# Patient Record
Sex: Male | Born: 1955 | Race: Black or African American | Hispanic: No | Marital: Married | Smoking: Current every day smoker
Health system: Southern US, Community
[De-identification: ages and names within clinical notes are randomized; demographics above are authoritative.]

## PROBLEM LIST (undated history)

## (undated) DIAGNOSIS — G5602 Carpal tunnel syndrome, left upper limb: Secondary | ICD-10-CM

## (undated) DIAGNOSIS — T7840XA Allergy, unspecified, initial encounter: Secondary | ICD-10-CM

## (undated) DIAGNOSIS — S0990XA Unspecified injury of head, initial encounter: Secondary | ICD-10-CM

## (undated) DIAGNOSIS — K219 Gastro-esophageal reflux disease without esophagitis: Secondary | ICD-10-CM

## (undated) DIAGNOSIS — F32A Depression, unspecified: Secondary | ICD-10-CM

## (undated) DIAGNOSIS — R5383 Other fatigue: Secondary | ICD-10-CM

## (undated) DIAGNOSIS — M1990 Unspecified osteoarthritis, unspecified site: Secondary | ICD-10-CM

## (undated) DIAGNOSIS — F1910 Other psychoactive substance abuse, uncomplicated: Secondary | ICD-10-CM

## (undated) DIAGNOSIS — F329 Major depressive disorder, single episode, unspecified: Secondary | ICD-10-CM

## (undated) DIAGNOSIS — M25512 Pain in left shoulder: Secondary | ICD-10-CM

## (undated) DIAGNOSIS — I10 Essential (primary) hypertension: Secondary | ICD-10-CM

## (undated) DIAGNOSIS — S4992XA Unspecified injury of left shoulder and upper arm, initial encounter: Secondary | ICD-10-CM

## (undated) DIAGNOSIS — N189 Chronic kidney disease, unspecified: Secondary | ICD-10-CM

## (undated) HISTORY — DX: Other fatigue: R53.83

## (undated) HISTORY — DX: Gastro-esophageal reflux disease without esophagitis: K21.9

## (undated) HISTORY — DX: Carpal tunnel syndrome, left upper limb: G56.02

## (undated) HISTORY — DX: Chronic kidney disease, unspecified: N18.9

## (undated) HISTORY — DX: Unspecified injury of left shoulder and upper arm, initial encounter: S49.92XA

## (undated) HISTORY — DX: Depression, unspecified: F32.A

## (undated) HISTORY — DX: Unspecified injury of head, initial encounter: S09.90XA

## (undated) HISTORY — DX: Major depressive disorder, single episode, unspecified: F32.9

## (undated) HISTORY — DX: Allergy, unspecified, initial encounter: T78.40XA

## (undated) HISTORY — DX: Other psychoactive substance abuse, uncomplicated: F19.10

## (undated) HISTORY — DX: Pain in left shoulder: M25.512

## (undated) HISTORY — DX: Unspecified osteoarthritis, unspecified site: M19.90

## (undated) HISTORY — DX: Essential (primary) hypertension: I10

## (undated) HISTORY — PX: POLYPECTOMY: SHX149

---

## 2001-10-03 ENCOUNTER — Emergency Department (HOSPITAL_COMMUNITY): Admission: EM | Admit: 2001-10-03 | Discharge: 2001-10-03 | Payer: Self-pay | Admitting: Emergency Medicine

## 2002-11-09 ENCOUNTER — Emergency Department (HOSPITAL_COMMUNITY): Admission: EM | Admit: 2002-11-09 | Discharge: 2002-11-09 | Payer: Self-pay | Admitting: Emergency Medicine

## 2003-02-12 ENCOUNTER — Encounter: Payer: Self-pay | Admitting: Emergency Medicine

## 2003-02-12 ENCOUNTER — Emergency Department (HOSPITAL_COMMUNITY): Admission: EM | Admit: 2003-02-12 | Discharge: 2003-02-12 | Payer: Self-pay | Admitting: Emergency Medicine

## 2003-04-24 ENCOUNTER — Encounter: Admission: RE | Admit: 2003-04-24 | Discharge: 2003-04-24 | Payer: Self-pay | Admitting: Internal Medicine

## 2003-06-24 ENCOUNTER — Encounter: Admission: RE | Admit: 2003-06-24 | Discharge: 2003-06-24 | Payer: Self-pay | Admitting: Internal Medicine

## 2003-11-11 ENCOUNTER — Encounter: Admission: RE | Admit: 2003-11-11 | Discharge: 2003-11-11 | Payer: Self-pay | Admitting: Internal Medicine

## 2004-03-23 ENCOUNTER — Encounter: Admission: RE | Admit: 2004-03-23 | Discharge: 2004-03-23 | Payer: Self-pay | Admitting: Internal Medicine

## 2004-03-25 ENCOUNTER — Encounter: Admission: RE | Admit: 2004-03-25 | Discharge: 2004-03-25 | Payer: Self-pay | Admitting: Internal Medicine

## 2006-05-31 ENCOUNTER — Ambulatory Visit: Payer: Self-pay | Admitting: Hospitalist

## 2006-07-26 ENCOUNTER — Ambulatory Visit: Payer: Self-pay | Admitting: Hospitalist

## 2007-10-18 ENCOUNTER — Emergency Department (HOSPITAL_COMMUNITY): Admission: EM | Admit: 2007-10-18 | Discharge: 2007-10-18 | Payer: Self-pay | Admitting: Emergency Medicine

## 2007-10-23 ENCOUNTER — Ambulatory Visit: Payer: Self-pay | Admitting: Hospitalist

## 2007-10-23 DIAGNOSIS — K219 Gastro-esophageal reflux disease without esophagitis: Secondary | ICD-10-CM

## 2007-10-23 DIAGNOSIS — I10 Essential (primary) hypertension: Secondary | ICD-10-CM

## 2007-10-26 LAB — CONVERTED CEMR LAB
OCCULT 1: NEGATIVE
OCCULT 1: NEGATIVE

## 2007-10-27 LAB — CONVERTED CEMR LAB: OCCULT 3: NEGATIVE

## 2007-10-28 LAB — CONVERTED CEMR LAB
OCCULT 2: NEGATIVE
OCCULT 2: NEGATIVE

## 2007-10-29 ENCOUNTER — Ambulatory Visit: Payer: Self-pay | Admitting: Hospitalist

## 2008-01-17 ENCOUNTER — Ambulatory Visit: Payer: Self-pay | Admitting: Hospitalist

## 2008-01-17 DIAGNOSIS — F1910 Other psychoactive substance abuse, uncomplicated: Secondary | ICD-10-CM

## 2008-01-17 DIAGNOSIS — F329 Major depressive disorder, single episode, unspecified: Secondary | ICD-10-CM

## 2008-01-17 DIAGNOSIS — J309 Allergic rhinitis, unspecified: Secondary | ICD-10-CM | POA: Insufficient documentation

## 2008-02-25 ENCOUNTER — Telehealth (INDEPENDENT_AMBULATORY_CARE_PROVIDER_SITE_OTHER): Payer: Self-pay | Admitting: *Deleted

## 2008-02-26 ENCOUNTER — Encounter (INDEPENDENT_AMBULATORY_CARE_PROVIDER_SITE_OTHER): Payer: Self-pay | Admitting: Hospitalist

## 2008-08-25 ENCOUNTER — Encounter (INDEPENDENT_AMBULATORY_CARE_PROVIDER_SITE_OTHER): Payer: Self-pay | Admitting: *Deleted

## 2008-08-25 ENCOUNTER — Ambulatory Visit: Payer: Self-pay | Admitting: Internal Medicine

## 2008-08-26 LAB — CONVERTED CEMR LAB
ALT: 30 units/L (ref 0–53)
AST: 34 units/L (ref 0–37)
Chloride: 108 meq/L (ref 96–112)
Creatinine, Ser: 1.6 mg/dL — ABNORMAL HIGH (ref 0.40–1.50)
Total Bilirubin: 0.6 mg/dL (ref 0.3–1.2)
Total CHOL/HDL Ratio: 1.8
VLDL: 26 mg/dL (ref 0–40)

## 2008-09-21 ENCOUNTER — Ambulatory Visit: Payer: Self-pay | Admitting: Internal Medicine

## 2008-09-21 ENCOUNTER — Encounter: Payer: Self-pay | Admitting: Internal Medicine

## 2008-09-21 DIAGNOSIS — N182 Chronic kidney disease, stage 2 (mild): Secondary | ICD-10-CM | POA: Insufficient documentation

## 2008-09-24 LAB — CONVERTED CEMR LAB
Hemoglobin, Urine: NEGATIVE
Leukocytes, UA: NEGATIVE
Nitrite: NEGATIVE
PSA: 2.31 ng/mL (ref 0.10–4.00)
Potassium: 4.8 meq/L (ref 3.5–5.3)
Sodium: 141 meq/L (ref 135–145)
Specific Gravity, Urine: 1.023 (ref 1.005–1.03)
pH: 5.5 (ref 5.0–8.0)

## 2008-10-15 ENCOUNTER — Encounter: Payer: Self-pay | Admitting: Licensed Clinical Social Worker

## 2008-10-15 ENCOUNTER — Encounter (INDEPENDENT_AMBULATORY_CARE_PROVIDER_SITE_OTHER): Payer: Self-pay | Admitting: *Deleted

## 2008-10-15 ENCOUNTER — Ambulatory Visit: Payer: Self-pay | Admitting: *Deleted

## 2008-10-15 ENCOUNTER — Encounter (INDEPENDENT_AMBULATORY_CARE_PROVIDER_SITE_OTHER): Payer: Self-pay | Admitting: Internal Medicine

## 2008-10-15 DIAGNOSIS — M25519 Pain in unspecified shoulder: Secondary | ICD-10-CM

## 2008-10-15 LAB — CONVERTED CEMR LAB: Calcium, Total (PTH): 10 mg/dL (ref 8.4–10.5)

## 2008-10-19 LAB — CONVERTED CEMR LAB
Anti Nuclear Antibody(ANA): NEGATIVE
BUN: 16 mg/dL (ref 6–23)
Benzodiazepines.: NEGATIVE
CO2: 25 meq/L (ref 19–32)
Creatinine, Ser: 1.56 mg/dL — ABNORMAL HIGH (ref 0.40–1.50)
Creatinine,U: 319.3 mg/dL
HCT: 45.8 % (ref 39.0–52.0)
MCHC: 32.5 g/dL (ref 30.0–36.0)
MCV: 86.6 fL (ref 78.0–100.0)
Microalb, Ur: 1.14 mg/dL (ref 0.00–1.89)
Nitrite: NEGATIVE
Phencyclidine (PCP): NEGATIVE
Propoxyphene: NEGATIVE
RBC: 5.29 M/uL (ref 4.22–5.81)
Sed Rate: 9 mm/hr (ref 0–16)
Specific Gravity, Urine: 1.024 (ref 1.005–1.03)
Urobilinogen, UA: 0.2 (ref 0.0–1.0)
Vit D, 1,25-Dihydroxy: 23 — ABNORMAL LOW (ref 30–89)
pH: 5.5 (ref 5.0–8.0)

## 2009-01-05 ENCOUNTER — Ambulatory Visit: Payer: Self-pay | Admitting: Internal Medicine

## 2009-01-19 ENCOUNTER — Ambulatory Visit (HOSPITAL_COMMUNITY): Admission: RE | Admit: 2009-01-19 | Discharge: 2009-01-19 | Payer: Self-pay | Admitting: *Deleted

## 2009-01-22 ENCOUNTER — Ambulatory Visit: Payer: Self-pay | Admitting: Internal Medicine

## 2009-04-27 ENCOUNTER — Ambulatory Visit: Payer: Self-pay | Admitting: Infectious Disease

## 2009-04-27 DIAGNOSIS — G5600 Carpal tunnel syndrome, unspecified upper limb: Secondary | ICD-10-CM

## 2009-05-17 ENCOUNTER — Encounter (INDEPENDENT_AMBULATORY_CARE_PROVIDER_SITE_OTHER): Payer: Self-pay | Admitting: *Deleted

## 2009-05-24 ENCOUNTER — Encounter (INDEPENDENT_AMBULATORY_CARE_PROVIDER_SITE_OTHER): Payer: Self-pay | Admitting: *Deleted

## 2009-05-28 ENCOUNTER — Encounter (INDEPENDENT_AMBULATORY_CARE_PROVIDER_SITE_OTHER): Payer: Self-pay | Admitting: *Deleted

## 2009-05-28 ENCOUNTER — Ambulatory Visit: Payer: Self-pay | Admitting: Internal Medicine

## 2009-05-28 LAB — CONVERTED CEMR LAB
GFR calc Af Amer: >60 mL/min (ref >60)
GFR calc non Af Amer: 57 mL/min — ABNORMAL LOW (ref >60)
Potassium: 4.8 meq/L (ref 3.5–5.3)
Sodium: 139 meq/L (ref 135–145)

## 2009-06-07 ENCOUNTER — Ambulatory Visit: Payer: Self-pay | Admitting: Family Medicine

## 2009-06-08 ENCOUNTER — Encounter: Payer: Self-pay | Admitting: Sports Medicine

## 2009-06-28 ENCOUNTER — Telehealth: Payer: Self-pay | Admitting: Internal Medicine

## 2009-07-05 ENCOUNTER — Ambulatory Visit: Payer: Self-pay | Admitting: Family Medicine

## 2009-07-06 ENCOUNTER — Telehealth: Payer: Self-pay | Admitting: Family Medicine

## 2009-07-08 ENCOUNTER — Ambulatory Visit: Payer: Self-pay | Admitting: Internal Medicine

## 2009-07-08 ENCOUNTER — Encounter: Payer: Self-pay | Admitting: Internal Medicine

## 2009-07-08 LAB — CONVERTED CEMR LAB
Chloride: 105 meq/L (ref 96–112)
Creatinine, Ser: 1.29 mg/dL (ref 0.40–1.50)
HDL: 63 mg/dL (ref >39)
LDL Cholesterol: 96 mg/dL (ref 0–99)
Potassium: 5.1 meq/L (ref 3.5–5.3)
Sodium: 139 meq/L (ref 135–145)
Total CHOL/HDL Ratio: 2.8
Triglycerides: 98 mg/dL (ref <150)

## 2009-07-30 ENCOUNTER — Ambulatory Visit: Payer: Self-pay | Admitting: Gastroenterology

## 2009-07-30 ENCOUNTER — Encounter: Payer: Self-pay | Admitting: Internal Medicine

## 2009-08-02 ENCOUNTER — Ambulatory Visit: Payer: Self-pay | Admitting: Family Medicine

## 2009-08-04 ENCOUNTER — Telehealth: Payer: Self-pay | Admitting: *Deleted

## 2009-08-09 ENCOUNTER — Telehealth: Payer: Self-pay | Admitting: Gastroenterology

## 2009-08-11 ENCOUNTER — Ambulatory Visit: Payer: Self-pay | Admitting: Gastroenterology

## 2009-08-11 ENCOUNTER — Encounter: Payer: Self-pay | Admitting: Gastroenterology

## 2009-08-11 HISTORY — PX: COLONOSCOPY: SHX174

## 2009-08-11 LAB — HM COLONOSCOPY

## 2009-08-13 ENCOUNTER — Encounter: Payer: Self-pay | Admitting: Gastroenterology

## 2009-08-25 ENCOUNTER — Ambulatory Visit: Payer: Self-pay | Admitting: Internal Medicine

## 2009-08-25 DIAGNOSIS — M549 Dorsalgia, unspecified: Secondary | ICD-10-CM | POA: Insufficient documentation

## 2009-08-25 LAB — CONVERTED CEMR LAB
CO2: 23 meq/L (ref 19–32)
Calcium: 9.6 mg/dL (ref 8.4–10.5)
Creatinine, Ser: 1.19 mg/dL (ref 0.40–1.50)

## 2009-10-28 ENCOUNTER — Ambulatory Visit: Payer: Self-pay | Admitting: Internal Medicine

## 2010-01-18 ENCOUNTER — Ambulatory Visit: Payer: Self-pay | Admitting: Internal Medicine

## 2010-01-18 LAB — CONVERTED CEMR LAB
ALT: 10 units/L (ref 0–53)
AST: 16 units/L (ref 0–37)
Calcium: 9.8 mg/dL (ref 8.4–10.5)
Chloride: 105 meq/L (ref 96–112)
Creatinine, Ser: 1.24 mg/dL (ref 0.40–1.50)
Total Bilirubin: 0.5 mg/dL (ref 0.3–1.2)

## 2010-02-01 ENCOUNTER — Ambulatory Visit: Payer: Self-pay | Admitting: Internal Medicine

## 2010-02-01 DIAGNOSIS — K029 Dental caries, unspecified: Secondary | ICD-10-CM | POA: Insufficient documentation

## 2010-03-21 ENCOUNTER — Telehealth: Payer: Self-pay | Admitting: Internal Medicine

## 2010-03-29 ENCOUNTER — Telehealth: Payer: Self-pay | Admitting: Internal Medicine

## 2010-06-15 ENCOUNTER — Ambulatory Visit: Payer: Self-pay | Admitting: Internal Medicine

## 2010-08-01 IMAGING — US US RENAL
1 series · 14 of 25 positions shown · non-contrast
Comparison: 10/18/2007

CLINICAL DATA: Renal insufficiency

RENAL/URINARY TRACT ULTRASOUND
TECHNIQUE: Complete ultrasound examination of the urinary tract
was performed including evaluation of the kidneys, renal collecting
systems, and urinary bladder.

[Series 1: unknown · 0.32mm/px · 14 of 25 slices shown]
[im 1/25]
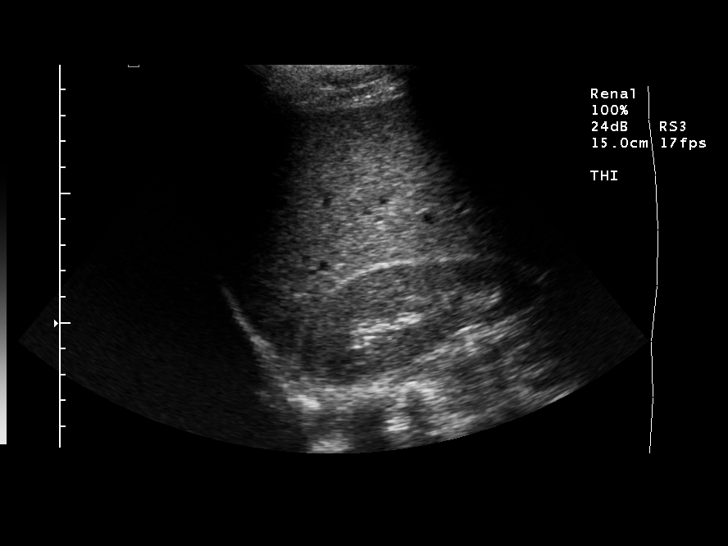
[im 3/25]
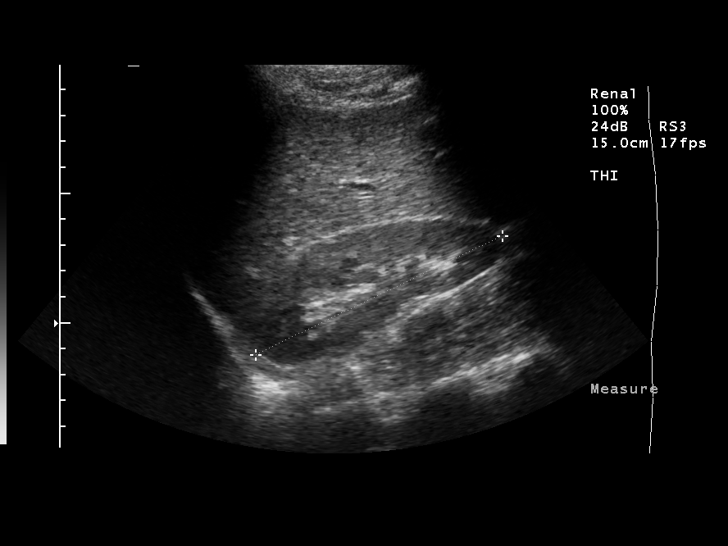
[im 5/25]
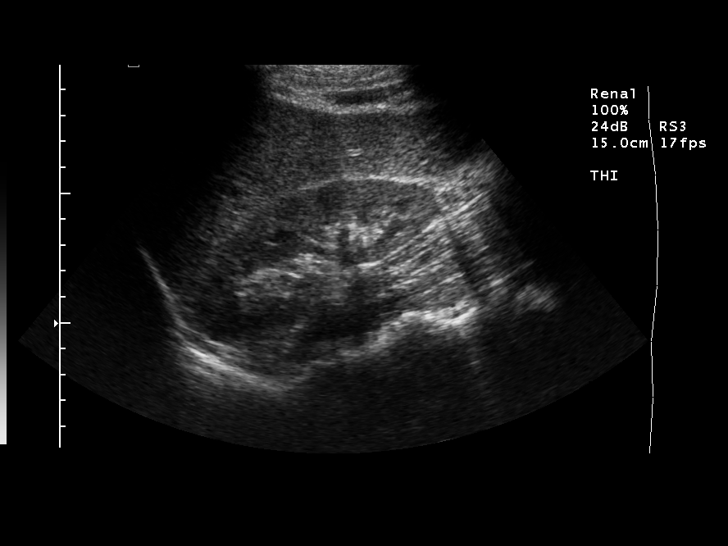
[im 7/25]
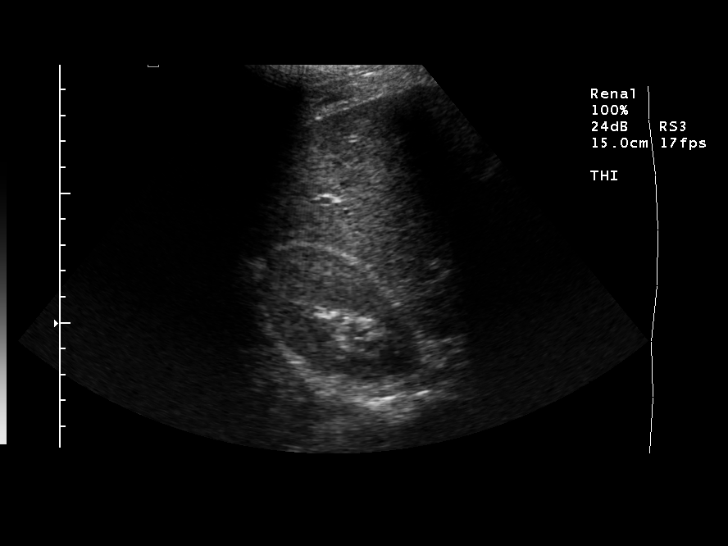
[im 9/25]
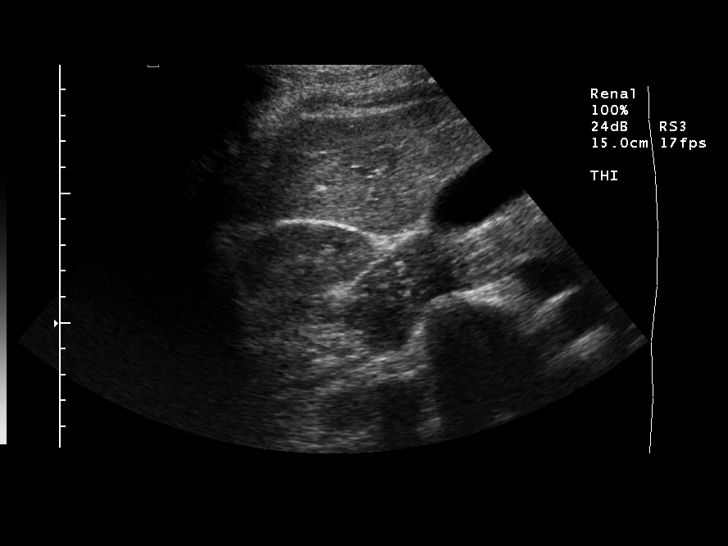
[im 10/25]
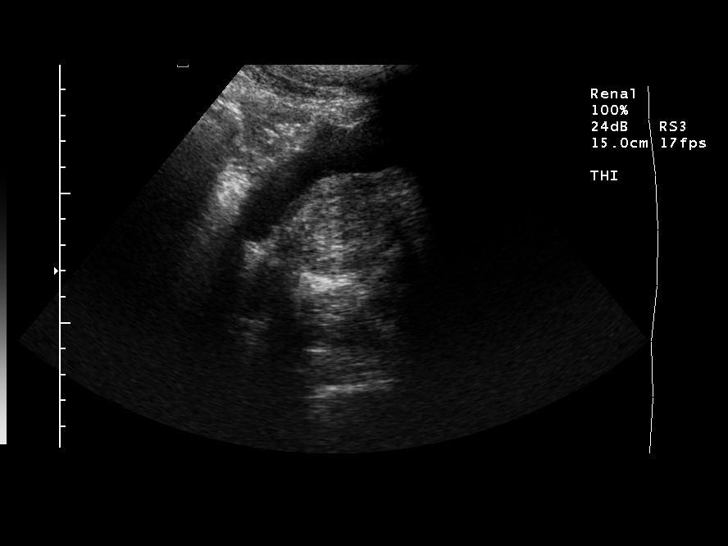
[im 12/25]
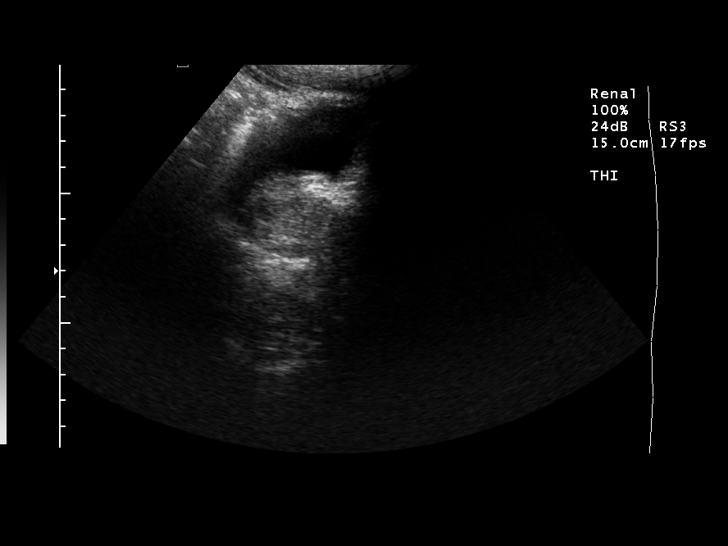
[im 14/25]
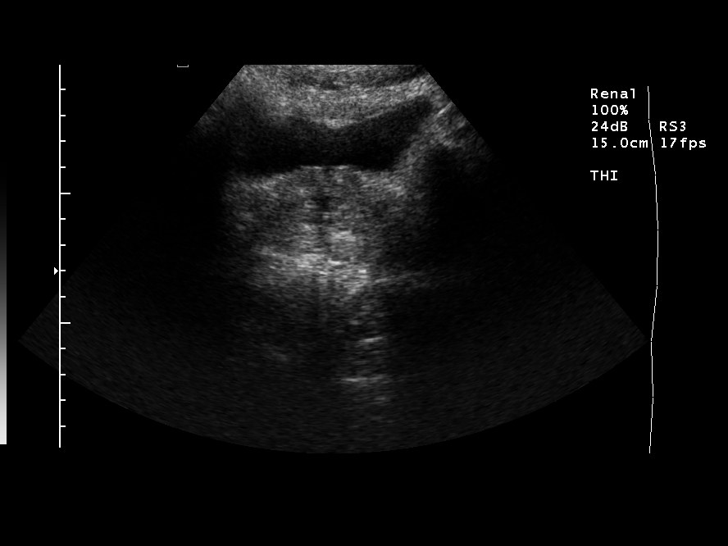
[im 16/25]
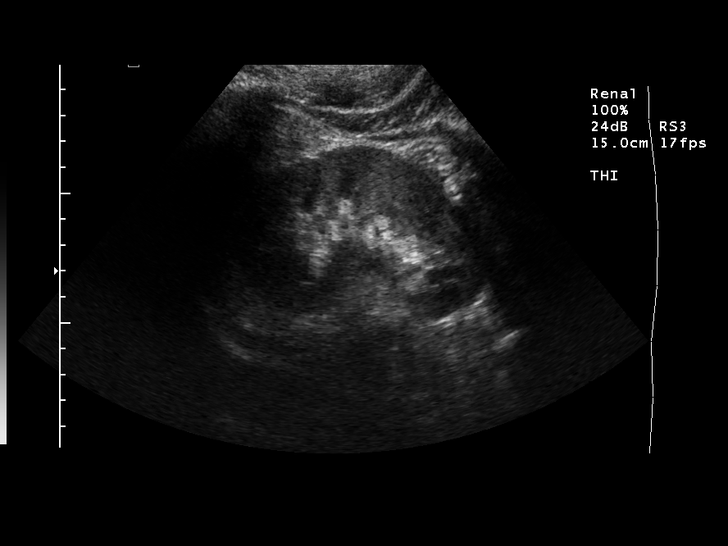
[im 17/25]
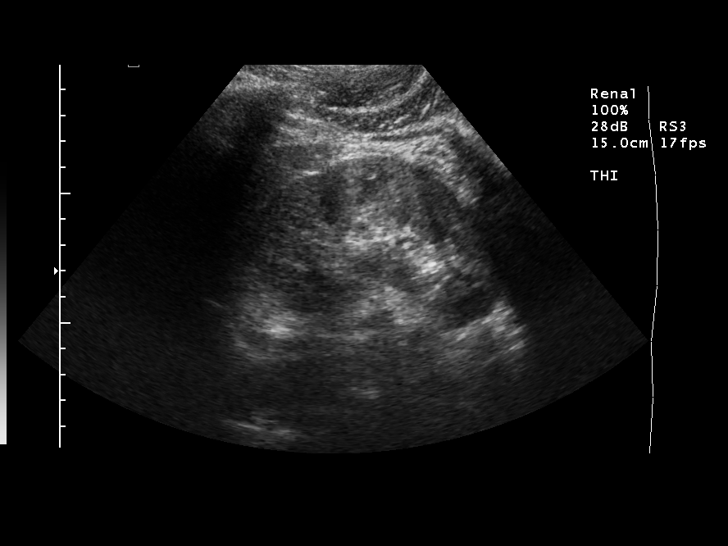
[im 19/25]
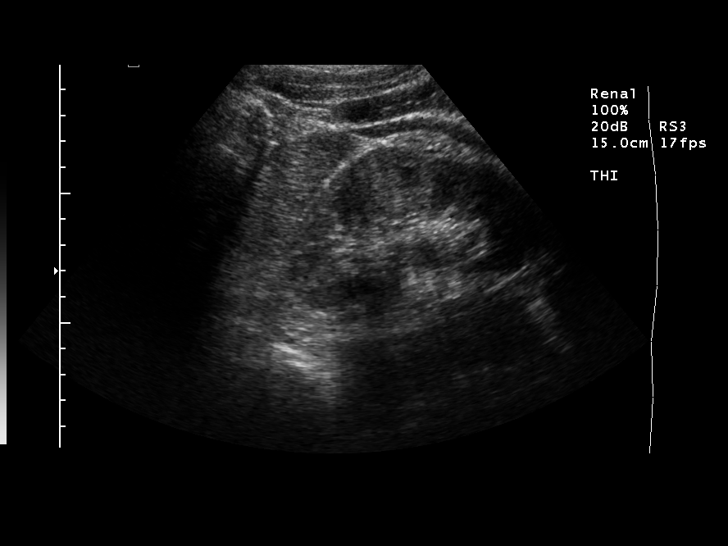
[im 21/25]
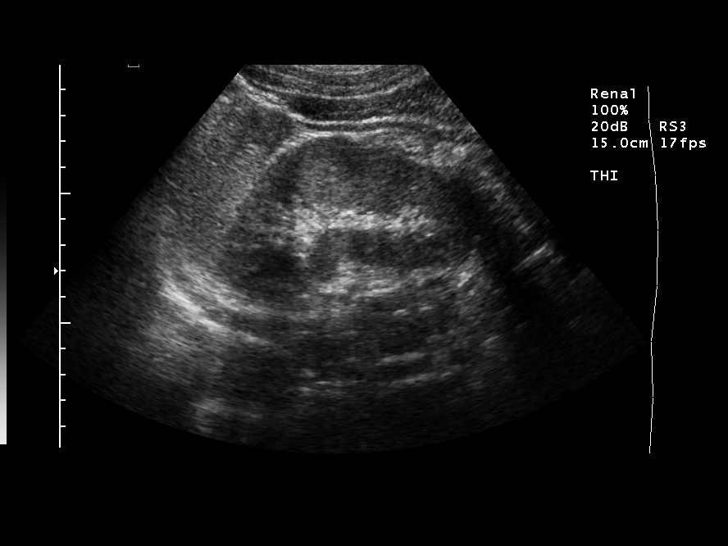
[im 23/25]
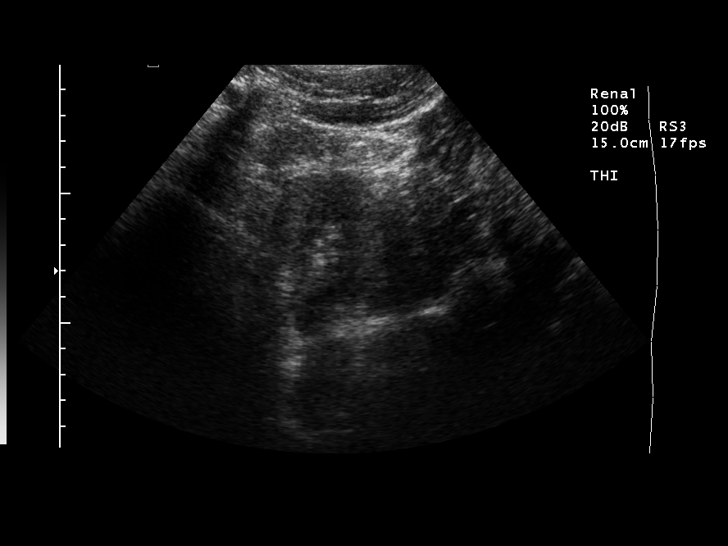
[im 25/25]
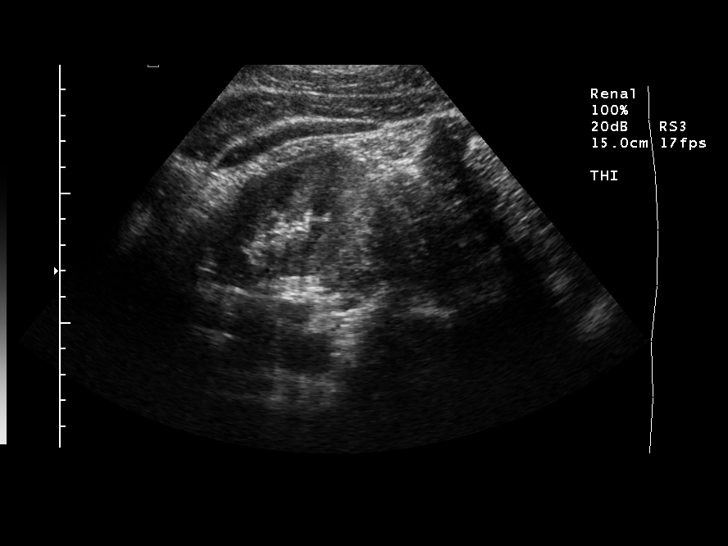

[14 of 25 positions shown; findings below may reference images not displayed]

FINDINGS: The right kidney measures 10.7 cm.  The cortex appears
normal There is no hydronephrosis.

The left kidney measures 10.6 cm.  The cortex appears normal.

 There is no hydronephrosis.

The urinary bladder is normal.

There is prostate gland enlargement.
IMPRESSION: 1.  No evidence of obstructive uropathy to explain the patient's
renal insufficiency

## 2010-09-12 ENCOUNTER — Telehealth: Payer: Self-pay | Admitting: Internal Medicine

## 2010-10-18 ENCOUNTER — Ambulatory Visit: Payer: Self-pay | Admitting: Internal Medicine
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
## 2011-01-10 NOTE — Progress Notes (Signed)
Summary: Refill/gh  Phone Note Refill Request Message from:  Fax from Pharmacy on March 29, 2010 2:16 PM  Refills Requested: Medication #1:  CITALOPRAM HYDROBROMIDE 20 MG TABS Take 1 tablet by mouth once a day   Dosage confirmed as above?Dosage Confirmed   Last Refilled: 02/26/2010  Method Requested: Fax to Local Pharmacy Initial call taken by: Angelina Ok RN,  March 29, 2010 2:16 PM    Prescriptions: CITALOPRAM HYDROBROMIDE 20 MG TABS (CITALOPRAM HYDROBROMIDE) Take 1 tablet by mouth once a day  #30 x 5   Entered and Authorized by:   [name]   Signed by:   [name] on 03/30/2010   Method used:   Faxed to ...       [facility] (retail)       [address] [place], [place]  [address]       Ph: [phone]       Fax: [phone]   RxID:   [id number]

## 2011-01-10 NOTE — Progress Notes (Signed)
Summary: Refill/gh  Phone Note Refill Request Message from:  Fax from Pharmacy on September 12, 2010 11:23 AM  Refills Requested: Medication #1:  GABAPENTIN 300 MG CAPS taper up to 2 in am   Dosage confirmed as above?Dosage Confirmed   Last Refilled: 03/27/2010  Method Requested: Electronic Initial call taken by: Angelina Ok RN,  September 12, 2010 11:23 AM  Follow-up for Phone Call        Rx completed in Dr. Tiajuana Amass Follow-up by: [name],  September 12, 2010 12:38 PM    Prescriptions: GABAPENTIN 300 MG CAPS (GABAPENTIN) taper up to 2 in am, 2 at lunch and 2 at night patient has written taper  #180 x 11   Entered and Authorized by:   [name]   Signed by:   [name] on 09/12/2010   Method used:   Faxed to ...       [facility] (retail)       [address] [place], [place]  [address]       Ph: [phone]       Fax: [phone]   RxID:   [id number]
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
## 2011-01-10 NOTE — Progress Notes (Signed)
Summary: refill/gg  Phone Note Refill Request  on March 21, 2010 1:50 PM  Refills Requested: Medication #1:  ACCUPRIL 10 MG TABS Take 1 tablet by mouth once a day   Dosage confirmed as above?Dosage Confirmed   Last Refilled: 12/14/2009  Method Requested: Fax to Local Pharmacy Initial call taken by: Merrie Roof RN,  March 21, 2010 1:50 PM    Prescriptions: ACCUPRIL 10 MG TABS (QUINAPRIL HCL) Take 1 tablet by mouth once a day  #30 x 11   Entered and Authorized by:   [name]   Signed by:   [name] on 03/21/2010   Method used:   Faxed to ...       [facility] (retail)       [address] [place], [place]  [address]       Ph: [phone]       Fax: [phone]   RxID:   [id number]

## 2011-02-14 ENCOUNTER — Encounter: Payer: Self-pay | Admitting: Internal Medicine

## 2011-02-14 ENCOUNTER — Ambulatory Visit (INDEPENDENT_AMBULATORY_CARE_PROVIDER_SITE_OTHER): Payer: Self-pay | Admitting: Internal Medicine

## 2011-02-14 DIAGNOSIS — I10 Essential (primary) hypertension: Secondary | ICD-10-CM

## 2011-02-14 DIAGNOSIS — H538 Other visual disturbances: Secondary | ICD-10-CM

## 2011-02-14 DIAGNOSIS — N259 Disorder resulting from impaired renal tubular function, unspecified: Secondary | ICD-10-CM

## 2011-02-14 DIAGNOSIS — G5600 Carpal tunnel syndrome, unspecified upper limb: Secondary | ICD-10-CM

## 2011-02-14 LAB — BASIC METABOLIC PANEL
Chloride: 105 mEq/L (ref 96–112)
Potassium: 4.2 mEq/L (ref 3.5–5.3)

## 2011-02-14 MED ORDER — TRAMADOL HCL 50 MG PO TABS: 50.0000 mg | ORAL_TABLET | Freq: Four times a day (QID) | ORAL | Status: DC | PRN

## 2011-02-14 MED ORDER — ACETAMINOPHEN ER 650 MG PO TBCR: 650.0000 mg | EXTENDED_RELEASE_TABLET | Freq: Three times a day (TID) | ORAL | Status: DC | PRN

## 2011-02-14 NOTE — Assessment & Plan Note (Addendum)
Likely due to unfit glasses and he has no other eye symptoms. Will have eye referral to help him to get the right glasses or find out other eye problems.

## 2011-02-14 NOTE — Patient Instructions (Signed)
Please continue to use wrist brace to help your left wrist and take pain medications.  Please stop smoking and it will help your cough and health.

## 2011-02-14 NOTE — Progress Notes (Signed)
  Subjective:    Patient ID: Derek Patterson, male    DOB: [birth year], 55 y.o.   MRN: [id number]  HPI Pt is a 55 y/o AAM with PMH of HTN, carpal tunnel syndrome and CKD who came here for regular follow-up and wants refill and eye referral. Recently he found his glasses not quite comfortable and seems blurry, no HA, wants to see eye doc for his glasses.  He still has left hand numbness and pain from carpal tunnel syndrome and has seen sports medicine and was given steroid shot once and advised him to [facility] for surgery and pt refused for surgery. The symptom is better when he wears wrist brace. He has not taken tramadol or tylenol for several months and needs refill. He also has dry cough for several months after the cold and smokes 1/2 PPD currently. No fever, hemoptysis.  cough and depression. No other c/o, such as melena or abdominal pain, SOB, CP.     Review of Systems  Constitutional: Negative for fever and activity change.  HENT: Negative for nosebleeds and neck pain.   Eyes: Negative for pain.  Respiratory: Positive for cough. Negative for apnea, chest tightness and wheezing.   Cardiovascular: Negative for chest pain, palpitations and leg swelling.  Gastrointestinal: Negative for abdominal distention.  Genitourinary: Negative for dysuria and hematuria.  Musculoskeletal: Positive for myalgias.  Neurological: Negative for dizziness, weakness, numbness and headaches.       Objective:   Physical Exam  Constitutional: He is oriented to person, place, and time. He appears well-developed and well-nourished.  HENT:  Head: Normocephalic and atraumatic.  Eyes: Conjunctivae and EOM are normal. Pupils are equal, round, and reactive to light.  Neck: Normal range of motion. Neck supple. No JVD present.  Cardiovascular: Normal rate, regular rhythm, normal heart sounds and intact distal pulses.   No murmur heard. Pulmonary/Chest: Effort normal and breath sounds normal. No respiratory distress. He has  no wheezes. He exhibits no tenderness.  Abdominal: Soft. Bowel sounds are normal. He exhibits no distension. There is no tenderness. There is no rebound and no guarding.  Musculoskeletal: Normal range of motion. He exhibits no edema and no tenderness.       Left hand radial side of his fingers has decreased sensation.   Lymphadenopathy:    He has no cervical adenopathy.  Neurological: He is alert and oriented to person, place, and time. He has normal reflexes.          Assessment & Plan:

## 2011-02-14 NOTE — Assessment & Plan Note (Signed)
BP well controlled with ACEIs, and will check BMET. Recheck BP at next visit.

## 2011-02-14 NOTE — Assessment & Plan Note (Addendum)
His Cr baseline 1.2-1.5 and on ACEI now, will recheck BMET for Cr since this has not been checked for one year.

## 2011-02-14 NOTE — Assessment & Plan Note (Addendum)
He has seen sports medicine and was given steroid shot once, but did not help much for his pain, so they advised him to [facility] for surgery, but pt refused for surgery. He has not taken tramadol or tylenol for several months and needs refill. better when he wears wrist brace. Neurontin also did not help too much. Will stop neurontin and refill tramadol and tylenol. Advised him to use wrist brace.

## 2011-02-20 ENCOUNTER — Encounter: Payer: Self-pay | Admitting: *Deleted

## 2011-03-24 ENCOUNTER — Other Ambulatory Visit: Payer: Self-pay | Admitting: *Deleted

## 2011-03-24 DIAGNOSIS — I10 Essential (primary) hypertension: Secondary | ICD-10-CM

## 2011-03-24 MED ORDER — QUINAPRIL HCL 10 MG PO TABS: 10.0000 mg | ORAL_TABLET | Freq: Every day | ORAL | Status: DC

## 2011-07-01 ENCOUNTER — Encounter: Payer: Self-pay | Admitting: Internal Medicine

## 2011-07-01 ENCOUNTER — Encounter: Payer: Self-pay | Admitting: *Deleted

## 2011-07-13 ENCOUNTER — Ambulatory Visit (INDEPENDENT_AMBULATORY_CARE_PROVIDER_SITE_OTHER): Payer: Self-pay | Admitting: Internal Medicine

## 2011-07-13 ENCOUNTER — Encounter: Payer: Self-pay | Admitting: Internal Medicine

## 2011-07-13 DIAGNOSIS — Z720 Tobacco use: Secondary | ICD-10-CM

## 2011-07-13 DIAGNOSIS — F17200 Nicotine dependence, unspecified, uncomplicated: Secondary | ICD-10-CM

## 2011-07-13 DIAGNOSIS — K219 Gastro-esophageal reflux disease without esophagitis: Secondary | ICD-10-CM

## 2011-07-13 DIAGNOSIS — I10 Essential (primary) hypertension: Secondary | ICD-10-CM

## 2011-07-13 MED ORDER — FAMOTIDINE 20 MG PO TABS: 20.0000 mg | ORAL_TABLET | Freq: Two times a day (BID) | ORAL | Status: DC

## 2011-07-13 MED ORDER — HYDROCHLOROTHIAZIDE 25 MG PO TABS: 25.0000 mg | ORAL_TABLET | Freq: Every day | ORAL | Status: DC

## 2011-07-13 NOTE — Patient Instructions (Signed)
You were seen today for a cough, and we discussed that it might be caused by your smoking or acid reflux. We will switch your blood pressure medicine to one that is more affordable. Stop taking accupril. Start taking HCTZ (hydrochlorothiazide) 25 mg daily and we will see you back in 2-3 weeks to check that there are no side effects and your blood pressure is still well controlled. Also we are prescribing Pepcid 20 mg to be take twice daily to help your acid reflux. We also discussed smoking and talked about quitting. We will have you talk to Gilford Silvius who counsels people about smoking cessation. As well, calling 1800QUITNOW can help you to get counseling and possibly free resources such as patches to help you quit.  Smoking Cessation This document explains the best ways for you to quit smoking and new treatments to help. It lists new medicines that can double or triple your chances of quitting and quitting for good. It also considers ways to avoid relapses and concerns you may have about quitting, including weight gain. NICOTINE: A POWERFUL ADDICTION If you have tried to quit smoking, you know how hard it can be. It is hard because nicotine is a very addictive drug. For some people, it can be as addictive as heroin or cocaine. Usually, people make 2 or 3 tries, or more, before finally being able to quit. Each time you try to quit, you can learn about what helps and what hurts. Quitting takes hard work and a lot of effort, but you can quit smoking. QUITTING SMOKING IS ONE OF THE MOST IMPORTANT THINGS YOU WILL EVER DO:  You will live longer, feel better, and live better.   The impact on your body of quitting smoking is felt almost immediately:   Within 20 minutes, blood pressure decreases. Pulse returns to its normal level.   After 8 hours, carbon monoxide levels in the blood return to normal. Oxygen level increases.   After 24 hours, chance of heart attack starts to decrease. Breath, hair, and  body stop smelling like smoke.   After 48 hours, damaged nerve endings begin to recover. Sense of taste and smell improve.   After 72 hours, the body is virtually free of nicotine. Bronchial tubes relax and breathing becomes easier.   After 2 to 12 weeks, lungs can hold more air. Exercise becomes easier and circulation improves.   Quitting will lower your chance of having a heart attack, stroke, cancer, or lung disease:   After 1 year, the risk of coronary heart disease is cut in half.   After 5 years, the risk of stroke falls to the same as a nonsmoker.   After 10 years, the risk of lung cancer is cut in half and the risk of other cancers decreases significantly.   After 15 years, the risk of coronary heart disease drops, usually to the level of a nonsmoker.   If you are pregnant, quitting smoking will improve your chances of having a healthy baby.   The people you live with, especially your children, will be healthier.   You will have extra money to spend on things other than cigarettes.  FIVE KEYS TO QUITTING Studies have shown that these 5 steps will help you quit smoking and quit for good. You have the best chances of quitting if you use them together: 1. Get ready.  2. Get support and encouragement.  3. Learn new skills and behaviors.  4. Get medicine to reduce your nicotine addiction  and use it correctly.  5. Be prepared for relapse or difficult situations. Be determined to continue trying to quit, even if you do not succeed at first.  1. GET READY  Set a quit date.   Change your environment.   Get rid of ALL cigarettes, ashtrays, matches, and lighters in your home, car, and place of work.   Do not let people smoke in your home.   Review your past attempts to quit. Think about what worked and what did not.   Once you quit, do not smoke. NOT EVEN A PUFF!  2. GET SUPPORT AND ENCOURAGEMENT Studies have shown that you have a better chance of being successful if you have  help. You can get support in many ways.  Tell your family, friends, and coworkers that you are going to quit and need their support. Ask them not to smoke around you.   Talk to your caregivers (doctor, dentist, nurse, pharmacist, psychologist, and/or smoking counselor).   Get individual, group, or telephone counseling and support. The more counseling you have, the better your chances are of quitting. Programs are available at [employer] and health centers. Call your local health department for information about programs in your area.   Spiritual beliefs and practices may help some smokers quit.   Quit meters are Photographer that keep track of quit statistics, such as amount of "quit-time," cigarettes not smoked, and money saved.   Many smokers find one or more of the many self-help books available useful in helping them quit and stay off tobacco.  3. LEARN NEW SKILLS AND BEHAVIORS  Try to distract yourself from urges to smoke. Talk to someone, go for a walk, or occupy your time with a task.   When you first try to quit, change your routine. Take a different route to work. Drink tea instead of coffee. Eat breakfast in a different place.   Do something to reduce your stress. Take a hot bath, exercise, or read a book.   Plan something enjoyable to do every day. Reward yourself for not smoking.   Explore interactive web-based programs that specialize in helping you quit.  4. GET MEDICINE AND USE IT CORRECTLY Medicines can help you stop smoking and decrease the urge to smoke. Combining medicine with the above behavioral methods and support can quadruple your chances of successfully quitting smoking. The [place] Food and Drug Administration (FDA) has approved 7 medicines to help you quit smoking. These medicines fall into 3 categories.  Nicotine replacement therapy (delivers nicotine to your body without the negative effects and risks of smoking):    Nicotine gum: Available over-the-counter.   Nicotine lozenges: Available over-the-counter.   Nicotine inhaler: Available by prescription.   Nicotine nasal spray: Available by prescription.   Nicotine skin patches (transdermal): Available by prescription and over-the-counter.   Antidepressant medicine (helps people abstain from smoking, but how this works is unknown):   Bupropion sustained-release (SR) tablets: Available by prescription.   Nicotinic receptor partial agonist (simulates the effect of nicotine in your brain):   Varenicline tartrate tablets: Available by prescription.   Ask your caregiver for advice about which medicines to use and how to use them. Carefully read the information on the package.   Everyone who is trying to quit may benefit from using a medicine. If you are pregnant or trying to become pregnant, nursing an infant, you are under age 56, or you smoke fewer than 10 cigarettes per day, talk to  your caregiver before taking any nicotine replacement medicines.   You should stop using a nicotine replacement product and call your caregiver if you experience nausea, dizziness, weakness, vomiting, fast or irregular heartbeat, mouth problems with the lozenge or gum, or redness or swelling of the skin around the patch that does not go away.   Do not use any other product containing nicotine while using a nicotine replacement product.   Talk to your caregiver before using these products if you have diabetes, heart disease, asthma, stomach ulcers, you had a recent heart attack, you have high blood pressure that is not controlled with medicine, a history of irregular heartbeat, or you have been prescribed medicine to help you quit smoking.  5. BE PREPARED FOR RELAPSE OR DIFFICULT SITUATIONS  Most relapses occur within the first 3 months after quitting. Do not be discouraged if you start smoking again. Remember, most people try several times before they finally quit.    You may have symptoms of withdrawal because your body is used to nicotine. You may crave cigarettes, be irritable, feel very hungry, cough often, get headaches, or have difficulty concentrating.   The withdrawal symptoms are only temporary. They are strongest when you first quit, but they will go away within 10 to 14 days.  Here are some difficult situations to watch for:  Alcohol. Avoid drinking alcohol. Drinking lowers your chances of successfully quitting.   Caffeine. Try to reduce the amount of caffeine you consume. It also lowers your chances of successfully quitting.   Other smokers. Being around smoking can make you want to smoke. Avoid smokers.   Weight gain. Many smokers will gain weight when they quit, usually less than 10 pounds. Eat a healthy diet and stay active. Do not let weight gain distract you from your main goal, quitting smoking. Some medicines that help you quit smoking may also help delay weight gain. You can always lose the weight gained after you quit.   Bad mood or depression. There are a lot of ways to improve your mood other than smoking.  If you are having problems with any of these situations, talk to your caregiver. SPECIAL SITUATIONS OR CONDITIONS Studies suggest that everyone can quit smoking. Your situation or condition can give you a special reason to quit.  Pregnant women/New mothers: By quitting, you protect your baby's health and your own.   Hospitalized patients: By quitting, you reduce health problems and help healing.   Heart attack patients: By quitting, you reduce your risk of a second heart attack.   Lung, head, and neck cancer patients: By quitting, you reduce your chance of a second cancer.   Parents of children and adolescents: By quitting, you protect your children from illnesses caused by secondhand smoke.  QUESTIONS TO THINK ABOUT Think about the following questions before you try to stop smoking. You may want to talk about your answers  with your caregiver.  Why do you want to quit?   If you tried to quit in the past, what helped and what did not?   What will be the most difficult situations for you after you quit? How will you plan to handle them?   Who can help you through the tough times? Your family? Friends? Caregiver?   What pleasures do you get from smoking? What ways can you still get pleasure if you quit?  Here are some questions to ask your caregiver:  How can you help me to be successful at quitting?  What medicine do you think would be best for me and how should I take it?   What should I do if I need more help?   What is smoking withdrawal like? How can I get information on withdrawal?  Quitting takes hard work and a lot of effort, but you can quit smoking. FOR MORE INFORMATION Smokefree.gov (http://www.davis-sullivan.com/) provides free, accurate, evidence-based information and professional assistance to help support the immediate and long-term needs of people trying to quit smoking. Document Released: 11/21/2001 Document Re-Released: 05/17/2010 [facility] Patient Information 2011 [place], [place].
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
## 2011-07-14 NOTE — Progress Notes (Signed)
Dr Dorise Hiss discussed Mr Enck with me and I agree with her note and plans for his treatment.

## 2011-07-17 ENCOUNTER — Telehealth: Payer: Self-pay | Admitting: Licensed Clinical Social Worker

## 2011-07-27 ENCOUNTER — Telehealth: Payer: Self-pay | Admitting: Licensed Clinical Social Worker

## 2011-07-28 ENCOUNTER — Encounter: Payer: Self-pay | Admitting: Licensed Clinical Social Worker

## 2011-07-28 NOTE — Telephone Encounter (Signed)
Mailed letter to patient home asking him to call SW.

## 2011-08-03 ENCOUNTER — Encounter: Payer: Self-pay | Admitting: Internal Medicine

## 2011-08-03 ENCOUNTER — Ambulatory Visit (INDEPENDENT_AMBULATORY_CARE_PROVIDER_SITE_OTHER): Payer: Self-pay | Admitting: Internal Medicine

## 2011-08-03 DIAGNOSIS — I10 Essential (primary) hypertension: Secondary | ICD-10-CM

## 2011-08-03 DIAGNOSIS — M25519 Pain in unspecified shoulder: Secondary | ICD-10-CM

## 2011-08-03 DIAGNOSIS — F17200 Nicotine dependence, unspecified, uncomplicated: Secondary | ICD-10-CM | POA: Insufficient documentation

## 2011-08-03 DIAGNOSIS — N259 Disorder resulting from impaired renal tubular function, unspecified: Secondary | ICD-10-CM

## 2011-08-03 DIAGNOSIS — Z720 Tobacco use: Secondary | ICD-10-CM

## 2011-08-03 DIAGNOSIS — G5600 Carpal tunnel syndrome, unspecified upper limb: Secondary | ICD-10-CM

## 2011-08-03 MED ORDER — GABAPENTIN 300 MG PO CAPS: 300.0000 mg | ORAL_CAPSULE | Freq: Two times a day (BID) | ORAL | Status: DC

## 2011-08-03 NOTE — Assessment & Plan Note (Signed)
Patient does take gabapentin for this numbness and tingling at night. Worse on the left than the right. I did prescribe gabapentin 60 tablets. He states he normally takes it only at bedtime but if he has a flare he will take it once in the morning as well. I did advise him just to take it at bedtime unless he has a flare. We'll check back at next visit.

## 2011-08-03 NOTE — Assessment & Plan Note (Signed)
Patient does have a cough that is worse with lying down and worse at night. I have instructed him to take Pepcid 20 mg twice daily, he was instructed to use at last visit and only took it once daily. He was taking in the morning. I did instruct him the most important have her do this is at night. We will followup at next visit in 3 months. If he does have worsening he can call our office.

## 2011-08-03 NOTE — Assessment & Plan Note (Signed)
Last creatinine approximately 1.5. He is now off ACE inhibitor. We will recheck creatinine at next visit in 3 months.

## 2011-08-03 NOTE — Patient Instructions (Signed)
You were seen today for a followup visit for your blood pressure and her cough. I would like you to start taking Pepcid twice a day for your cough. Be sure to take it at night before you go to bed. Stop smoking! This is one of the best things that you can do for your body. Smoking is one thing that can give you a chronic cough. Your blood pressure is good today. We are making no changes to your medicines today. We will see back in 3 months. If you have any questions, or feel you need to be seen sooner please feel free to call our office. Our number is [phone].   Smoking Cessation This document explains the best ways for you to quit smoking and new treatments to help. It lists new medicines that can double or triple your chances of quitting and quitting for good. It also considers ways to avoid relapses and concerns you may have about quitting, including weight gain. NICOTINE: A POWERFUL ADDICTION If you have tried to quit smoking, you know how hard it can be. It is hard because nicotine is a very addictive drug. For some people, it can be as addictive as heroin or cocaine. Usually, people make 2 or 3 tries, or more, before finally being able to quit. Each time you try to quit, you can learn about what helps and what hurts. Quitting takes hard work and a lot of effort, but you can quit smoking. QUITTING SMOKING IS ONE OF THE MOST IMPORTANT THINGS YOU WILL EVER DO:  You will live longer, feel better, and live better.   The impact on your body of quitting smoking is felt almost immediately:   Within 20 minutes, blood pressure decreases. Pulse returns to its normal level.   After 8 hours, carbon monoxide levels in the blood return to normal. Oxygen level increases.   After 24 hours, chance of heart attack starts to decrease. Breath, hair, and body stop smelling like smoke.   After 48 hours, damaged nerve endings begin to recover. Sense of taste and smell improve.   After 72 hours, the body is  virtually free of nicotine. Bronchial tubes relax and breathing becomes easier.   After 2 to 12 weeks, lungs can hold more air. Exercise becomes easier and circulation improves.   Quitting will lower your chance of having a heart attack, stroke, cancer, or lung disease:   After 1 year, the risk of coronary heart disease is cut in half.   After 5 years, the risk of stroke falls to the same as a nonsmoker.   After 10 years, the risk of lung cancer is cut in half and the risk of other cancers decreases significantly.   After 15 years, the risk of coronary heart disease drops, usually to the level of a nonsmoker.   If you are pregnant, quitting smoking will improve your chances of having a healthy baby.   The people you live with, especially your children, will be healthier.   You will have extra money to spend on things other than cigarettes.  FIVE KEYS TO QUITTING Studies have shown that these 5 steps will help you quit smoking and quit for good. You have the best chances of quitting if you use them together: 1. Get ready.  2. Get support and encouragement.  3. Learn new skills and behaviors.  4. Get medicine to reduce your nicotine addiction and use it correctly.  5. Be prepared for relapse or difficult situations. Be  determined to continue trying to quit, even if you do not succeed at first.  1. GET READY  Set a quit date.   Change your environment.   Get rid of ALL cigarettes, ashtrays, matches, and lighters in your home, car, and place of work.   Do not let people smoke in your home.   Review your past attempts to quit. Think about what worked and what did not.   Once you quit, do not smoke. NOT EVEN A PUFF!  2. GET SUPPORT AND ENCOURAGEMENT Studies have shown that you have a better chance of being successful if you have help. You can get support in many ways.  Tell your family, friends, and coworkers that you are going to quit and need their support. Ask them not to smoke  around you.   Talk to your caregivers (doctor, dentist, nurse, pharmacist, psychologist, and/or smoking counselor).   Get individual, group, or telephone counseling and support. The more counseling you have, the better your chances are of quitting. Programs are available at [employer] and health centers. Call your local health department for information about programs in your area.   Spiritual beliefs and practices may help some smokers quit.   Quit meters are Photographer that keep track of quit statistics, such as amount of "quit-time," cigarettes not smoked, and money saved.   Many smokers find one or more of the many self-help books available useful in helping them quit and stay off tobacco.  3. LEARN NEW SKILLS AND BEHAVIORS  Try to distract yourself from urges to smoke. Talk to someone, go for a walk, or occupy your time with a task.   When you first try to quit, change your routine. Take a different route to work. Drink tea instead of coffee. Eat breakfast in a different place.   Do something to reduce your stress. Take a hot bath, exercise, or read a book.   Plan something enjoyable to do every day. Reward yourself for not smoking.   Explore interactive web-based programs that specialize in helping you quit.  4. GET MEDICINE AND USE IT CORRECTLY Medicines can help you stop smoking and decrease the urge to smoke. Combining medicine with the above behavioral methods and support can quadruple your chances of successfully quitting smoking. The [place] Food and Drug Administration (FDA) has approved 7 medicines to help you quit smoking. These medicines fall into 3 categories.  Nicotine replacement therapy (delivers nicotine to your body without the negative effects and risks of smoking):   Nicotine gum: Available over-the-counter.   Nicotine lozenges: Available over-the-counter.   Nicotine inhaler: Available by prescription.   Nicotine nasal  spray: Available by prescription.   Nicotine skin patches (transdermal): Available by prescription and over-the-counter.   Antidepressant medicine (helps people abstain from smoking, but how this works is unknown):   Bupropion sustained-release (SR) tablets: Available by prescription.   Nicotinic receptor partial agonist (simulates the effect of nicotine in your brain):   Varenicline tartrate tablets: Available by prescription.   Ask your caregiver for advice about which medicines to use and how to use them. Carefully read the information on the package.   Everyone who is trying to quit may benefit from using a medicine. If you are pregnant or trying to become pregnant, nursing an infant, you are under age 60, or you smoke fewer than 10 cigarettes per day, talk to your caregiver before taking any nicotine replacement medicines.   You should stop using  a nicotine replacement product and call your caregiver if you experience nausea, dizziness, weakness, vomiting, fast or irregular heartbeat, mouth problems with the lozenge or gum, or redness or swelling of the skin around the patch that does not go away.   Do not use any other product containing nicotine while using a nicotine replacement product.   Talk to your caregiver before using these products if you have diabetes, heart disease, asthma, stomach ulcers, you had a recent heart attack, you have high blood pressure that is not controlled with medicine, a history of irregular heartbeat, or you have been prescribed medicine to help you quit smoking.  5. BE PREPARED FOR RELAPSE OR DIFFICULT SITUATIONS  Most relapses occur within the first 3 months after quitting. Do not be discouraged if you start smoking again. Remember, most people try several times before they finally quit.   You may have symptoms of withdrawal because your body is used to nicotine. You may crave cigarettes, be irritable, feel very hungry, cough often, get headaches, or  have difficulty concentrating.   The withdrawal symptoms are only temporary. They are strongest when you first quit, but they will go away within 10 to 14 days.  Here are some difficult situations to watch for:  Alcohol. Avoid drinking alcohol. Drinking lowers your chances of successfully quitting.   Caffeine. Try to reduce the amount of caffeine you consume. It also lowers your chances of successfully quitting.   Other smokers. Being around smoking can make you want to smoke. Avoid smokers.   Weight gain. Many smokers will gain weight when they quit, usually less than 10 pounds. Eat a healthy diet and stay active. Do not let weight gain distract you from your main goal, quitting smoking. Some medicines that help you quit smoking may also help delay weight gain. You can always lose the weight gained after you quit.   Bad mood or depression. There are a lot of ways to improve your mood other than smoking.  If you are having problems with any of these situations, talk to your caregiver. SPECIAL SITUATIONS OR CONDITIONS Studies suggest that everyone can quit smoking. Your situation or condition can give you a special reason to quit.  Pregnant women/New mothers: By quitting, you protect your baby's health and your own.   Hospitalized patients: By quitting, you reduce health problems and help healing.   Heart attack patients: By quitting, you reduce your risk of a second heart attack.   Lung, head, and neck cancer patients: By quitting, you reduce your chance of a second cancer.   Parents of children and adolescents: By quitting, you protect your children from illnesses caused by secondhand smoke.  QUESTIONS TO THINK ABOUT Think about the following questions before you try to stop smoking. You may want to talk about your answers with your caregiver.  Why do you want to quit?   If you tried to quit in the past, what helped and what did not?   What will be the most difficult situations for  you after you quit? How will you plan to handle them?   Who can help you through the tough times? Your family? Friends? Caregiver?   What pleasures do you get from smoking? What ways can you still get pleasure if you quit?  Here are some questions to ask your caregiver:  How can you help me to be successful at quitting?   What medicine do you think would be best for me and how should I  take it?   What should I do if I need more help?   What is smoking withdrawal like? How can I get information on withdrawal?  Quitting takes hard work and a lot of effort, but you can quit smoking. FOR MORE INFORMATION Smokefree.gov (http://www.davis-sullivan.com/) provides free, accurate, evidence-based information and professional assistance to help support the immediate and long-term needs of people trying to quit smoking. Document Released: 11/21/2001 Document Re-Released: 05/17/2010 [facility] Patient Information 2011 [place], [place].

## 2011-08-03 NOTE — Progress Notes (Signed)
Subjective:    Patient ID: Derek Patterson, male    DOB: 03/15/1956, 55 y.o.   MRN: [id number]  HPI: The patient is a 55 year old male who comes in today for a followup for blood pressure check and for checkup of his cough. We did switch his medication from Accupril to HCTZ 25 mg at last visit. Patient states he has had no side effects from this medication. I did instruct him to take Pepcid 20 mg twice daily since last visit to help with his cough. He states he has only been taking it once daily in the morning. I did instruct him that if he is only going to take that once daily he should take it at nighttime. His cough is worse at nighttime. His cough is also worse with lying down. He's had no fevers or chills. He is not taking aspirin daily. He has not taken tramadol and along time. He does take gabapentin once before bedtime. He states he got this from sports medicine in the past. He takes for his carpal tunnel syndrome. He has no other complaints at today's visit. He is still having the cough, he is still smoking. He is smoking about half pack a day. I did instruct him that he should quit.    Review of Systems  Constitutional: Negative.   HENT: Negative.   Eyes: Negative.   Respiratory: Positive for cough. Negative for choking, chest tightness, shortness of breath, wheezing and stridor.        Patient does have cough that is worse at night, or when he is lying down. He is a smoker.  Cardiovascular: Negative for chest pain, palpitations and leg swelling.  Gastrointestinal: Negative for nausea, vomiting, abdominal pain, diarrhea and constipation.  Musculoskeletal: Negative.        Patient does have some carpal tunnel syndrome in his left hand worse than his right hand. He has numbness and tingling at night.  Skin: Negative.   Neurological: Negative.   Hematological: Negative.   Psychiatric/Behavioral: Negative.     Vitals: BP: 137/88 Pulse: 84 Temperature: 96.13F Height: 5 feet 11  inches Weight: 172 pounds    Objective:   Physical Exam  Constitutional: He is oriented to person, place, and time. He appears well-developed and well-nourished.  HENT:  Head: Normocephalic and atraumatic.  Eyes: EOM are normal. Pupils are equal, round, and reactive to light.  Neck: Normal range of motion. Neck supple. No tracheal deviation present. No thyromegaly present.  Cardiovascular: Normal rate, regular rhythm and normal heart sounds.   Pulmonary/Chest: Effort normal and breath sounds normal. No respiratory distress. He has no wheezes. He has no rales.  Abdominal: Soft. Bowel sounds are normal. He exhibits no distension and no mass. There is no tenderness. There is no rebound and no guarding.  Musculoskeletal: Normal range of motion. He exhibits no edema and no tenderness.  Lymphadenopathy:    He has no cervical adenopathy.  Neurological: He is alert and oriented to person, place, and time. No cranial nerve deficit.  Skin: Skin is warm and dry. No rash noted. No erythema. No pallor.  Psychiatric: He has a normal mood and affect. His behavior is normal. Judgment and thought content normal.          Assessment & Plan:  1. Please see problem oriented charting  2. Disposition-patient will be seen back in 3 months for followup visit. I've instructed him if he needs any refills or wishes to be seen before then he should call  our office. If he has worsening of the cough he is free to call our office. I instructed him to take Pepcid twice daily, stop smoking, continue taking his blood pressure medicine. I did refill his gabapentin for 300 mg twice daily. However I have instructed him to take it once daily and less the pain is flaring up in his wrists then he can take it twice daily.

## 2011-08-03 NOTE — Assessment & Plan Note (Signed)
Patient is currently half pack per day smoker. I did discuss with him the need to quit. I did discuss this might be the etiology of his chronic cough. He will think about quitting. I did give him information regarding cessation.

## 2011-08-03 NOTE — Assessment & Plan Note (Signed)
Patient was switch from Accupril to HCTZ at last visit. Blood pressure is still well controlled. But pressure today 137/88. We'll continue with current medication. We'll see him back in 3 months. No side effects from medication.

## 2011-09-19 LAB — DIFFERENTIAL
Basophils Absolute: 0
Basophils Relative: 1
Monocytes Relative: 7
Neutro Abs: 3.5
Neutrophils Relative %: 57

## 2011-09-19 LAB — COMPREHENSIVE METABOLIC PANEL
ALT: 18
AST: 25
Albumin: 3.6
CO2: 28
Chloride: 103
GFR calc Af Amer: >60
GFR calc non Af Amer: 55 — ABNORMAL LOW
Sodium: 139
Total Bilirubin: 0.7

## 2011-09-19 LAB — URINALYSIS, ROUTINE W REFLEX MICROSCOPIC
Nitrite: NEGATIVE
Protein, ur: NEGATIVE
Urobilinogen, UA: 0.2

## 2011-09-19 LAB — CBC
Platelets: 356
RBC: 4.77
WBC: 6.1

## 2011-09-19 LAB — LIPASE, BLOOD: Lipase: 21

## 2011-10-04 ENCOUNTER — Telehealth: Payer: Self-pay | Admitting: *Deleted

## 2011-10-04 NOTE — Telephone Encounter (Signed)
CALLED AND SPOKE WITH MR Stiger, HE SAID HE NEVER CALLED [facility] TO MAKE HIS APPT FOR EYE EXAM.  EVERYTIME HE THINKS ABOUT IT HE FORGETS.  TOLD PATIENT TO PLEASE CALL AND MAKE THAT APPT. TO GET HIS EYES DONE.  INSTRUCTED PATIENT TO CALL [facility] WHEN HE GETS EXAM DONE AND HAVE THEM TO FAX REPORT TO [place].  Kilo Eshelman STURDIVANTN NTII  10-24-012  3:08PM

## 2011-10-04 NOTE — Progress Notes (Signed)
Addended by: Neomia Dear on: 10/04/2011 06:16 PM   Modules accepted: Orders

## 2011-12-01 ENCOUNTER — Ambulatory Visit (INDEPENDENT_AMBULATORY_CARE_PROVIDER_SITE_OTHER): Payer: Self-pay | Admitting: Internal Medicine

## 2011-12-01 ENCOUNTER — Encounter: Payer: Self-pay | Admitting: Internal Medicine

## 2011-12-01 VITALS — BP 110/70 | HR 65 | Temp 97.3°F | Ht 71.0 in | Wt 173.3 lb

## 2011-12-01 DIAGNOSIS — F17200 Nicotine dependence, unspecified, uncomplicated: Secondary | ICD-10-CM

## 2011-12-01 DIAGNOSIS — N259 Disorder resulting from impaired renal tubular function, unspecified: Secondary | ICD-10-CM

## 2011-12-01 DIAGNOSIS — Z720 Tobacco use: Secondary | ICD-10-CM

## 2011-12-01 DIAGNOSIS — I10 Essential (primary) hypertension: Secondary | ICD-10-CM

## 2011-12-01 MED ORDER — GABAPENTIN 300 MG PO CAPS: 300.0000 mg | ORAL_CAPSULE | Freq: Two times a day (BID) | ORAL | Status: DC

## 2011-12-01 MED ORDER — HYDROCHLOROTHIAZIDE 25 MG PO TABS: 25.0000 mg | ORAL_TABLET | Freq: Every day | ORAL | Status: DC

## 2011-12-01 NOTE — Assessment & Plan Note (Signed)
Patient is half pack per day smoker and does not wish to quit at this time. I did stress the importance of cessation of smoking.

## 2011-12-01 NOTE — Assessment & Plan Note (Signed)
Last creatinine was 1.5 we will recheck at today's visit. We have stopped ACE inhibitor.

## 2011-12-01 NOTE — Progress Notes (Signed)
Subjective:     Patient ID: Derek Patterson, male   DOB: 11-15-1956, 55 y.o.   MRN: [id number]  HPI The patient is a 55 year-old male who comes in today for a checkup of his blood pressure. He was last seen in August and his blood pressure is under good control at that time after switching from ACE inhibitor to hydrochlorothiazide. He was experiencing a cough at that time which was getting better and he has recently had a viral illness which has caused him to have cough. Occasional green sputum. However it is resolving with over-the-counter medications. He feels much improved today. He only has mild residual symptoms. A little bit of congestion and occasional cough. He states that he is not taking his aspirin, Pepcid, Tylenol. He rarely uses Ultram or Neurontin. He does take his blood pressure medication every day. No other problems or complaints at today's visit. No problems breathing, no chest pain, no fevers, no chills.  Review of Systems  Constitutional: Negative for fever, chills, diaphoresis, activity change, appetite change, fatigue and unexpected weight change.  HENT: Positive for congestion and sore throat.   Respiratory: Negative for cough, chest tightness, shortness of breath, wheezing and stridor.   Cardiovascular: Negative for chest pain, palpitations and leg swelling.  Gastrointestinal: Negative for nausea, vomiting, abdominal pain, diarrhea and constipation.  Musculoskeletal: Negative.   Skin: Negative.   Neurological: Negative for dizziness, tremors, seizures, syncope, weakness, light-headedness, numbness and headaches.  Hematological: Negative.    Vitals: Blood pressure: 110/70 Pulse: 65 Temperature: 97.4F Oxygen saturation room air 98% Height: 5 feet 11 inches Weight: 173 pounds     Objective:   Physical Exam  Constitutional: He is oriented to person, place, and time. He appears well-developed and well-nourished.  HENT:  Head: Normocephalic and atraumatic.  Eyes: EOM are  normal. Pupils are equal, round, and reactive to light.  Neck: Neck supple.  Cardiovascular: Normal rate and regular rhythm.   Pulmonary/Chest: Effort normal and breath sounds normal.  Abdominal: Soft. Bowel sounds are normal. He exhibits no distension. There is no tenderness. There is no rebound.  Musculoskeletal: Normal range of motion.  Neurological: He is alert and oriented to person, place, and time. No cranial nerve deficit.  Skin: Skin is warm and dry.       Assessment/Plan:     1. Please see problem-oriented charting.   2. Disposition- patient will be seen back in 3 months. We are checking basic metabolic panel today to evaluate his kidney status. No changes made to his medical regimens at today's visit. I did stress to him the importance of quitting cigarette smoking. As he is currently having a viral illness that's resolving we will not give flu shot today.

## 2011-12-01 NOTE — Patient Instructions (Signed)
You were seen today for a follow up of your blood pressure. You are doing well with that today! STOP SMOKING! Even cutting back on cigarettes can help your health. We will see you back in 3 months to check on your blood pressure. We are checking some labs today. If there are any problems I will call you. Our office number is [phone]. Please call if your cold is not better or you need refills or wish to be seen sooner.   Happy holidays!  Smoking Cessation, Tips for Success YOU CAN QUIT SMOKING If you are ready to quit smoking, congratulations! You have chosen to help yourself be healthier. Cigarettes bring nicotine, tar, carbon monoxide, and other irritants into your body. Your lungs, heart, and blood vessels will be able to work better without these poisons. There are many different ways to quit smoking. Nicotine gum, nicotine patches, a nicotine inhaler, or nicotine nasal spray can help with physical craving. Hypnosis, support groups, and medicines help break the habit of smoking. Here are some tips to help you quit for good.  Throw away all cigarettes.   Clean and remove all ashtrays from your home, work, and car.   On a card, write down your reasons for quitting. Carry the card with you and read it when you get the urge to smoke.   Cleanse your body of nicotine. Drink enough water and fluids to keep your urine clear or pale yellow. Do this after quitting to flush the nicotine from your body.   Learn to predict your moods. Do not let a bad situation be your excuse to have a cigarette. Some situations in your life might tempt you into wanting a cigarette.   Never have "just one" cigarette. It leads to wanting another and another. Remind yourself of your decision to quit.   Change habits associated with smoking. If you smoked while driving or when feeling stressed, try other activities to replace smoking. Stand up when drinking your coffee. Brush your teeth after eating. Sit in a different  chair when you read the paper. Avoid alcohol while trying to quit, and try to drink fewer caffeinated beverages. Alcohol and caffeine may urge you to smoke.   Avoid foods and drinks that can trigger a desire to smoke, such as sugary or spicy foods and alcohol.   Ask people who smoke not to smoke around you.   Have something planned to do right after eating or having a cup of coffee. Take a walk or exercise to perk you up. This will help to keep you from overeating.   Try a relaxation exercise to calm you down and decrease your stress. Remember, you may be tense and nervous for the first 2 weeks after you quit, but this will pass.   Find new activities to keep your hands busy. Play with a pen, coin, or rubber band. Doodle or draw things on paper.   Brush your teeth right after eating. This will help cut down on the craving for the taste of tobacco after meals. You can try mouthwash, too.   Use oral substitutes, such as lemon drops, carrots, a cinnamon stick, or chewing gum, in place of cigarettes. Keep them handy so they are available when you have the urge to smoke.   When you have the urge to smoke, try deep breathing.   Designate your home as a nonsmoking area.   If you are a heavy smoker, ask your caregiver about a prescription for nicotine chewing gum.  It can ease your withdrawal from nicotine.   Reward yourself. Set aside the cigarette money you save and buy yourself something nice.   Look for support from others. Join a support group or smoking cessation program. Ask someone at home or at work to help you with your plan to quit smoking.   Always ask yourself, "Do I need this cigarette or is this just a reflex?" Tell yourself, "Today, I choose not to smoke," or "I do not want to smoke." You are reminding yourself of your decision to quit, even if you do smoke a cigarette.  HOW WILL I FEEL WHEN I QUIT SMOKING?  The benefits of not smoking start within days of quitting.   You may  have symptoms of withdrawal because your body is used to nicotine (the addictive substance in cigarettes). You may crave cigarettes, be irritable, feel very hungry, cough often, get headaches, or have difficulty concentrating.   The withdrawal symptoms are only temporary. They are strongest when you first quit but will go away within 10 to 14 days.   When withdrawal symptoms occur, stay in control. Think about your reasons for quitting. Remind yourself that these are signs that your body is healing and getting used to being without cigarettes.   Remember that withdrawal symptoms are easier to treat than the major diseases that smoking can cause.   Even after the withdrawal is over, expect periodic urges to smoke. However, these cravings are generally short-lived and will go away whether you smoke or not. Do not smoke!   If you relapse and smoke again, do not lose hope. Most smokers quit 3 times before they are successful.   If you relapse, do not give up! Plan ahead and think about what you will do the next time you get the urge to smoke.  LIFE AS A NONSMOKER: MAKE IT FOR A MONTH, MAKE IT FOR LIFE Day 1: Hang this page where you will see it every day. Day 2: Get rid of all ashtrays, matches, and lighters. Day 3: Drink water. Breathe deeply between sips. Day 4: Avoid places with smoke-filled air, such as bars, clubs, or the smoking section of restaurants. Day 5: Keep track of how much money you save by not smoking. Day 6: Avoid boredom. Keep a good book with you or go to the movies. Day 7: Reward yourself! One week without smoking! Day 8: Make a dental appointment to get your teeth cleaned. Day 9: Decide how you will turn down a cigarette before it is offered to you. Day 10: Review your reasons for quitting. Day 11: Distract yourself. Stay active to keep your mind off smoking and to relieve tension. Take a walk, exercise, read a book, do a crossword puzzle, or try a new hobby. Day 12:  Exercise. Get off the bus before your stop or use stairs instead of escalators. Day 13: Call on friends for support and encouragement. Day 14: Reward yourself! Two weeks without smoking! Day 15: Practice deep breathing exercises. Day 16: Bet a friend that you can stay a nonsmoker. Day 17: Ask to sit in nonsmoking sections of restaurants. Day 18: Hang up "No Smoking" signs. Day 19: Think of yourself as a nonsmoker. Day 20: Each morning, tell yourself you will not smoke. Day 21: Reward yourself! Three weeks without smoking! Day 22: Think of smoking in negative ways. Remember how it stains your teeth, gives you bad breath, and leaves you short of breath. Day 23: Eat a nutritious breakfast.  Day 24:Do not relive your days as a smoker. Day 25: Hold a pencil in your hand when talking on the telephone. Day 26: Tell all your friends you do not smoke. Day 27: Think about how much better food tastes. Day 28: Remember, one cigarette is one too many. Day 29: Take up a hobby that will keep your hands busy. Day 30: Congratulations! One month without smoking! Give yourself a big reward. Your caregiver can direct you to community resources or hospitals for support, which may include:  Group support.   Education.   Hypnosis.   Subliminal therapy.  Document Released: 08/25/2004 Document Revised: 08/09/2011 Document Reviewed: 09/13/2009 [facility] Patient Information 2012 [place], [place].

## 2011-12-01 NOTE — Assessment & Plan Note (Signed)
Well-controlled blood pressure today 110/70. No change to his medical regimen at today's visit. We will check a basic metabolic panel today.

## 2011-12-02 LAB — BASIC METABOLIC PANEL WITH GFR
BUN: 11 mg/dL (ref 6–23)
CO2: 24 mEq/L (ref 19–32)
Calcium: 9.5 mg/dL (ref 8.4–10.5)
Chloride: 107 mEq/L (ref 96–112)
Creat: 1.28 mg/dL (ref 0.50–1.35)
GFR, Est African American: 73 mL/min
GFR, Est Non African American: 63 mL/min
Glucose, Bld: 90 mg/dL (ref 70–99)
Potassium: 4.2 mEq/L (ref 3.5–5.3)
Sodium: 141 mEq/L (ref 135–145)

## 2012-02-16 ENCOUNTER — Encounter: Payer: Self-pay | Admitting: Internal Medicine

## 2012-02-27 ENCOUNTER — Encounter (HOSPITAL_COMMUNITY): Payer: Self-pay | Admitting: Emergency Medicine

## 2012-02-27 ENCOUNTER — Emergency Department (INDEPENDENT_AMBULATORY_CARE_PROVIDER_SITE_OTHER)
Admission: EM | Admit: 2012-02-27 | Discharge: 2012-02-27 | Disposition: A | Discharge status: Home / Self Care | Payer: Self-pay | Source: Home / Self Care | Attending: Family Medicine | Admitting: Family Medicine

## 2012-02-27 DIAGNOSIS — S91009A Unspecified open wound, unspecified ankle, initial encounter: Secondary | ICD-10-CM

## 2012-02-27 DIAGNOSIS — S81011A Laceration without foreign body, right knee, initial encounter: Secondary | ICD-10-CM

## 2012-02-27 MED ORDER — TETANUS-DIPHTH-ACELL PERTUSSIS 5-2.5-18.5 LF-MCG/0.5 IM SUSP
INTRAMUSCULAR | Status: AC
  Filled 2012-02-27: qty 0.5

## 2012-02-27 MED ORDER — TETANUS-DIPHTH-ACELL PERTUSSIS 5-2.5-18.5 LF-MCG/0.5 IM SUSP
0.5000 mL | Freq: Once | INTRAMUSCULAR | Status: AC
  Administered 2012-02-27: 0.5 mL via INTRAMUSCULAR

## 2012-02-27 NOTE — Discharge Instructions (Signed)
Keep wound clean and dry. Can wash with soap and water but avoid getting wet for long period, let dry well before putting antibiotic ointment or covering.  Can leave exposed to the air when inside the house. Follow handout instructions for wound care. Return in 8-10 days for suture removal. Return earlier if redness swelling or drainage.

## 2012-02-27 NOTE — ED Notes (Signed)
Laceration above left knee, bleeding controlled.  Cut leg with chain saw.
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
## 2012-03-01 ENCOUNTER — Encounter: Payer: Self-pay | Admitting: Internal Medicine

## 2012-03-01 ENCOUNTER — Ambulatory Visit (INDEPENDENT_AMBULATORY_CARE_PROVIDER_SITE_OTHER): Payer: Self-pay | Admitting: Internal Medicine

## 2012-03-01 VITALS — BP 126/83 | HR 78 | Temp 97.3°F | Ht 71.0 in | Wt 172.0 lb

## 2012-03-01 DIAGNOSIS — IMO0002 Reserved for concepts with insufficient information to code with codable children: Secondary | ICD-10-CM

## 2012-03-01 DIAGNOSIS — X58XXXA Exposure to other specified factors, initial encounter: Secondary | ICD-10-CM

## 2012-03-01 DIAGNOSIS — S81809A Unspecified open wound, unspecified lower leg, initial encounter: Secondary | ICD-10-CM

## 2012-03-01 NOTE — Patient Instructions (Signed)
Schedule a followup appointment with Dr. Arvilla Market on Tues, Wed, Thurs, or Fri of next week to remove your stitches. Take your temperature if you develop any sweats or shaking chills. If your temperature is greater than 101, schedule an appointment to be seen sooner. Keep a dry bandage on your wound and try not to bend your leg too much to avoid tearing the stiches.

## 2012-03-01 NOTE — Progress Notes (Signed)
Subjective:     Patient ID: Derek Patterson, male   DOB: [birth year], 56 y.o.   MRN: [id number]  HPI  patient is a 56 year old male presenting today for ER followup following traumatic injury to his knee on 02/27/12.  He was cutting some tree limbs when he noticed the chainsaw was cutting into the jeans of his pants; he sustained a  6 cm oblique laceration lateral to left knee.  Per ER note, the would was limited to subcutaneous tissue and skin and spared muscle and tendon.  His wound was sutured in the ER.  He denies any increased pain at the laceration site and denies any increased erythema, abnormal drainage, fevers, or chills.  Review of Systems Review of Systems  Constitutional: Negative for fever, chills, diaphoresis, activity change, appetite change, fatigue and unexpected weight change.  HENT: Negative for hearing loss, congestion and neck stiffness.   Eyes: Negative for photophobia, pain and visual disturbance.  Respiratory: Negative for cough, chest tightness, shortness of breath and wheezing.   Cardiovascular: Negative for chest pain and palpitations.  Gastrointestinal: Negative for abdominal pain, blood in stool and anal bleeding.  Genitourinary: Negative for dysuria, hematuria and difficulty urinating.  Musculoskeletal: Negative for joint swelling.  Neurological: Negative for dizziness, syncope, speech difficulty, weakness, numbness and headaches.     Objective:   Physical Exam Skin: 6cm oblique laceration noted on lateral aspect of distal thigh just above left knee.  Wound appears well healed, well-approximated, clean, and intact. There is no surrounding erythema, induration, palpable fluctuance, or expressible drainage on exam.      Assessment/Plan:

## 2012-03-01 NOTE — Assessment & Plan Note (Signed)
Patient sustained a laceration to his distal thigh that was sutured on 02/27/12.  This wound appears very well healed and is without any sign of poor healing or infection.  I have asked him to return next week for removal of sutures.  He is advised to return to the clinic sooner if he develops fevers, shaking chills, increased redness, increased swelling, worsening pain, or abnormal drainage from his wound.

## 2012-03-07 ENCOUNTER — Encounter: Payer: Self-pay | Admitting: Internal Medicine

## 2012-03-07 ENCOUNTER — Ambulatory Visit (INDEPENDENT_AMBULATORY_CARE_PROVIDER_SITE_OTHER): Payer: Self-pay | Admitting: Internal Medicine

## 2012-03-07 VITALS — BP 132/80 | HR 76 | Temp 97.0°F | Resp 20 | Ht 69.0 in | Wt 173.1 lb

## 2012-03-07 DIAGNOSIS — T148XXA Other injury of unspecified body region, initial encounter: Secondary | ICD-10-CM

## 2012-03-07 DIAGNOSIS — IMO0002 Reserved for concepts with insufficient information to code with codable children: Secondary | ICD-10-CM

## 2012-03-07 DIAGNOSIS — X58XXXA Exposure to other specified factors, initial encounter: Secondary | ICD-10-CM

## 2012-03-07 NOTE — Patient Instructions (Signed)
Return to clinic if you develop any fever, chills, or develop any redness, drainage, or separation of skin at your wound site.

## 2012-03-07 NOTE — Progress Notes (Signed)
Patient ID: Derek Patterson, male   DOB: 1955-12-15, 56 y.o.   MRN: [id number]  Subjective:  HPI  patient is a 56 year old male presenting today for suture removal.   He denies any increased pain at the laceration site and denies any increased erythema, abnormal drainage, fevers, or chills.  Review of Systems  Constitutional: Negative for fever, chills, diaphoresis, activity change, appetite change, fatigue and unexpected weight change.  HENT: Negative for hearing loss, congestion and neck stiffness.   Eyes: Negative for photophobia, pain and visual disturbance.  Respiratory: Negative for cough, chest tightness, shortness of breath and wheezing.   Cardiovascular: Negative for chest pain and palpitations.  Gastrointestinal: Negative for abdominal pain, blood in stool and anal bleeding.  Genitourinary: Negative for dysuria, hematuria and difficulty urinating.  Musculoskeletal: Negative for joint swelling.  Neurological: Negative for dizziness, syncope, speech difficulty, weakness, numbness and headaches.     Objective:   Physical Exam Skin: 6cm oblique laceration noted on lateral aspect of distal thigh just above left knee.  Wound appears well healed, well-approximated, clean, and intact. There is no surrounding erythema, induration, palpable fluctuance, or expressible drainage on exam.      Assessment/Plan:

## 2012-03-07 NOTE — Assessment & Plan Note (Signed)
Sutures removed without difficulty. No evidence of wound dehiscence. Mild serosanguineous drainage from the single suture site noted; this was resolved by the time of sutures were removed. Patient advised to keep wound clean and to return to clinic should he notice any dehiscence, increased erythema, increased drainage, fever, or shaking chills.

## 2012-04-26 ENCOUNTER — Encounter: Payer: Self-pay | Admitting: Internal Medicine

## 2012-08-01 ENCOUNTER — Other Ambulatory Visit: Payer: Self-pay | Admitting: *Deleted

## 2012-08-01 DIAGNOSIS — I10 Essential (primary) hypertension: Secondary | ICD-10-CM

## 2012-08-01 MED ORDER — HYDROCHLOROTHIAZIDE 25 MG PO TABS: 25.0000 mg | ORAL_TABLET | Freq: Every day | ORAL | Status: DC

## 2012-08-01 NOTE — Telephone Encounter (Signed)
Rx called in to pharmacy. 

## 2012-08-03 ENCOUNTER — Encounter (HOSPITAL_COMMUNITY): Payer: Self-pay | Admitting: *Deleted

## 2012-08-03 ENCOUNTER — Emergency Department (HOSPITAL_COMMUNITY)
Admission: EM | Admit: 2012-08-03 | Discharge: 2012-08-03 | Disposition: A | Discharge status: Home / Self Care | Payer: Self-pay | Source: Home / Self Care | Attending: Emergency Medicine | Admitting: Emergency Medicine

## 2012-08-03 DIAGNOSIS — T148XXA Other injury of unspecified body region, initial encounter: Secondary | ICD-10-CM

## 2012-08-03 DIAGNOSIS — IMO0002 Reserved for concepts with insufficient information to code with codable children: Secondary | ICD-10-CM

## 2012-08-03 NOTE — ED Notes (Signed)
Left anterior thigh laceration onset of injury 1130 today - bleeding controlled - approx  4cm  tetnus 02/27/2012

## 2012-08-03 NOTE — ED Provider Notes (Signed)
Chief Complaint  Patient presents with  . Extremity Laceration    History of Present Illness:  Derek Patterson is a 56 year old male who lacerated his left thigh with a chain saw today at 11:30 AM. He has a superficial wound measuring 8 cm. Bleeding was readily controlled. He denies any numbness or tingling in the leg. He had the same thing about 2 months ago and had to be stitched up at that time as well. He got a tetanus vaccine then.  Review of Systems:  Other than noted above, the patient denies any of the following symptoms: Systemic:  No fever or chills. Musculoskeletal:  No joint pain or decreased range of motion. Neuro:  No numbness, tingling, or weakness.  PMFSH:  Past medical history, family history, social history, meds, and allergies were reviewed.  Physical Exam:   Vital signs:  BP 129/85  Pulse 66  Temp 98 F (36.7 C) (Oral)  Resp 16  SpO2 97% Ext:  There is a superficial, straight, 8 cm laceration on the left anterior thigh. There was no visible contamination with dirt, grease, or wood particles.  All joints had a full ROM without pain.  Pulses were full.  Good capillary refill in all digits.  No edema. Neurological:  Alert and oriented.  No muscle weakness.  Sensation was intact to light touch.     Procedure: Verbal informed consent was obtained.  The patient was informed of the risks and benefits of the procedure and understands and accepts.  Identity of the patient was verified verbally and by wristband.   The laceration area described above was prepped with Betadine and saline  and anesthetized with 10 mL of 2% Xylocaine with epinephrine. The wound edges were debrided of devitalized tissue. The wound was then closed as follows:  The wound edges were loosely approximated with 9 4-0 nylon sutures.  There were no immediate complications, and the patient tolerated the procedure well. The laceration was then cleansed, Bacitracin ointment was applied and a clean, dry pressure dressing  was put on.   Assessment:  The encounter diagnosis was Laceration.  Plan:   1.  The following meds were prescribed:   New Prescriptions   No medications on file   2.  The patient was instructed in wound care and pain control, and handouts were given. 3.  The patient was told to return in 10 days for suture removal or wound recheck or sooner if any sign of infection.     Reuben Likes, MD 08/03/12 [phone]

## 2012-08-15 ENCOUNTER — Encounter (HOSPITAL_COMMUNITY): Payer: Self-pay | Admitting: *Deleted

## 2012-08-15 ENCOUNTER — Emergency Department (INDEPENDENT_AMBULATORY_CARE_PROVIDER_SITE_OTHER)
Admission: EM | Admit: 2012-08-15 | Discharge: 2012-08-15 | Disposition: A | Discharge status: Home / Self Care | Payer: Self-pay | Source: Home / Self Care | Attending: Emergency Medicine | Admitting: Emergency Medicine

## 2012-08-15 DIAGNOSIS — IMO0002 Reserved for concepts with insufficient information to code with codable children: Secondary | ICD-10-CM

## 2012-08-15 DIAGNOSIS — Z4802 Encounter for removal of sutures: Secondary | ICD-10-CM

## 2012-08-15 MED ORDER — BACITRACIN 500 UNIT/GM EX OINT
1.0000 "application " | TOPICAL_OINTMENT | Freq: Once | CUTANEOUS | Status: AC
  Administered 2012-08-15: 1 via TOPICAL

## 2012-08-15 NOTE — ED Notes (Signed)
Suture removal to upper left thigh
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
## 2013-02-21 ENCOUNTER — Encounter: Payer: Self-pay | Admitting: Internal Medicine

## 2013-02-21 ENCOUNTER — Ambulatory Visit (INDEPENDENT_AMBULATORY_CARE_PROVIDER_SITE_OTHER): Payer: Self-pay | Admitting: Internal Medicine

## 2013-02-21 VITALS — BP 134/84 | HR 62 | Temp 97.8°F | Ht 71.0 in | Wt 176.3 lb

## 2013-02-21 DIAGNOSIS — N400 Enlarged prostate without lower urinary tract symptoms: Secondary | ICD-10-CM

## 2013-02-21 DIAGNOSIS — N289 Disorder of kidney and ureter, unspecified: Secondary | ICD-10-CM

## 2013-02-21 DIAGNOSIS — F17200 Nicotine dependence, unspecified, uncomplicated: Secondary | ICD-10-CM

## 2013-02-21 DIAGNOSIS — I10 Essential (primary) hypertension: Secondary | ICD-10-CM

## 2013-02-21 DIAGNOSIS — Z23 Encounter for immunization: Secondary | ICD-10-CM

## 2013-02-21 DIAGNOSIS — K219 Gastro-esophageal reflux disease without esophagitis: Secondary | ICD-10-CM

## 2013-02-21 LAB — COMPLETE METABOLIC PANEL WITH GFR
AST: 23 U/L (ref 0–37)
Albumin: 4.3 g/dL (ref 3.5–5.2)
Alkaline Phosphatase: 89 U/L (ref 39–117)
BUN: 18 mg/dL (ref 6–23)
Potassium: 4.5 mEq/L (ref 3.5–5.3)
Sodium: 137 mEq/L (ref 135–145)
Total Bilirubin: 0.5 mg/dL (ref 0.3–1.2)
Total Protein: 7 g/dL (ref 6.0–8.3)

## 2013-02-21 LAB — CBC
MCV: 79.9 fL (ref 78.0–100.0)
Platelets: 331 10*3/uL (ref 150–400)
RBC: 5.52 MIL/uL (ref 4.22–5.81)
WBC: 5.9 10*3/uL (ref 4.0–10.5)

## 2013-02-21 LAB — LIPID PANEL
Cholesterol: 163 mg/dL (ref 0–200)
HDL: 45 mg/dL (ref >39)
LDL Cholesterol: 104 mg/dL — ABNORMAL HIGH (ref 0–99)
Triglycerides: 72 mg/dL (ref <150)

## 2013-02-21 MED ORDER — TAMSULOSIN HCL 0.4 MG PO CAPS: 0.4000 mg | ORAL_CAPSULE | Freq: Every day | ORAL | Status: DC

## 2013-02-21 MED ORDER — HYDROCHLOROTHIAZIDE 25 MG PO TABS: 25.0000 mg | ORAL_TABLET | Freq: Every day | ORAL | Status: DC

## 2013-02-21 NOTE — Assessment & Plan Note (Signed)
Pneumococcus vaccination given today 

## 2013-02-21 NOTE — Assessment & Plan Note (Signed)
Blood pressure well controlled on current regimen with hydrochlorothiazide 25 mg daily. Prescription was sent to the pharmacy today. I will obtain basic metabolic panel .

## 2013-02-21 NOTE — Assessment & Plan Note (Signed)
Influenza vaccination given today. 

## 2013-02-21 NOTE — Assessment & Plan Note (Signed)
Patient denies any symptoms. Stopped using Pepcid

## 2013-02-21 NOTE — Assessment & Plan Note (Signed)
History of renal insufficiency. I will obtain basic metabolic panel along with microalbumin/ creatinine ratio
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
## 2013-02-21 NOTE — Assessment & Plan Note (Signed)
Urinary symptoms likely consistent with BPH. I will give a trial with Flomax.

## 2013-02-21 NOTE — Assessment & Plan Note (Signed)
Consult on smoking. Brochure given out.

## 2013-02-22 LAB — MICROALBUMIN / CREATININE URINE RATIO
Microalb Creat Ratio: 4.8 mg/g (ref 0.0–30.0)
Microalb, Ur: 0.5 mg/dL (ref 0.00–1.89)

## 2013-05-30 ENCOUNTER — Encounter: Payer: Self-pay | Admitting: Internal Medicine

## 2013-05-30 ENCOUNTER — Ambulatory Visit (INDEPENDENT_AMBULATORY_CARE_PROVIDER_SITE_OTHER): Payer: No Typology Code available for payment source | Admitting: Internal Medicine

## 2013-05-30 VITALS — BP 122/71 | HR 71 | Temp 97.9°F | Ht 71.0 in | Wt 175.3 lb

## 2013-05-30 DIAGNOSIS — Z720 Tobacco use: Secondary | ICD-10-CM

## 2013-05-30 DIAGNOSIS — N259 Disorder resulting from impaired renal tubular function, unspecified: Secondary | ICD-10-CM

## 2013-05-30 DIAGNOSIS — I10 Essential (primary) hypertension: Secondary | ICD-10-CM

## 2013-05-30 DIAGNOSIS — F17200 Nicotine dependence, unspecified, uncomplicated: Secondary | ICD-10-CM

## 2013-05-30 DIAGNOSIS — K219 Gastro-esophageal reflux disease without esophagitis: Secondary | ICD-10-CM

## 2013-05-30 MED ORDER — PANTOPRAZOLE SODIUM 40 MG PO TBEC: 40.0000 mg | DELAYED_RELEASE_TABLET | Freq: Every day | ORAL | Status: DC

## 2013-05-30 NOTE — Assessment & Plan Note (Signed)
Recent Cr stable and GFR stable. Will monitor every 6 months to 1 year. Microalbumin to creatinine ratio stable as well.

## 2013-05-30 NOTE — Assessment & Plan Note (Signed)
  Assessment: Progress toward smoking cessation:  smoking less Barriers to progress toward smoking cessation:  other tobacco users at work Comments:   Plan: Instruction/counseling given:  I counseled patient on the dangers of tobacco use, advised patient to stop smoking, and reviewed strategies to maximize success. Educational resources provided:  [employer]line[employer], [employer]) brochure Self management tools provided:  smoking cessation plan ([employer] Quit Plan) Medications to assist with smoking cessation:  None Patient agreed to the following self-care plans for smoking cessation: go to the [employer] ([email or web address])  Other plans:

## 2013-05-30 NOTE — Assessment & Plan Note (Signed)
BP Readings from Last 3 Encounters:  05/30/13 122/71  02/21/13 134/84  08/15/12 130/85    Lab Results  Component Value Date   NA 137 02/21/2013   K 4.5 02/21/2013   CREATININE 1.22 02/21/2013    Assessment: Blood pressure control: controlled Progress toward BP goal:  at goal Comments:   Plan: Medications:  continue current medications, HCTZ 25 mg daily Educational resources provided: video Self management tools provided: instructions for home blood pressure monitoring Other plans:

## 2013-05-30 NOTE — Progress Notes (Signed)
Case discussed with Dr. Kollar soon after the resident saw the patient.  We reviewed the resident's history and exam and pertinent patient test results.  I agree with the assessment, diagnosis, and plan of care documented in the resident's note. 

## 2013-05-30 NOTE — Assessment & Plan Note (Signed)
Patient took himself off medications some time ago and will trial back protonix 40 mg daily to see if this improves his cough.

## 2013-05-30 NOTE — Patient Instructions (Signed)
General Instructions:  We will add an acid reflux medicine for the cough. Take protonix 1 pill per day for 2-3 weeks. If the cough is getting worse or not getting better come back to the clinic. If it is improving, continue taking it until you come back to the clinic.  Come back to the clinic in 3-6 months.    Treatment Goals:  Goals (1 Years of Data) as of 05/30/13         As of Today 02/21/13 08/15/12 08/03/12 03/07/12     Blood Pressure    . Blood Pressure < 140/90  122/71 134/84 130/85 129/85 132/80     Lifestyle    . Quit smoking / using tobacco  No No         Progress Toward Treatment Goals:  Treatment Goal 05/30/2013  Blood pressure at goal  Stop smoking smoking less    Self Care Goals & Plans:  Self Care Goal 05/30/2013  Manage my medications take my medicines as prescribed  Monitor my health keep track of my weight  Eat healthy foods eat more vegetables  Be physically active find an activity I enjoy  Stop smoking go to the [employer] ([email or web address])  Meeting treatment goals maintain the current self-care plan       Care Management & Community Referrals:  Referral 05/30/2013  Referrals made for care management support none needed
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
## 2013-06-02 ENCOUNTER — Telehealth: Payer: Self-pay | Admitting: *Deleted

## 2013-06-02 NOTE — Telephone Encounter (Signed)
Wife called about Protonix Rx $65.00 - suggest to call the [facility] pharmacy. Stanton Kidney Hiran Leard RN 06/02/13 9:45AM

## 2013-06-03 ENCOUNTER — Telehealth: Payer: Self-pay | Admitting: *Deleted

## 2013-06-03 MED ORDER — OMEPRAZOLE 40 MG PO CPDR: 40.0000 mg | DELAYED_RELEASE_CAPSULE | Freq: Every day | ORAL | Status: DC

## 2013-06-03 NOTE — Telephone Encounter (Signed)
Wife called back to get a Rx for Prilosec at [facility]. Unable to afford Protonix at retail pharmacy and no longer carried at [facility]. Stanton Kidney Iwalani Templeton RN 06/03/13 11AM

## 2013-06-03 NOTE — Telephone Encounter (Signed)
Will order thanks! [facility]. Please call and let him know he can pick it up there.   Dr. Dorise Hiss

## 2013-06-05 NOTE — Telephone Encounter (Signed)
Rx for Prilosec called in to pharmacy. Stanton Kidney Romonda Parker RN  06/05/13 9AM

## 2013-07-08 ENCOUNTER — Other Ambulatory Visit: Payer: Self-pay | Admitting: Internal Medicine

## 2013-08-22 ENCOUNTER — Encounter: Payer: No Typology Code available for payment source | Admitting: Internal Medicine

## 2013-08-29 ENCOUNTER — Encounter: Payer: Self-pay | Admitting: Internal Medicine

## 2013-08-29 ENCOUNTER — Ambulatory Visit (INDEPENDENT_AMBULATORY_CARE_PROVIDER_SITE_OTHER): Payer: No Typology Code available for payment source | Admitting: Internal Medicine

## 2013-08-29 VITALS — BP 125/82 | HR 74 | Temp 97.2°F | Ht 71.0 in | Wt 171.1 lb

## 2013-08-29 DIAGNOSIS — Z23 Encounter for immunization: Secondary | ICD-10-CM

## 2013-08-29 DIAGNOSIS — M25519 Pain in unspecified shoulder: Secondary | ICD-10-CM

## 2013-08-29 DIAGNOSIS — Z720 Tobacco use: Secondary | ICD-10-CM

## 2013-08-29 DIAGNOSIS — K219 Gastro-esophageal reflux disease without esophagitis: Secondary | ICD-10-CM

## 2013-08-29 DIAGNOSIS — I10 Essential (primary) hypertension: Secondary | ICD-10-CM

## 2013-08-29 DIAGNOSIS — F17200 Nicotine dependence, unspecified, uncomplicated: Secondary | ICD-10-CM

## 2013-08-29 DIAGNOSIS — Z299 Encounter for prophylactic measures, unspecified: Secondary | ICD-10-CM

## 2013-08-29 DIAGNOSIS — M25512 Pain in left shoulder: Secondary | ICD-10-CM

## 2013-08-29 MED ORDER — OMEPRAZOLE 40 MG PO CPDR: 40.0000 mg | DELAYED_RELEASE_CAPSULE | Freq: Every day | ORAL | Status: DC

## 2013-08-29 MED ORDER — HYDROCHLOROTHIAZIDE 25 MG PO TABS: 25.0000 mg | ORAL_TABLET | Freq: Every day | ORAL | Status: DC

## 2013-08-29 MED ORDER — DICLOFENAC SODIUM 75 MG PO TBEC: 75.0000 mg | DELAYED_RELEASE_TABLET | Freq: Two times a day (BID) | ORAL | Status: DC

## 2013-08-29 NOTE — Patient Instructions (Addendum)
We will have you try a medicine for your shoulder pain that is a cousin of advil. It is called diclofenac. Take 1 pill twice a day for 2 weeks to see if this will calm down the muscles while you start stretching.   Come back in 6 months if everything is doing well. Call us with questions or problems at [phone].  Shoulder Exercises EXERCISES  RANGE OF MOTION (ROM) AND STRETCHING EXERCISES These exercises may help you when beginning to rehabilitate your injury. Your symptoms may resolve with or without further involvement from your physician, physical therapist or athletic trainer. While completing these exercises, remember:   Restoring tissue flexibility helps normal motion to return to the joints. This allows healthier, less painful movement and activity.  An effective stretch should be held for at least 30 seconds.  A stretch should never be painful. You should only feel a gentle lengthening or release in the stretched tissue. ROM - Pendulum  Bend at the waist so that your right / left arm falls away from your body. Support yourself with your opposite hand on a solid surface, such as a table or a countertop.  Your right / left arm should be perpendicular to the ground. If it is not perpendicular, you need to lean over farther. Relax the muscles in your right / left arm and shoulder as much as possible.  Gently sway your hips and trunk so they move your right / left arm without any use of your right / left shoulder muscles.  Progress your movements so that your right / left arm moves side to side, then forward and backward, and finally, both clockwise and counterclockwise.  Complete __________ repetitions in each direction. Many people use this exercise to relieve discomfort in their shoulder as well as to gain range of motion. Repeat __________ times. Complete this exercise __________ times per day. STRETCH  Flexion, Standing  Stand with good posture. With an underhand grip on your  right / left hand and an overhand grip on the opposite hand, grasp a broomstick or cane so that your hands are a little more than shoulder-width apart.  Keeping your right / left elbow straight and shoulder muscles relaxed, push the stick with your opposite hand to raise your right / left arm in front of your body and then overhead. Raise your arm until you feel a stretch in your right / left shoulder, but before you have increased shoulder pain.  Try to avoid shrugging your right / left shoulder as your arm rises by keeping your shoulder blade tucked down and toward your mid-back spine. Hold __________ seconds.  Slowly return to the starting position. Repeat __________ times. Complete this exercise __________ times per day. STRETCH - Internal Rotation  Place your right / left hand behind your back, palm-up.  Throw a towel or belt over your opposite shoulder. Grasp the towel/belt with your right / left hand.  While keeping an upright posture, gently pull up on the towel/belt until you feel a stretch in the front of your right / left shoulder.  Avoid shrugging your right / left shoulder as your arm rises by keeping your shoulder blade tucked down and toward your mid-back spine.  Hold __________. Release the stretch by lowering your opposite hand. Repeat __________ times. Complete this exercise __________ times per day. STRETCH - External Rotation and Abduction  Stagger your stance through a doorframe. It does not matter which foot is forward.  As instructed by your physician, physical  therapist or athletic trainer, place your hands:  And forearms above your head and on the door frame.  And forearms at head-height and on the door frame.  At elbow-height and on the door frame.  Keeping your head and chest upright and your stomach muscles tight to prevent over-extending your low-back, slowly shift your weight onto your front foot until you feel a stretch across your chest and/or in the  front of your shoulders.  Hold __________ seconds. Shift your weight to your back foot to release the stretch. Repeat __________ times. Complete this stretch __________ times per day.  STRENGTHENING EXERCISES  These exercises may help you when beginning to rehabilitate your injury. They may resolve your symptoms with or without further involvement from your physician, physical therapist or athletic trainer. While completing these exercises, remember:   Muscles can gain both the endurance and the strength needed for everyday activities through controlled exercises.  Complete these exercises as instructed by your physician, physical therapist or athletic trainer. Progress the resistance and repetitions only as guided.  You may experience muscle soreness or fatigue, but the pain or discomfort you are trying to eliminate should never worsen during these exercises. If this pain does worsen, stop and make certain you are following the directions exactly. If the pain is still present after adjustments, discontinue the exercise until you can discuss the trouble with your clinician.  If advised by your physician, during your recovery, avoid activity or exercises which involve actions that place your right / left hand or elbow above your head or behind your back or head. These positions stress the tissues which are trying to heal. STRENGTH - Scapular Depression and Adduction  With good posture, sit on a firm chair. Supported your arms in front of you with pillows, arm rests or a table top. Have your elbows in line with the sides of your body.  Gently draw your shoulder blades down and toward your mid-back spine. Gradually increase the tension without tensing the muscles along the top of your shoulders and the back of your neck.  Hold for __________ seconds. Slowly release the tension and relax your muscles completely before completing the next repetition.  After you have practiced this exercise, remove  the arm support and complete it in standing as well as sitting. Repeat __________ times. Complete this exercise __________ times per day.  STRENGTH - External Rotators  Secure a rubber exercise band/tubing to a fixed object so that it is at the same height as your right / left elbow when you are standing or sitting on a firm surface.  Stand or sit so that the secured exercise band/tubing is at your side that is not injured.  Bend your elbow 90 degrees. Place a folded towel or small pillow under your right / left arm so that your elbow is a few inches away from your side.  Keeping the tension on the exercise band/tubing, pull it away from your body, as if pivoting on your elbow. Be sure to keep your body steady so that the movement is only coming from your shoulder rotating.  Hold __________ seconds. Release the tension in a controlled manner as you return to the starting position. Repeat __________ times. Complete this exercise __________ times per day.  STRENGTH - Supraspinatus  Stand or sit with good posture. Grasp a __________ weight or an exercise band/tubing so that your hand is "thumbs-up," like when you shake hands.  Slowly lift your right / left hand from your  thigh into the air, traveling about 30 degrees from straight out at your side. Lift your hand to shoulder height or as far as you can without increasing any shoulder pain. Initially, many people do not lift their hands above shoulder height.  Avoid shrugging your right / left shoulder as your arm rises by keeping your shoulder blade tucked down and toward your mid-back spine.  Hold for __________ seconds. Control the descent of your hand as you slowly return to your starting position. Repeat __________ times. Complete this exercise __________ times per day.  STRENGTH - Shoulder Extensors  Secure a rubber exercise band/tubing so that it is at the height of your shoulders when you are either standing or sitting on a firm  arm-less chair.  With a thumbs-up grip, grasp an end of the band/tubing in each hand. Straighten your elbows and lift your hands straight in front of you at shoulder height. Step back away from the secured end of band/tubing until it becomes tense.  Squeezing your shoulder blades together, pull your hands down to the sides of your thighs. Do not allow your hands to go behind you.  Hold for __________ seconds. Slowly ease the tension on the band/tubing as you reverse the directions and return to the starting position. Repeat __________ times. Complete this exercise __________ times per day.  STRENGTH - Scapular Retractors  Secure a rubber exercise band/tubing so that it is at the height of your shoulders when you are either standing or sitting on a firm arm-less chair.  With a palm-down grip, grasp an end of the band/tubing in each hand. Straighten your elbows and lift your hands straight in front of you at shoulder height. Step back away from the secured end of band/tubing until it becomes tense.  Squeezing your shoulder blades together, draw your elbows back as you bend them. Keep your upper arm lifted away from your body throughout the exercise.  Hold __________ seconds. Slowly ease the tension on the band/tubing as you reverse the directions and return to the starting position. Repeat __________ times. Complete this exercise __________ times per day. STRENGTH  Scapular Depressors  Find a sturdy chair without wheels, such as a from a dining room table.  Keeping your feet on the floor, lift your bottom from the seat and lock your elbows.  Keeping your elbows straight, allow gravity to pull your body weight down. Your shoulders will rise toward your ears.  Raise your body against gravity by drawing your shoulder blades down your back, shortening the distance between your shoulders and ears. Although your feet should always maintain contact with the floor, your feet should progressively  support less body weight as you get stronger.  Hold __________ seconds. In a controlled and slow manner, lower your body weight to begin the next repetition. Repeat __________ times. Complete this exercise __________ times per day.  Document Released: 10/11/2005 Document Revised: 02/19/2012 Document Reviewed: 03/11/2009 [facility] Patient Information 2014 [place], [place].

## 2013-08-30 NOTE — Assessment & Plan Note (Signed)
Will trial diclofenac 75 mg bid for 2 weeks to see if he has benefit. Will also give information about stretching exercises so he can see if those help. Advised heat could help.
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
## 2013-08-30 NOTE — Assessment & Plan Note (Signed)
BP Readings from Last 3 Encounters:  08/29/13 125/82  05/30/13 122/71  02/21/13 134/84    Lab Results  Component Value Date   NA 137 02/21/2013   K 4.5 02/21/2013   CREATININE 1.22 02/21/2013    Assessment: Blood pressure control: controlled Progress toward BP goal:  at goal Comments:   Plan: Medications:  continue current medications, hctz 25 mg daily Educational resources provided: brochure;handout;video Self management tools provided:   Other plans:

## 2013-08-30 NOTE — Assessment & Plan Note (Signed)
  Assessment: Progress toward smoking cessation:   no change Barriers to progress toward smoking cessation:   lack of plan to quit Comments:   Plan: Instruction/counseling given:  I counseled patient on the dangers of tobacco use, advised patient to stop smoking, and reviewed strategies to maximize success. Educational resources provided:  [employer]) brochure Self management tools provided:    Medications to assist with smoking cessation:  None Patient agreed to the following self-care plans for smoking cessation:    Other plans:

## 2013-08-30 NOTE — Assessment & Plan Note (Signed)
He seemed to benefit from omeprazole 40 mg daily and will refill and advised him to continue as it benefits her cough.

## 2013-09-05 NOTE — Progress Notes (Signed)
Case discussed with Dr. Kollar soon after the resident saw the patient.  We reviewed the resident's history and exam and pertinent patient test results.  I agree with the assessment, diagnosis, and plan of care documented in the resident's note. 

## 2013-09-22 ENCOUNTER — Ambulatory Visit: Payer: Self-pay

## 2013-11-13 ENCOUNTER — Other Ambulatory Visit: Payer: Self-pay | Admitting: *Deleted

## 2013-11-14 MED ORDER — HYDROCHLOROTHIAZIDE 25 MG PO TABS: 25.0000 mg | ORAL_TABLET | Freq: Every day | ORAL | Status: DC

## 2013-11-14 NOTE — Telephone Encounter (Signed)
[facility] informed of HCTZ refill.

## 2014-03-05 ENCOUNTER — Encounter: Payer: Self-pay | Admitting: Internal Medicine

## 2014-03-05 ENCOUNTER — Ambulatory Visit (INDEPENDENT_AMBULATORY_CARE_PROVIDER_SITE_OTHER): Payer: No Typology Code available for payment source | Admitting: Internal Medicine

## 2014-03-05 VITALS — BP 135/85 | HR 83 | Temp 96.8°F | Ht 71.0 in | Wt 176.3 lb

## 2014-03-05 DIAGNOSIS — I10 Essential (primary) hypertension: Secondary | ICD-10-CM

## 2014-03-05 DIAGNOSIS — N259 Disorder resulting from impaired renal tubular function, unspecified: Secondary | ICD-10-CM

## 2014-03-05 DIAGNOSIS — N289 Disorder of kidney and ureter, unspecified: Secondary | ICD-10-CM

## 2014-03-05 DIAGNOSIS — F17200 Nicotine dependence, unspecified, uncomplicated: Secondary | ICD-10-CM

## 2014-03-05 DIAGNOSIS — K219 Gastro-esophageal reflux disease without esophagitis: Secondary | ICD-10-CM

## 2014-03-05 DIAGNOSIS — Z720 Tobacco use: Secondary | ICD-10-CM

## 2014-03-05 MED ORDER — HYDROCHLOROTHIAZIDE 25 MG PO TABS: 25.0000 mg | ORAL_TABLET | Freq: Every day | ORAL | Status: DC
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
## 2014-03-05 NOTE — Patient Instructions (Signed)
We will check on your kidneys today. You are doing great with your blood pressure.   Work on quitting cigarettes and think about getting your eyes checked.  Come back in 6-12 months to meet your new doctor.  If you are feeling sick or need refills before then call us at [phone]

## 2014-03-06 LAB — BASIC METABOLIC PANEL WITH GFR
BUN: 22 mg/dL (ref 6–23)
CO2: 27 mEq/L (ref 19–32)
Calcium: 10 mg/dL (ref 8.4–10.5)
Chloride: 100 mEq/L (ref 96–112)
Creat: 1.48 mg/dL — ABNORMAL HIGH (ref 0.50–1.35)
GFR, EST AFRICAN AMERICAN: 60 mL/min
GFR, Est Non African American: 52 mL/min — ABNORMAL LOW
Glucose, Bld: 88 mg/dL (ref 70–99)
Potassium: 4.2 mEq/L (ref 3.5–5.3)
SODIUM: 138 meq/L (ref 135–145)

## 2014-03-06 NOTE — Assessment & Plan Note (Signed)
He is still smoking about 1/2 PPD and is thinking about quitting as his boss recently quit but he is still pre-contemplative.

## 2014-03-06 NOTE — Assessment & Plan Note (Signed)
Recheck creatinine at visit today.

## 2014-03-06 NOTE — Assessment & Plan Note (Signed)
Prilosec only prn and not taking very often.

## 2014-03-06 NOTE — Assessment & Plan Note (Signed)
BP controlled at 135/85 today on only HCTZ 25 mg daily. Will continue and recheck in 6-12 months. BMP today.

## 2014-03-09 NOTE — Progress Notes (Signed)
Case discussed with Dr. Kollar soon after the resident saw the patient.  We reviewed the resident's history and exam and pertinent patient test results.  I agree with the assessment, diagnosis, and plan of care documented in the resident's note. 

## 2014-08-04 ENCOUNTER — Encounter: Payer: Self-pay | Admitting: Internal Medicine

## 2014-09-02 ENCOUNTER — Ambulatory Visit: Payer: Self-pay

## 2014-09-11 ENCOUNTER — Encounter: Payer: Self-pay | Admitting: Internal Medicine

## 2014-09-11 ENCOUNTER — Ambulatory Visit (INDEPENDENT_AMBULATORY_CARE_PROVIDER_SITE_OTHER): Payer: No Typology Code available for payment source | Admitting: Internal Medicine

## 2014-09-11 VITALS — BP 129/80 | HR 65 | Temp 97.8°F | Ht 71.0 in | Wt 169.9 lb

## 2014-09-11 DIAGNOSIS — J3089 Other allergic rhinitis: Secondary | ICD-10-CM

## 2014-09-11 MED ORDER — SALINE SPRAY 0.65 % NA SOLN: 2.0000 | Freq: Two times a day (BID) | NASAL | Status: DC

## 2014-09-11 MED ORDER — FLUTICASONE PROPIONATE 50 MCG/ACT NA SUSP: 1.0000 | Freq: Every day | NASAL | Status: DC

## 2014-09-11 MED ORDER — DESLORATADINE 5 MG PO TABS: 5.0000 mg | ORAL_TABLET | Freq: Every day | ORAL | Status: DC

## 2014-09-11 NOTE — Patient Instructions (Signed)
**Begin taking Clarinx 5mg  once a day.  **Begin using nasal saline spray, Ocean, 2 sprays in each nostril 2 times a day and then blow your nose really well. In the morning, after using the saline spray, spray 1-2 sprays of the Flonase in each nostril.    Allergic Rhinitis Allergic rhinitis is when the mucous membranes in the nose respond to allergens. Allergens are particles in the air that cause your body to have an allergic reaction. This causes you to release allergic antibodies. Through a chain of events, these eventually cause you to release histamine into the blood stream. Although meant to protect the body, it is this release of histamine that causes your discomfort, such as frequent sneezing, congestion, and an itchy, runny nose.  CAUSES  Seasonal allergic rhinitis (hay fever) is caused by pollen allergens that may come from grasses, trees, and weeds. Year-round allergic rhinitis (perennial allergic rhinitis) is caused by allergens such as house dust mites, pet dander, and mold spores.  SYMPTOMS   Nasal stuffiness (congestion).  Itchy, runny nose with sneezing and tearing of the eyes. DIAGNOSIS  Your health care provider can help you determine the allergen or allergens that trigger your symptoms. If you and your health care provider are unable to determine the allergen, skin or blood testing may be used. TREATMENT  Allergic rhinitis does not have a cure, but it can be controlled by:  Medicines and allergy shots (immunotherapy).  Avoiding the allergen. Hay fever may often be treated with antihistamines in pill or nasal spray forms. Antihistamines block the effects of histamine. There are over-the-counter medicines that may help with nasal congestion and swelling around the eyes. Check with your health care provider before taking or giving this medicine.  If avoiding the allergen or the medicine prescribed do not work, there are many new medicines your health care provider can prescribe.  Stronger medicine may be used if initial measures are ineffective. Desensitizing injections can be used if medicine and avoidance does not work. Desensitization is when a patient is given ongoing shots until the body becomes less sensitive to the allergen. Make sure you follow up with your health care provider if problems continue. HOME CARE INSTRUCTIONS It is not possible to completely avoid allergens, but you can reduce your symptoms by taking steps to limit your exposure to them. It helps to know exactly what you are allergic to so that you can avoid your specific triggers. SEEK MEDICAL CARE IF:   You have a fever.  You develop a cough that does not stop easily (persistent).  You have shortness of breath.  You start wheezing.  Symptoms interfere with normal daily activities. Document Released: 08/22/2001 Document Revised: 12/02/2013 Document Reviewed: 08/04/2013 [facility] Patient Information 2015 [place], [place]. This information is not intended to replace advice given to you by your health care provider. Make sure you discuss any questions you have with your health care provider.  General Instructions:   Please bring your medicines with you each time you come to clinic.  Medicines may include prescription medications, over-the-counter medications, herbal remedies, eye drops, vitamins, or other pills.   Progress Toward Treatment Goals:  Treatment Goal 08/29/2013  Blood pressure at goal  Stop smoking smoking the same amount    Self Care Goals & Plans:  Self Care Goal 09/11/2014  Manage my medications take my medicines as prescribed; bring my medications to every visit; refill my medications on time; follow the sick day instructions if I am sick  Monitor  my health keep track of my blood pressure; keep track of my weight  Eat healthy foods eat more vegetables; eat fruit for snacks and desserts; eat baked foods instead of fried foods; eat foods that are low in salt; eat smaller  portions  Be physically active find an activity I enjoy  Stop smoking -  Meeting treatment goals -    No flowsheet data found.   Care Management & Community Referrals:  Referral 08/29/2013  Referrals made for care management support none needed
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
## 2014-09-11 NOTE — Assessment & Plan Note (Signed)
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 He states that he began having increased nasal congestion and a dry cough. He has been having episodes of productive coughing with green mucus that resolve after taking Alka Seltzer Plus. He is not on any medications for allergic rhinitis, which seems to be the source of his symptoms. He is afebrile here and his lungs are clear, so pneumonia is less likely.  - Start Clarinx 5mg  daily - Start nasal saline spray, 2 sprays in each nostril BID - Start Flonase 1-2 spray in each nostril qam.

## 2014-09-14 NOTE — Progress Notes (Signed)
INTERNAL MEDICINE TEACHING ATTENDING ADDENDUM - Jacquelyn Antony, MD: I reviewed and discussed at the time of visit with the resident Dr. Glenn, the patient's medical history, physical examination, diagnosis and results of pertinent tests and treatment and I agree with the patient's care as documented.  

## 2014-11-12 ENCOUNTER — Encounter: Payer: No Typology Code available for payment source | Admitting: Internal Medicine

## 2014-12-22 ENCOUNTER — Ambulatory Visit (INDEPENDENT_AMBULATORY_CARE_PROVIDER_SITE_OTHER): Payer: No Typology Code available for payment source | Admitting: Internal Medicine

## 2014-12-22 ENCOUNTER — Ambulatory Visit (HOSPITAL_COMMUNITY)
Admission: RE | Admit: 2014-12-22 | Discharge: 2014-12-22 | Disposition: A | Discharge status: Home / Self Care | Payer: No Typology Code available for payment source | Source: Ambulatory Visit | Attending: Internal Medicine | Admitting: Internal Medicine

## 2014-12-22 ENCOUNTER — Encounter: Payer: Self-pay | Admitting: Internal Medicine

## 2014-12-22 VITALS — BP 128/73 | HR 82 | Temp 97.9°F | Ht 71.0 in | Wt 171.3 lb

## 2014-12-22 DIAGNOSIS — L989 Disorder of the skin and subcutaneous tissue, unspecified: Secondary | ICD-10-CM

## 2014-12-22 DIAGNOSIS — R053 Chronic cough: Secondary | ICD-10-CM

## 2014-12-22 DIAGNOSIS — R05 Cough: Secondary | ICD-10-CM

## 2014-12-22 DIAGNOSIS — F17210 Nicotine dependence, cigarettes, uncomplicated: Secondary | ICD-10-CM

## 2014-12-22 DIAGNOSIS — N289 Disorder of kidney and ureter, unspecified: Secondary | ICD-10-CM

## 2014-12-22 LAB — CBC
HEMATOCRIT: 43.6 % (ref 39.0–52.0)
HEMOGLOBIN: 15.3 g/dL (ref 13.0–17.0)
MCH: 27.9 pg (ref 26.0–34.0)
MCHC: 35.1 g/dL (ref 30.0–36.0)
MCV: 79.6 fL (ref 78.0–100.0)
MPV: 8 fL — ABNORMAL LOW (ref 8.6–12.4)
Platelets: 345 10*3/uL (ref 150–400)
RBC: 5.48 MIL/uL (ref 4.22–5.81)
RDW: 14.4 % (ref 11.5–15.5)
WBC: 8.1 10*3/uL (ref 4.0–10.5)

## 2014-12-22 LAB — BASIC METABOLIC PANEL WITH GFR
BUN: 15 mg/dL (ref 6–23)
CHLORIDE: 103 meq/L (ref 96–112)
CO2: 26 meq/L (ref 19–32)
Calcium: 9.6 mg/dL (ref 8.4–10.5)
Creat: 1.15 mg/dL (ref 0.50–1.35)
GFR, Est African American: 81 mL/min
GFR, Est Non African American: 70 mL/min
GLUCOSE: 93 mg/dL (ref 70–99)
Potassium: 4.4 mEq/L (ref 3.5–5.3)
Sodium: 138 mEq/L (ref 135–145)

## 2014-12-22 NOTE — Progress Notes (Signed)
   Subjective:    Patient ID: Derek Patterson, male    DOB: 13-Mar-1956, 59 y.o.   MRN: [id number]  HPI Mr. Derek Patterson is a 59 yo man with a history of HTN, CKD stage III and allergic rhinitis who presents to clinic for general check-up. He continues to have a cough productive of clear sputum. He has some shortness of breath on exertion. He denies fever, chills or chest pain but has had decreased energy over the past few months. He has had no weight loss.   He thinks the cough started when he got quite sick with what sounds like a URI over the summer. This occurred during a trip to [place], [place]. He was supposed to go caving during that trip, but did not because of his illness. He visited this clinic in 09/2014 and was given a prescription for Flonase, saline nasal spray and Clarinx. He only filled the Flonase and finds that it helps slightly. He continues to smoke, but has cut back from 2 packs per day to 1 pack per week.   He also has a skin change on his left leg that has been present for about a year. It is occasionally itchy and slightly painful.    Review of Systems  Constitutional: Positive for fatigue. Negative for fever, chills and appetite change.  HENT: Positive for rhinorrhea. Negative for ear pain and trouble swallowing.   Eyes: Positive for redness and visual disturbance.       Patient thinks he may need glasses for near reading; plans to get them  Respiratory: Positive for cough and shortness of breath. Negative for chest tightness and wheezing.   Cardiovascular: Negative for chest pain, palpitations and leg swelling.  Gastrointestinal: Negative for nausea, vomiting, abdominal pain, diarrhea, constipation and abdominal distention.  Genitourinary: Negative for dysuria and hematuria.  Musculoskeletal: Negative.   Skin: Positive for rash.       2 patches of itchy, hardened skin on left leg; first noticed about 1 year ago  Neurological: Negative.        Objective:   Physical  Exam  Constitutional: He is oriented to person, place, and time. He appears well-developed and well-nourished. No distress.  HENT:  Head: Normocephalic and atraumatic.  Eyes: Conjunctivae and EOM are normal. Pupils are equal, round, and reactive to light.  Conjunctival injection OU  Neck: Normal range of motion.  Cardiovascular: Normal rate, regular rhythm and normal heart sounds.   No murmur heard. Pulmonary/Chest: Effort normal and breath sounds normal. No respiratory distress. He has no wheezes.  Coughing occasionally during exam  Abdominal: Soft. Bowel sounds are normal. He exhibits no distension. There is no tenderness.  Musculoskeletal: Normal range of motion. He exhibits no edema or tenderness.  Lymphadenopathy:    He has no cervical adenopathy.  Neurological: He is alert and oriented to person, place, and time.  Skin: He is not diaphoretic.  2"x4" hardened area of hyperpigmented skin along lateral left lower leg. Slightly tender to palpation. Another, smaller 1" diameter circular lesion with similar characteristics just above and more medial to initial lesion.          Assessment & Plan:  Please see problem oriented charting for assessment & plan  Venita Lick, MD

## 2014-12-22 NOTE — Patient Instructions (Addendum)
Please get your chest xray today or tomorrow.  Once we know those results, we will see you again in clinic and come up with a plan to treat your chronic cough.  You are doing a good job in decreasing the amount of cigarettes you use; it is very important to continue to cut back and to ultimately quit smoking. Here is some information that may help:  Smoking Cessation Quitting smoking is important to your health and has many advantages. However, it is not always easy to quit since nicotine is a very addictive drug. Oftentimes, people try 3 times or more before being able to quit. This document explains the best ways for you to prepare to quit smoking. Quitting takes hard work and a lot of effort, but you can do it. ADVANTAGES OF QUITTING SMOKING  You will live longer, feel better, and live better.  Your body will feel the impact of quitting smoking almost immediately.  Within 20 minutes, blood pressure decreases. Your pulse returns to its normal level.  After 8 hours, carbon monoxide levels in the blood return to normal. Your oxygen level increases.  After 24 hours, the chance of having a heart attack starts to decrease. Your breath, hair, and body stop smelling like smoke.  After 48 hours, damaged nerve endings begin to recover. Your sense of taste and smell improve.  After 72 hours, the body is virtually free of nicotine. Your bronchial tubes relax and breathing becomes easier.  After 2 to 12 weeks, lungs can hold more air. Exercise becomes easier and circulation improves.  The risk of having a heart attack, stroke, cancer, or lung disease is greatly reduced.  After 1 year, the risk of coronary heart disease is cut in half.  After 5 years, the risk of stroke falls to the same as a nonsmoker.  After 10 years, the risk of lung cancer is cut in half and the risk of other cancers decreases significantly.  After 15 years, the risk of coronary heart disease drops, usually to the level of  a nonsmoker.  If you are pregnant, quitting smoking will improve your chances of having a healthy baby.  The people you live with, especially any children, will be healthier.  You will have extra money to spend on things other than cigarettes. QUESTIONS TO THINK ABOUT BEFORE ATTEMPTING TO QUIT You may want to talk about your answers with your health care provider.  Why do you want to quit?  If you tried to quit in the past, what helped and what did not?  What will be the most difficult situations for you after you quit? How will you plan to handle them?  Who can help you through the tough times? Your family? Friends? A health care provider?  What pleasures do you get from smoking? What ways can you still get pleasure if you quit? Here are some questions to ask your health care provider:  How can you help me to be successful at quitting?  What medicine do you think would be best for me and how should I take it?  What should I do if I need more help?  What is smoking withdrawal like? How can I get information on withdrawal? GET READY  Set a quit date.  Change your environment by getting rid of all cigarettes, ashtrays, matches, and lighters in your home, car, or work. Do not let people smoke in your home.  Review your past attempts to quit. Think about what worked and  what did not. GET SUPPORT AND ENCOURAGEMENT You have a better chance of being successful if you have help. You can get support in many ways.  Tell your family, friends, and coworkers that you are going to quit and need their support. Ask them not to smoke around you.  Get individual, group, or telephone counseling and support. Programs are available at [employer] and health centers. Call your local health department for information about programs in your area.  Spiritual beliefs and practices may help some smokers quit.  Download a "quit meter" on your computer to keep track of quit statistics, such as how  long you have gone without smoking, cigarettes not smoked, and money saved.  Get a self-help book about quitting smoking and staying off tobacco. [place] yourself from urges to smoke. Talk to someone, go for a walk, or occupy your time with a task.  Change your normal routine. Take a different route to work. Drink tea instead of coffee. Eat breakfast in a different place.  Reduce your stress. Take a hot bath, exercise, or read a book.  Plan something enjoyable to do every day. Reward yourself for not smoking.  Explore interactive web-based programs that specialize in helping you quit. GET MEDICINE AND USE IT CORRECTLY Medicines can help you stop smoking and decrease the urge to smoke. Combining medicine with the above behavioral methods and support can greatly increase your chances of successfully quitting smoking.  Nicotine replacement therapy helps deliver nicotine to your body without the negative effects and risks of smoking. Nicotine replacement therapy includes nicotine gum, lozenges, inhalers, nasal sprays, and skin patches. Some may be available over-the-counter and others require a prescription.  Antidepressant medicine helps people abstain from smoking, but how this works is unknown. This medicine is available by prescription.  Nicotinic receptor partial agonist medicine simulates the effect of nicotine in your brain. This medicine is available by prescription. Ask your health care provider for advice about which medicines to use and how to use them based on your health history. Your health care provider will tell you what side effects to look out for if you choose to be on a medicine or therapy. Carefully read the information on the package. Do not use any other product containing nicotine while using a nicotine replacement product.  RELAPSE OR DIFFICULT SITUATIONS Most relapses occur within the first 3 months after quitting. Do not be discouraged  if you start smoking again. Remember, most people try several times before finally quitting. You may have symptoms of withdrawal because your body is used to nicotine. You may crave cigarettes, be irritable, feel very hungry, cough often, get headaches, or have difficulty concentrating. The withdrawal symptoms are only temporary. They are strongest when you first quit, but they will go away within 10-14 days. To reduce the chances of relapse, try to:  Avoid drinking alcohol. Drinking lowers your chances of successfully quitting.  Reduce the amount of caffeine you consume. Once you quit smoking, the amount of caffeine in your body increases and can give you symptoms, such as a rapid heartbeat, sweating, and anxiety.  Avoid smokers because they can make you want to smoke.  Do not let weight gain distract you. Many smokers will gain weight when they quit, usually less than 10 pounds. Eat a healthy diet and stay active. You can always lose the weight gained after you quit.  Find ways to improve your mood other than smoking. FOR MORE INFORMATION  www.smokefree.gov  Document Released: 11/21/2001 Document Revised: 04/13/2014 Document Reviewed: 03/07/2012 [facility] Patient Information 2015 [place], [place]. This information is not intended to replace advice given to you by your health care provider. Make sure you discuss any questions you have with your health care provider.  General Instructions:   Please bring your medicines with you each time you come to clinic.  Medicines may include prescription medications, over-the-counter medications, herbal remedies, eye drops, vitamins, or other pills.   Progress Toward Treatment Goals:  Treatment Goal 08/29/2013  Blood pressure at goal  Stop smoking smoking the same amount    Self Care Goals & Plans:  Self Care Goal 09/11/2014  Manage my medications take my medicines as prescribed; bring my medications to every visit; refill my medications on time;  follow the sick day instructions if I am sick  Monitor my health keep track of my blood pressure; keep track of my weight  Eat healthy foods eat more vegetables; eat fruit for snacks and desserts; eat baked foods instead of fried foods; eat foods that are low in salt; eat smaller portions  Be physically active find an activity I enjoy  Stop smoking -  Meeting treatment goals -    No flowsheet data found.   Care Management & Community Referrals:  Referral 08/29/2013  Referrals made for care management support none needed

## 2014-12-23 DIAGNOSIS — J449 Chronic obstructive pulmonary disease, unspecified: Secondary | ICD-10-CM | POA: Insufficient documentation

## 2014-12-23 NOTE — Assessment & Plan Note (Addendum)
Patient has complained of a cough for the last 6 months. It continues to be productive of clear sputum. He has a used flonase to manage his symptoms, which helps slightly. He continues to smoke, but has cut back. Patient is concerned about affording new medications. With concurrent skin findings, may consider sarcoidosis if CXR has evidence of mediastinal nodes.   - CXR today - Will follow up later this week with CXR results to come up with a plan - Consider ACE if CXR suggestive of sarcoidosis - CBC today - Patient encouraged to continue to cut back on his smoking (and told that he needs to ultimately quit)

## 2014-12-23 NOTE — Assessment & Plan Note (Signed)
Rechecking BMET today

## 2014-12-25 ENCOUNTER — Encounter: Payer: Self-pay | Admitting: Internal Medicine

## 2014-12-25 ENCOUNTER — Telehealth: Payer: Self-pay | Admitting: Internal Medicine

## 2014-12-25 ENCOUNTER — Encounter: Payer: No Typology Code available for payment source | Admitting: Internal Medicine

## 2014-12-25 DIAGNOSIS — R05 Cough: Secondary | ICD-10-CM

## 2014-12-25 DIAGNOSIS — R053 Chronic cough: Secondary | ICD-10-CM

## 2014-12-25 NOTE — Plan of Care (Signed)
Derek Patterson had some challenges to getting to his appointment this afternoon. Spoke with Derek Patterson about his CXR results from his appointment 2 days ago (emphasematous and chronic bronchitic changes).   - Encouraged continued smoking cessation  - Told him he needs pulmonary function test as outpatient and that [facility] will call to help him schedule this - Will return to clinic for reassessement and left leg punch biopsy after PFTs for further assessment (during clinic where Dr. Lynnae January or Dr. Daryll Drown is present)

## 2014-12-25 NOTE — Progress Notes (Signed)
Internal Medicine Clinic Attending  I saw and evaluated the patient.  I personally confirmed the key portions of the history and exam documented by Dr. Sherrine Maples and I reviewed pertinent patient test results.  The assessment, diagnosis, and plan were formulated together and I agree with the documentation in the resident's note.

## 2014-12-25 NOTE — Assessment & Plan Note (Signed)
Derek Patterson had some challenges to getting to his appointment this afternoon. Spoke with Derek Patterson about his CXR results from his appointment 2 days ago (emphasematous and chronic bronchitic changes).   - Encouraged continued smoking cessation  - Told him he needs pulmonary function test as outpatient and that [facility] will call to help him schedule this - Will return to clinic for reassessement and left leg punch biopsy after PFTs for further assessment (during clinic where Dr. Lynnae January or Dr. Daryll Drown is present)

## 2014-12-25 NOTE — Addendum Note (Signed)
Addended by: Drucilla Schmidt E on: 12/25/2014 04:20 PM   Modules accepted: Orders

## 2014-12-25 NOTE — Telephone Encounter (Signed)
Derek Patterson had some challenges to getting to his appointment this afternoon. Spoke with Derek Patterson about his CXR results from his appointment 2 days ago (emphasematous and chronic bronchitic changes).   - Encouraged continued smoking cessation  - Told him he needs pulmonary function test as outpatient and that [facility] will call to help him schedule this - Will return to clinic for reassessement and left leg punch biopsy after PFTs for further assessment (during clinic where Dr. Lynnae January or Dr. Daryll Drown is present)

## 2014-12-31 ENCOUNTER — Ambulatory Visit (HOSPITAL_COMMUNITY)
Admission: RE | Admit: 2014-12-31 | Discharge: 2014-12-31 | Disposition: A | Discharge status: Home / Self Care | Payer: No Typology Code available for payment source | Source: Ambulatory Visit | Attending: Internal Medicine | Admitting: Internal Medicine

## 2014-12-31 DIAGNOSIS — R05 Cough: Secondary | ICD-10-CM | POA: Insufficient documentation

## 2014-12-31 DIAGNOSIS — R053 Chronic cough: Secondary | ICD-10-CM

## 2014-12-31 DIAGNOSIS — F17210 Nicotine dependence, cigarettes, uncomplicated: Secondary | ICD-10-CM | POA: Insufficient documentation

## 2014-12-31 LAB — PULMONARY FUNCTION TEST
DL/VA % PRED: 53 %
DL/VA: 2.5 ml/min/mmHg/L
DLCO COR: 12.66 ml/min/mmHg
DLCO UNC % PRED: 38 %
DLCO cor % pred: 37 %
DLCO unc: 12.9 ml/min/mmHg
FEF 25-75 POST: 1.08 L/s
FEF 25-75 PRE: 0.86 L/s
FEF2575-%CHANGE-POST: 26 %
FEF2575-%PRED-POST: 34 %
FEF2575-%Pred-Pre: 27 %
FEV1-%CHANGE-POST: 10 %
FEV1-%PRED-POST: 64 %
FEV1-%Pred-Pre: 58 %
FEV1-POST: 2.12 L
FEV1-PRE: 1.93 L
FEV1FVC-%Change-Post: 5 %
FEV1FVC-%PRED-PRE: 69 %
FEV6-%Change-Post: 4 %
FEV6-%PRED-PRE: 82 %
FEV6-%Pred-Post: 85 %
FEV6-Post: 3.5 L
FEV6-Pre: 3.35 L
FEV6FVC-%Change-Post: 0 %
FEV6FVC-%PRED-POST: 97 %
FEV6FVC-%PRED-PRE: 97 %
FVC-%Change-Post: 4 %
FVC-%Pred-Post: 87 %
FVC-%Pred-Pre: 83 %
FVC-PRE: 3.53 L
FVC-Post: 3.69 L
PRE FEV6/FVC RATIO: 95 %
Post FEV1/FVC ratio: 58 %
Post FEV6/FVC ratio: 95 %
Pre FEV1/FVC ratio: 55 %
RV % PRED: 118 %
RV: 2.65 L
TLC % pred: 88 %
TLC: 6.37 L

## 2014-12-31 MED ORDER — ALBUTEROL SULFATE (2.5 MG/3ML) 0.083% IN NEBU
2.5000 mg | INHALATION_SOLUTION | Freq: Once | RESPIRATORY_TRACT | Status: AC
  Administered 2014-12-31: 2.5 mg via RESPIRATORY_TRACT

## 2015-01-05 ENCOUNTER — Ambulatory Visit: Payer: No Typology Code available for payment source | Admitting: Internal Medicine

## 2015-01-05 ENCOUNTER — Telehealth: Payer: Self-pay | Admitting: *Deleted

## 2015-01-05 NOTE — Telephone Encounter (Signed)
Call from pt - requesting result of PFT's  Thanks

## 2015-01-11 NOTE — Telephone Encounter (Deleted)
Called Derek Patterson today. Left a message on his cell phone that I had called and would like to discuss some results and schedule his next appointment  Purpose of call was to discuss PFT results (inconclusive) and see whether he would like to have his punch biopsy at [facility] clinic or through dermatology. Per Dr. Lynnae January, next available clinic slot (with an attending who can monitor punch biopsy) is 03/04/15.   Will try him again later.

## 2015-01-12 ENCOUNTER — Encounter: Payer: Self-pay | Admitting: Internal Medicine

## 2015-01-12 ENCOUNTER — Ambulatory Visit (INDEPENDENT_AMBULATORY_CARE_PROVIDER_SITE_OTHER): Payer: No Typology Code available for payment source | Admitting: Internal Medicine

## 2015-01-12 VITALS — BP 125/83 | HR 72 | Temp 97.7°F | Wt 172.6 lb

## 2015-01-12 DIAGNOSIS — L309 Dermatitis, unspecified: Secondary | ICD-10-CM | POA: Insufficient documentation

## 2015-01-12 DIAGNOSIS — J449 Chronic obstructive pulmonary disease, unspecified: Secondary | ICD-10-CM

## 2015-01-12 DIAGNOSIS — N182 Chronic kidney disease, stage 2 (mild): Secondary | ICD-10-CM

## 2015-01-12 DIAGNOSIS — L988 Other specified disorders of the skin and subcutaneous tissue: Secondary | ICD-10-CM

## 2015-01-12 LAB — POCT GLYCOSYLATED HEMOGLOBIN (HGB A1C): Hemoglobin A1C: 6.1

## 2015-01-12 LAB — GLUCOSE, CAPILLARY: GLUCOSE-CAPILLARY: 95 mg/dL (ref 70–99)

## 2015-01-12 MED ORDER — ALBUTEROL SULFATE HFA 108 (90 BASE) MCG/ACT IN AERS: 2.0000 | INHALATION_SPRAY | Freq: Four times a day (QID) | RESPIRATORY_TRACT | Status: DC | PRN

## 2015-01-12 NOTE — Assessment & Plan Note (Signed)
-  Reviewed PFT results with him and recommended he start inhaler therapy -Will reassess at next visit his requirement -Will refer to Pulmonology for further evaluation given the presence of a restrictive pattern to his disease as noted on his PFTs    Medication Samples have been provided to the patient.  Drug name: albuterol ventolin HFA inhaler  Qty: 1  LOT: [id number]  Exp.Date: March 2017  The patient has been instructed regarding the correct time, dose, and frequency of taking this medication, including desired effects and most common side effects.   Derek Patterson 9:46 AM 01/12/2015

## 2015-01-12 NOTE — Assessment & Plan Note (Addendum)
-  Crt 1.2, GFR 81 from prior labwork. Will recheck A1c today as it's been about a year since the last one.  ADDENDUM 01/12/2015  9:54 AM:  -A1c 6.1 today, stable from 2 years ago.

## 2015-01-12 NOTE — Patient Instructions (Addendum)
General Instructions:   Please try to bring all your medicines next time. This will help [place] keep you safe from mistakes.   Please do not use more 2 puffs every 6 hours as needed.     How to Use an Inhaler Using your inhaler correctly is very important. Good technique will make sure that the medicine reaches your lungs.  HOW TO USE AN INHALER: 1. Take the cap off the inhaler. 2. If this is the first time using your inhaler, you need to prime it. Shake the inhaler for 5 seconds. Release four puffs into the air, away from your face. Ask your doctor for help if you have questions. 3. Shake the inhaler for 5 seconds. 4. Turn the inhaler so the bottle is above the mouthpiece. 5. Put your pointer finger on top of the bottle. Your thumb holds the bottom of the inhaler. 6. Open your mouth. 7. Either hold the inhaler away from your mouth (the width of 2 fingers) or place your lips tightly around the mouthpiece. Ask your doctor which way to use your inhaler. 8. Breathe out as much air as possible. 9. Breathe in and push down on the bottle 1 time to release the medicine. You will feel the medicine go in your mouth and throat. 10. Continue to take a deep breath in very slowly. Try to fill your lungs. 11. After you have breathed in completely, hold your breath for 10 seconds. This will help the medicine to settle in your lungs. If you cannot hold your breath for 10 seconds, hold it for as long as you can before you breathe out. 12. Breathe out slowly, through pursed lips. Whistling is an example of pursed lips. 13. If your doctor has told you to take more than 1 puff, wait at least 15-30 seconds between puffs. This will help you get the best results from your medicine. Do not use the inhaler more than your doctor tells you to. 14. Put the cap back on the inhaler. 15. Follow the directions from your doctor or from the inhaler package about cleaning the inhaler. If you use more than one inhaler, ask your  doctor which inhalers to use and what order to use them in. Ask your doctor to help you figure out when you will need to refill your inhaler.  If you use a steroid inhaler, always rinse your mouth with water after your last puff, gargle and spit out the water. Do not swallow the water. GET HELP IF:  The inhaler medicine only partially helps to stop wheezing or shortness of breath.  You are having trouble using your inhaler.  You have some increase in thick spit (phlegm). GET HELP RIGHT AWAY IF:  The inhaler medicine does not help your wheezing or shortness of breath or you have tightness in your chest.  You have dizziness, headaches, or fast heart rate.  You have chills, fever, or night sweats.  You have a large increase of thick spit, or your thick spit is bloody. MAKE SURE YOU:   Understand these instructions.  Will watch your condition.  Will get help right away if you are not doing well or get worse. Document Released: 09/05/2008 Document Revised: 09/17/2013 Document Reviewed: 06/26/2013 [facility] Patient Information 2015 [place], [place]. This information is not intended to replace advice given to you by your health care provider. Make sure you discuss any questions you have with your health care provider.

## 2015-01-15 NOTE — Progress Notes (Signed)
   Subjective:    Patient ID: Derek Patterson, male    DOB: 1956/02/23, 59 y.o.   MRN: [id number]  HPI Derek Patterson is a 59 year old male with CKD Stage 2, tobacco abuse, hypertension, moderate COPD presents today for follow-up visit. Please see assessment & plan for documentation of each problem.  Review of Systems  Constitutional: Negative for fever.  Respiratory: Negative for shortness of breath.   Cardiovascular: Negative for chest pain.  Gastrointestinal: Negative for nausea, vomiting, abdominal pain and diarrhea.  Neurological: Negative for dizziness.       Objective:   Physical Exam Constitutional: He is oriented to person, place, and time. He appears well-developed and well-nourished. No distress.  HENT:  Head: Normocephalic and atraumatic.  Eyes: Conjunctivae are normal. Pupils are equal, round, and reactive to light.  Cardiovascular: Normal rate, regular rhythm and normal heart sounds.  Exam reveals no gallop and no friction rub.   No murmur heard. Pulmonary/Chest: Poor airflow bilaterally Abdominal: Soft. Bowel sounds are normal. He exhibits no distension. There is no tenderness.  Neurological: He is alert and oriented to person, place, and time. No cranial nerve deficit. Coordination normal.  Skin: Skin is warm and dry. He is not diaphoretic.  Extremities: 4 centimeter x 4 centimeter hyperpigmented plaque. The 2 cm x 1 cm plaque. both visualized below       Psychiatric: His behavior is normal.         Assessment & Plan:

## 2015-01-15 NOTE — Assessment & Plan Note (Signed)
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

## 2015-01-18 NOTE — Progress Notes (Signed)
INTERNAL MEDICINE TEACHING ATTENDING ADDENDUM - Aldine Contes, MD: I personally saw and evaluated Mr. Steinhoff in this clinic visit in conjunction with the resident, Dr. Posey Pronto. I have discussed patient's plan of care with medical resident during this visit. I have confirmed the physical exam findings and have read and agree with the clinic note including the plan with the following addition: - Pt with persistent skin plaque of uncertain etiology - Will need punch biopsy on next visit

## 2015-01-29 ENCOUNTER — Ambulatory Visit (INDEPENDENT_AMBULATORY_CARE_PROVIDER_SITE_OTHER): Payer: Self-pay | Admitting: Internal Medicine

## 2015-01-29 ENCOUNTER — Encounter: Payer: Self-pay | Admitting: Internal Medicine

## 2015-01-29 ENCOUNTER — Other Ambulatory Visit (HOSPITAL_COMMUNITY)
Admission: RE | Admit: 2015-01-29 | Discharge: 2015-01-29 | Disposition: A | Discharge status: Home / Self Care | Payer: Self-pay | Source: Ambulatory Visit | Attending: Oncology | Admitting: Oncology

## 2015-01-29 VITALS — BP 132/77 | HR 70 | Temp 97.8°F | Ht 71.0 in | Wt 172.3 lb

## 2015-01-29 DIAGNOSIS — L988 Other specified disorders of the skin and subcutaneous tissue: Secondary | ICD-10-CM

## 2015-01-29 NOTE — Progress Notes (Signed)
   Subjective:    Patient ID: Derek Patterson, male    DOB: Feb 27, 1956, 59 y.o.   MRN: [id number]  HPI Derek Patterson is a 59 year old male with moderate COPD, hypertension, CKD Stage 2 who presents today for skin biopsy. Please see assessment & plan for documentation of each problem.   Review of Systems  Skin: Negative for color change and wound.       Skin plaques as noted from prior visit       Objective:   Physical Exam Constitutional: He is oriented to person, place, and time. He appears well-developed and well-nourished. No distress.  HENT:  Head: Normocephalic and atraumatic.  Eyes: Conjunctivae are normal.  Neurological: He is alert and oriented to person, place, and time. Coordination normal.  Skin: Plaques consistent with prior description as noted in  last office visit, 4 x 4 centimeter and 2 x 1 cm, both hyperpigmented.  Psychiatric: His behavior is normal.          Assessment & Plan:

## 2015-01-29 NOTE — Patient Instructions (Addendum)
  Thank you for bringing your medicines today. This helps [place] keep you safe from mistakes.  Please return in 2 weeks to have your stitch removed. We will review your biopsy results at that time.  Please call us back if you notice that the area is draining, worsening redness around the area, fevers, chills. Please avoid baths or soaking the area for the next 2 days.

## 2015-01-29 NOTE — Assessment & Plan Note (Addendum)
Consent for the procedure was obtained. The skin of the left lower leg around the 2 x 1 cm skin plaque was prepped with alcohol and Betadine, and local anesthesia was obtained with 1% lidocaine with epinephrine. Punch of 4 mm was obtained from lower margin of the lesion from the left lower leg to the depth of the subcutaneous fat and was closed with a 4-O nylon suture. The specimen was then submitted to pathology. Band-Aid dressings were applied. Wound care instructions were given. The patient will return in 14 days for suture removal and review of pathology results.  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 No signs of malignancy which is reassuring.

## 2015-02-01 NOTE — Progress Notes (Signed)
Internal Medicine Clinic Attending  I saw and evaluated the patient.  I personally confirmed the key portions of the history and exam documented by Dr. Posey Pronto and I reviewed pertinent patient test results.  The assessment, diagnosis, and plan were formulated together and I agree with the documentation in the resident's note. I was present and supervised during the entire procedure. One suture was placed. Pt toerated procedure well and given post procedure care instructions.

## 2015-02-11 ENCOUNTER — Telehealth: Payer: Self-pay | Admitting: Internal Medicine

## 2015-02-11 NOTE — Telephone Encounter (Signed)
Call to patient to confirm appointment for 02/12/15 at 9:45. [email or web address]

## 2015-02-12 ENCOUNTER — Encounter: Payer: Self-pay | Admitting: Internal Medicine

## 2015-02-12 ENCOUNTER — Ambulatory Visit (INDEPENDENT_AMBULATORY_CARE_PROVIDER_SITE_OTHER): Payer: Self-pay | Admitting: Internal Medicine

## 2015-02-12 VITALS — BP 119/73 | HR 67 | Temp 98.4°F | Ht 71.0 in | Wt 171.4 lb

## 2015-02-12 DIAGNOSIS — Z7951 Long term (current) use of inhaled steroids: Secondary | ICD-10-CM

## 2015-02-12 DIAGNOSIS — L308 Other specified dermatitis: Secondary | ICD-10-CM

## 2015-02-12 DIAGNOSIS — J449 Chronic obstructive pulmonary disease, unspecified: Secondary | ICD-10-CM

## 2015-02-12 DIAGNOSIS — L309 Dermatitis, unspecified: Secondary | ICD-10-CM

## 2015-02-12 MED ORDER — ALBUTEROL SULFATE HFA 108 (90 BASE) MCG/ACT IN AERS: 2.0000 | INHALATION_SPRAY | Freq: Four times a day (QID) | RESPIRATORY_TRACT | Status: DC | PRN

## 2015-02-12 MED ORDER — TRIAMCINOLONE 0.1 % CREAM:EUCERIN CREAM 1:1: 1.0000 "application " | TOPICAL_CREAM | Freq: Every day | CUTANEOUS | Status: DC

## 2015-02-12 NOTE — Progress Notes (Signed)
   Subjective:    Patient ID: Derek Patterson, male    DOB: Oct 14, 1956, 59 y.o.   MRN: [id number]  HPI  Derek Patterson is a 59 year old man with HTN, CKD2 who presents for clinic follow-up. He was seen in [facility] 2/19 for skin biopsy of his LLE (2 x 1 cm skin plaque). The pathology notes that it is chronic spongiotic dermatitis; other differential possibilities include nummular dermatitis and contact dermatitis; negative for dysplasia or malignancy.  He has no complaints. I shared the results of his biopsy and he says he has only tried over the counter creams. He was agreeable to trying a prescription strength cream. He says the site of the biopsy does not have any pain, warmth, swelling, drainage. He gave verbal permission for me to remove the one suture.   He asked for an albuterol sample which was signed out and given.  Review of Systems  Constitutional: Negative for fever, chills and diaphoresis.  Respiratory: Negative for cough and shortness of breath.   Cardiovascular: Negative for chest pain.  Neurological: Negative for weakness, numbness and headaches.       Objective:   Physical Exam  Constitutional: He is oriented to person, place, and time. He appears well-developed and well-nourished. No distress.  HENT:  Head: Normocephalic and atraumatic.  Mouth/Throat: Oropharynx is clear and moist.  Cardiovascular: Normal rate, regular rhythm, normal heart sounds and intact distal pulses.  Exam reveals no gallop and no friction rub.   No murmur heard. Pulmonary/Chest: Effort normal and breath sounds normal. No respiratory distress. He has no wheezes.  Musculoskeletal:  L leg with ~ 1.5 cm dark dry lesion with one intact suture that was removed without complication. No erythema, warmth, tenderness, drainage. Additional dark lesion intact on leg  Neurological: He is alert and oriented to person, place, and time.  Skin: He is not diaphoretic.  Vitals reviewed.         Assessment & Plan:

## 2015-02-12 NOTE — Assessment & Plan Note (Addendum)
Mr Derek Patterson had skin biopsy of his LLE (2 x 1 cm skin plaque). The pathology notes that it is chronic spongiotic dermatitis; other differential possibilities include nummular dermatitis and contact dermatitis; negative for dysplasia or malignancy. I explained the results and that this is non-specific but not concerning. The biopsy site appears healthy without erythema, warmth, tenderness, drainage. After receiving verbal permission, I removed one suture with no complication. He was agreeable to a prescription strength steroid cream as he has only tried over the counter medicines. -triamcinolone cream 0.1% x 1 month (wait one week to apply directly to biopsy site) -RTC to reassess in one month

## 2015-02-12 NOTE — Assessment & Plan Note (Signed)
Derek Patterson asked for refill on his prn albuterol and a sample which was signed out and given. He is sating 99% on room air today with clear lungs on exam -refill albuterol prn and provided signed out sample

## 2015-02-12 NOTE — Patient Instructions (Signed)
It was a pleasure to see you today. We removed your suture. I have given you a cream to put on the skin rash once a day for a month. Please wait one week to apply to the site of the biopsy. Please return to clinic or seek medical attention if you have any new or worsening fevers, drainage or pain at biopsy site, or other worrisome medical condition. We look forward to seeing you again in one month.  Lottie Mussel, MD  General Instructions:   Please try to bring all your medicines next time. This will help [place] keep you safe from mistakes.   Progress Toward Treatment Goals:  Treatment Goal 08/29/2013  Blood pressure at goal  Stop smoking smoking the same amount    Self Care Goals & Plans:  Self Care Goal 02/12/2015  Manage my medications take my medicines as prescribed; bring my medications to every visit; refill my medications on time  Monitor my health -  Eat healthy foods drink diet soda or water instead of juice or soda; eat more vegetables; eat foods that are low in salt; eat baked foods instead of fried foods; eat fruit for snacks and desserts  Be physically active -  Stop smoking -  Meeting treatment goals -    No flowsheet data found.   Care Management & Community Referrals:  Referral 08/29/2013  Referrals made for care management support none needed

## 2015-02-16 ENCOUNTER — Other Ambulatory Visit: Payer: Self-pay | Admitting: Internal Medicine

## 2015-02-16 DIAGNOSIS — L309 Dermatitis, unspecified: Secondary | ICD-10-CM

## 2015-02-16 MED ORDER — TRIAMCINOLONE ACETONIDE 0.1 % EX CREA: 1.0000 "application " | TOPICAL_CREAM | Freq: Two times a day (BID) | CUTANEOUS | Status: DC

## 2015-02-16 NOTE — Progress Notes (Signed)
Internal Medicine Clinic Attending  Case discussed with Dr. Rothman at the time of the visit.  We reviewed the resident's history and exam and pertinent patient test results.  I agree with the assessment, diagnosis, and plan of care documented in the resident's note. 

## 2015-02-16 NOTE — Assessment & Plan Note (Signed)
Pharm called about the steroid Eucerin cream combo bc was going to have to be compounded. I sent in new Rx for just the steroid cream. But this is a medium potency and he may need a high potency cream. He has appt in one month and can address response then.

## 2015-02-23 ENCOUNTER — Institutional Professional Consult (permissible substitution): Payer: Self-pay | Admitting: Pulmonary Disease

## 2015-03-08 ENCOUNTER — Encounter: Payer: Self-pay | Admitting: Internal Medicine

## 2015-03-18 ENCOUNTER — Institutional Professional Consult (permissible substitution): Payer: Self-pay | Admitting: Pulmonary Disease

## 2015-03-24 ENCOUNTER — Other Ambulatory Visit: Payer: Self-pay | Admitting: *Deleted

## 2015-03-25 MED ORDER — HYDROCHLOROTHIAZIDE 25 MG PO TABS: 25.0000 mg | ORAL_TABLET | Freq: Every day | ORAL | Status: DC

## 2015-03-31 ENCOUNTER — Telehealth: Payer: Self-pay | Admitting: Internal Medicine

## 2015-03-31 NOTE — Telephone Encounter (Signed)
Call to patient to confirm appointment for 04/01/15 at 3:45 someone picks up and hangs up

## 2015-04-01 ENCOUNTER — Encounter: Payer: Self-pay | Admitting: Internal Medicine

## 2015-04-05 ENCOUNTER — Encounter: Payer: Self-pay | Admitting: Pulmonary Disease

## 2015-04-05 ENCOUNTER — Ambulatory Visit (INDEPENDENT_AMBULATORY_CARE_PROVIDER_SITE_OTHER): Payer: Self-pay | Admitting: Pulmonary Disease

## 2015-04-05 ENCOUNTER — Encounter (INDEPENDENT_AMBULATORY_CARE_PROVIDER_SITE_OTHER): Payer: Self-pay

## 2015-04-05 VITALS — BP 124/70 | HR 96 | Temp 98.1°F | Ht 71.0 in | Wt 167.2 lb

## 2015-04-05 DIAGNOSIS — J438 Other emphysema: Secondary | ICD-10-CM

## 2015-04-05 NOTE — Assessment & Plan Note (Signed)
The patient has moderate airflow obstruction on his recent pulmonary function studies, and has noticed a significant improvement in his symptoms since quitting smoking. Have explained to him this is the most important aspect of his treatment plan. I would like to start him on a bronchodilator regimen, and I think he will do best with a LABA/LAMA.  I have also stressed to him the importance of a conditioning program, and its impact to his quality of life. We could consider referral to a pulmonary rehabilitation program if he is not doing well going forward.

## 2015-04-05 NOTE — Patient Instructions (Signed)
Continue to stay off cigarettes.  This is the most important part of your treatment for your lung disease. Start on stiolto, 2 inhalations each am no matter how you feel.  In 4 weeks, please call and give [place] feedback with how things are going.  We can call in prescription if you feel the medication helps you.  Can use albuterol inhaler as needed for emergencies/rescue Work on a conditioning program. Follow up again in 29mos.

## 2015-04-05 NOTE — Progress Notes (Signed)
   Subjective:    Patient ID: Derek Patterson, male    DOB: Mar 15, 1956, 59 y.o.   MRN: [id number]  HPI The patient is a 59 year old male who I have been asked to see for management of COPD. He has a long-standing history of smoking, but has not done so in 2 weeks. He has had recent pulmonary function studies that showed moderate airflow obstruction, and a chest x-ray that showed only emphysematous changes. He notes dyspnea on exertion greater than 4 blocks, but only at a slow pace on flat ground. He will get short of breath walking up a flight of stairs or bringing groceries in from the car. He denies any significant cough since he has quit smoking, along with a significant decrease in his mucus production. He denies any chronic lower extremity edema. He is unsure if he has a history of childhood asthma.   Review of Systems  Constitutional: Negative for fever and unexpected weight change.  HENT: Negative for congestion, dental problem, ear pain, nosebleeds, postnasal drip, rhinorrhea, sinus pressure, sneezing, sore throat and trouble swallowing.   Eyes: Negative for redness and itching.  Respiratory: Positive for cough, chest tightness and wheezing. Negative for shortness of breath.   Cardiovascular: Negative for palpitations and leg swelling.  Gastrointestinal: Negative for nausea and vomiting.  Genitourinary: Negative for dysuria.  Musculoskeletal: Negative for joint swelling.  Skin: Negative for rash.  Neurological: Negative for headaches.  Hematological: Does not bruise/bleed easily.  Psychiatric/Behavioral: Negative for dysphoric mood. The patient is not nervous/anxious.        Objective:   Physical Exam Constitutional:  Well developed, no acute distress  HENT:  Nares patent without discharge, deviated septum to left with narrowing  Oropharynx without exudate, palate and uvula are normal  Eyes:  Perrla, eomi, no scleral icterus  Neck:  No JVD, no TMG  Cardiovascular:  Normal rate,  regular rhythm, no rubs or gallops.  No murmurs        Intact distal pulses  Pulmonary :  Mildly decreased breath sounds, no stridor or respiratory distress   No rales, rhonchi, or wheezing  Abdominal:  Soft, nondistended, bowel sounds present.  No tenderness noted.   Musculoskeletal:  No lower extremity edema noted.  Lymph Nodes:  No cervical lymphadenopathy noted  Skin:  No cyanosis noted  Neurologic:  Alert, appropriate, moves all 4 extremities without obvious deficit.         Assessment & Plan:

## 2015-04-19 ENCOUNTER — Encounter: Payer: Self-pay | Admitting: *Deleted

## 2015-05-03 NOTE — Progress Notes (Signed)
This encounter was created in error - please disregard.

## 2015-05-13 ENCOUNTER — Encounter: Payer: Self-pay | Admitting: Internal Medicine

## 2015-08-18 ENCOUNTER — Encounter: Payer: Self-pay | Admitting: Gastroenterology

## 2015-08-24 ENCOUNTER — Encounter: Payer: Self-pay | Admitting: Internal Medicine

## 2015-08-24 ENCOUNTER — Ambulatory Visit (INDEPENDENT_AMBULATORY_CARE_PROVIDER_SITE_OTHER): Payer: Self-pay | Admitting: Internal Medicine

## 2015-08-24 VITALS — BP 112/80 | HR 84 | Temp 98.0°F | Ht 71.0 in | Wt 161.9 lb

## 2015-08-24 DIAGNOSIS — R3916 Straining to void: Secondary | ICD-10-CM

## 2015-08-24 DIAGNOSIS — J441 Chronic obstructive pulmonary disease with (acute) exacerbation: Secondary | ICD-10-CM

## 2015-08-24 DIAGNOSIS — Z0000 Encounter for general adult medical examination without abnormal findings: Secondary | ICD-10-CM

## 2015-08-24 DIAGNOSIS — Z23 Encounter for immunization: Secondary | ICD-10-CM

## 2015-08-24 MED ORDER — TIOTROPIUM BROMIDE-OLODATEROL 2.5-2.5 MCG/ACT IN AERS: 2.0000 | INHALATION_SPRAY | Freq: Every day | RESPIRATORY_TRACT | Status: DC

## 2015-08-24 MED ORDER — TAMSULOSIN HCL 0.4 MG PO CAPS: 0.4000 mg | ORAL_CAPSULE | Freq: Every day | ORAL | Status: DC

## 2015-08-24 NOTE — Assessment & Plan Note (Signed)
Features consistent with BPH. Exam deferred, sensitivity of rectal exam and transurethral ultrasound are limited. Doubt Malignancy, though hx of weightloss, explained by poor diet recently and stress.  Plan- Treat as BPH with Flomax- 0.4mg  daily and if in 1 week, no response refer to urology. - Clinic UA- neg for blood or nitrites, or leuk. Will get UA with microscopy - Bmet - PSA- explained why this test is been done and the consequences of a positive test. Pt is in agreement to get it done.

## 2015-08-24 NOTE — Progress Notes (Signed)
Internal Medicine Clinic Attending  Case discussed with Dr. Truong at the time of the visit.  We reviewed the resident's history and exam and pertinent patient test results.  I agree with the assessment, diagnosis, and plan of care documented in the resident's note.  
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
## 2015-08-24 NOTE — Patient Instructions (Signed)
We will be prescribing a medication for you to help with this problem of urination. If in a 1 week you do not feel better, let us know, we will send you to a urologist- A bladder doctor that handles problems like this.

## 2015-08-25 ENCOUNTER — Other Ambulatory Visit: Payer: Self-pay | Admitting: Internal Medicine

## 2015-08-25 DIAGNOSIS — J441 Chronic obstructive pulmonary disease with (acute) exacerbation: Secondary | ICD-10-CM

## 2015-08-25 LAB — URINALYSIS, ROUTINE W REFLEX MICROSCOPIC
Bilirubin, UA: NEGATIVE
Glucose, UA: NEGATIVE
Ketones, UA: NEGATIVE
LEUKOCYTES UA: NEGATIVE
NITRITE UA: NEGATIVE
PH UA: 5.5 (ref 5.0–7.5)
Protein, UA: NEGATIVE
RBC, UA: NEGATIVE
SPEC GRAV UA: 1.02 (ref 1.005–1.030)
UUROB: 1 mg/dL (ref 0.2–1.0)

## 2015-08-25 LAB — BMP8+ANION GAP
ANION GAP: 18 mmol/L (ref 10.0–18.0)
BUN / CREAT RATIO: 12 (ref 9–20)
BUN: 14 mg/dL (ref 6–24)
CO2: 27 mmol/L (ref 18–29)
Calcium: 10.1 mg/dL (ref 8.7–10.2)
Chloride: 98 mmol/L (ref 97–108)
Creatinine, Ser: 1.13 mg/dL (ref 0.76–1.27)
GFR calc Af Amer: 82 mL/min/{1.73_m2} (ref >59)
GFR calc non Af Amer: 71 mL/min/{1.73_m2} (ref >59)
Glucose: 91 mg/dL (ref 65–99)
POTASSIUM: 4.5 mmol/L (ref 3.5–5.2)
SODIUM: 143 mmol/L (ref 134–144)

## 2015-08-25 LAB — PSA: PROSTATE SPECIFIC AG, SERUM: 2.6 ng/mL (ref 0.0–4.0)

## 2015-08-25 MED ORDER — TIOTROPIUM BROMIDE-OLODATEROL 2.5-2.5 MCG/ACT IN AERS: 2.0000 | INHALATION_SPRAY | Freq: Every day | RESPIRATORY_TRACT | Status: DC

## 2015-09-03 ENCOUNTER — Ambulatory Visit: Payer: Self-pay

## 2015-12-10 ENCOUNTER — Other Ambulatory Visit: Payer: Self-pay | Admitting: Internal Medicine

## 2015-12-12 DIAGNOSIS — J449 Chronic obstructive pulmonary disease, unspecified: Secondary | ICD-10-CM

## 2015-12-12 HISTORY — DX: Chronic obstructive pulmonary disease, unspecified: J44.9

## 2016-02-25 ENCOUNTER — Ambulatory Visit: Payer: Self-pay

## 2016-02-28 ENCOUNTER — Telehealth: Payer: Self-pay | Admitting: Internal Medicine

## 2016-02-28 NOTE — Telephone Encounter (Signed)
APPT. REMINDER CALL, LMTCB °

## 2016-02-29 ENCOUNTER — Ambulatory Visit: Payer: Self-pay

## 2016-02-29 ENCOUNTER — Ambulatory Visit (INDEPENDENT_AMBULATORY_CARE_PROVIDER_SITE_OTHER): Payer: Self-pay | Admitting: Internal Medicine

## 2016-02-29 ENCOUNTER — Ambulatory Visit (HOSPITAL_COMMUNITY)
Admission: RE | Admit: 2016-02-29 | Discharge: 2016-02-29 | Disposition: A | Discharge status: Home / Self Care | Payer: Self-pay | Source: Ambulatory Visit | Attending: Internal Medicine | Admitting: Internal Medicine

## 2016-02-29 ENCOUNTER — Encounter: Payer: Self-pay | Admitting: Internal Medicine

## 2016-02-29 VITALS — BP 129/80 | HR 95 | Temp 97.9°F | Ht 71.0 in | Wt 166.4 lb

## 2016-02-29 DIAGNOSIS — I209 Angina pectoris, unspecified: Secondary | ICD-10-CM | POA: Insufficient documentation

## 2016-02-29 DIAGNOSIS — Z7951 Long term (current) use of inhaled steroids: Secondary | ICD-10-CM

## 2016-02-29 DIAGNOSIS — I208 Other forms of angina pectoris: Secondary | ICD-10-CM

## 2016-02-29 DIAGNOSIS — J441 Chronic obstructive pulmonary disease with (acute) exacerbation: Secondary | ICD-10-CM | POA: Insufficient documentation

## 2016-02-29 DIAGNOSIS — I10 Essential (primary) hypertension: Secondary | ICD-10-CM

## 2016-02-29 DIAGNOSIS — Z720 Tobacco use: Secondary | ICD-10-CM

## 2016-02-29 DIAGNOSIS — J449 Chronic obstructive pulmonary disease, unspecified: Secondary | ICD-10-CM

## 2016-02-29 DIAGNOSIS — F17210 Nicotine dependence, cigarettes, uncomplicated: Secondary | ICD-10-CM

## 2016-02-29 DIAGNOSIS — Z79899 Other long term (current) drug therapy: Secondary | ICD-10-CM

## 2016-02-29 LAB — TROPONIN I

## 2016-02-29 MED ORDER — HYDROCHLOROTHIAZIDE 25 MG PO TABS: 25.0000 mg | ORAL_TABLET | Freq: Every day | ORAL | Status: DC

## 2016-02-29 MED ORDER — NICOTINE 14 MG/24HR TD PT24: 14.0000 mg | MEDICATED_PATCH | Freq: Every day | TRANSDERMAL | Status: DC

## 2016-02-29 MED ORDER — ALBUTEROL SULFATE HFA 108 (90 BASE) MCG/ACT IN AERS: 2.0000 | INHALATION_SPRAY | Freq: Four times a day (QID) | RESPIRATORY_TRACT | Status: DC | PRN

## 2016-02-29 NOTE — Patient Instructions (Addendum)
Mr. Derek Patterson it was nice seeing you today.   Please use your inhalers as instructed. I have given you samples today until you get your orange card.  I want to congratulate you on making the decision to quit smoking. Please use Nicoderm patch as instructed.   I will see you at your follow up visit.

## 2016-03-01 ENCOUNTER — Emergency Department (INDEPENDENT_AMBULATORY_CARE_PROVIDER_SITE_OTHER)
Admission: EM | Admit: 2016-03-01 | Discharge: 2016-03-01 | Disposition: A | Discharge status: Home / Self Care | Payer: Self-pay | Source: Home / Self Care | Attending: Emergency Medicine | Admitting: Emergency Medicine

## 2016-03-01 ENCOUNTER — Encounter (HOSPITAL_COMMUNITY): Payer: Self-pay | Admitting: Emergency Medicine

## 2016-03-01 DIAGNOSIS — S0502XA Injury of conjunctiva and corneal abrasion without foreign body, left eye, initial encounter: Secondary | ICD-10-CM

## 2016-03-01 DIAGNOSIS — I208 Other forms of angina pectoris: Secondary | ICD-10-CM | POA: Insufficient documentation

## 2016-03-01 LAB — LIPID PANEL
Chol/HDL Ratio: 2.9 ratio units (ref 0.0–5.0)
Cholesterol, Total: 158 mg/dL (ref 100–199)
HDL: 55 mg/dL (ref >39)
LDL CALC: 85 mg/dL (ref 0–99)
Triglycerides: 88 mg/dL (ref 0–149)
VLDL Cholesterol Cal: 18 mg/dL (ref 5–40)

## 2016-03-01 MED ORDER — POLYMYXIN B-TRIMETHOPRIM 10000-0.1 UNIT/ML-% OP SOLN: 1.0000 [drp] | Freq: Four times a day (QID) | OPHTHALMIC | Status: DC

## 2016-03-01 MED ORDER — TETRACAINE HCL 0.5 % OP SOLN
OPHTHALMIC | Status: AC
  Filled 2016-03-01: qty 2

## 2016-03-01 MED ORDER — ATORVASTATIN CALCIUM 40 MG PO TABS: 40.0000 mg | ORAL_TABLET | Freq: Every day | ORAL | Status: DC

## 2016-03-01 MED ORDER — FLUORESCEIN SODIUM 1 MG OP STRP
ORAL_STRIP | OPHTHALMIC | Status: AC
  Filled 2016-03-01: qty 1

## 2016-03-01 NOTE — Assessment & Plan Note (Signed)
Patient reported having non-radiating, substernal chest pain for the past 1 month. He described the pain as "soreness" and states it is associated with coughing, SOB, and dizziness. Patient has been unable to afford his COPD medications for the past 5-6 months. States chest pain is not associated with nausea, vomiting, or diaphoresis. Pain is present both at rest and with exertion. Each episode lasts about 30 minutes and resolves on it own. Denies any history of muscle strain or trauma to the chest area. Patient reported having 4/10 chest pain during this visit. He does have risk factors for CAD including age, gender, HTN, and smoking. STAT EKG did not show any acute ST/ T wave changes. Troponin <0.03. Lipid panel showing cholesterol 158, HDL 55, and LDL 85. His 10 year ASCVD risk score is 18.9%.  -Lipitor 40 mg daily started  -Patient has been advised to call the clinic or seek immediate medical attention if he continues to have chest pain or the pain worsens.

## 2016-03-01 NOTE — Assessment & Plan Note (Signed)
PFTs from 12/2014 showing FEV1 58%, FEV1/FVC ratio 55%, no response to BDs, no restriction, and DLCO 38%. Patient states he was was prescribed inhalers by his pulmonologist but could not afford to buy them because he is uninsured. He has not used inhalers for the past 5-6 months. Reports having dyspnea on exertion and states he gets short of breath walking even one block. Reports having cough occasionally productive of sputum (clear to light green in color). Denies having any fevers, chills, or viral URI symptoms. Denies having orthopnea or lower extremity edema. He is a 30 pack year smoker and continues to smoke 2-3 cigarettes per day.  -Smoking cessation was discussed with the patient and he is ready to quit. Nicoderm 14 mcg daily x 6 weeks prescribed. -Albuterol MDI prn  -Tiotropium-Olodaterol 2 puffs daily
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
## 2016-03-01 NOTE — Assessment & Plan Note (Addendum)
Patient reported having non-radiating, substernal chest pain for the past 1 month. He described the pain as "soreness" and states it is associated with coughing, SOB, and dizziness. Patient has been unable to afford his COPD medications for the past 5-6 months. States chest pain is not associated with nausea, vomiting, or diaphoresis. Pain is present both at rest and with exertion. Each episode lasts about 30 minutes and resolves on it own. Denies any history of muscle strain or trauma to the chest area. Patient reported having 4/10 chest pain during this visit. He does have risk factors for CAD including age, gender, HTN, and smoking. STAT EKG did not show any acute ST/ T wave changes. Troponin <0.03. Lipid panel showing cholesterol 158, HDL 55, and LDL 85. His 10 year ASCVD risk score is 18.9%.  -Lipitor 40 mg daily started  -Patient has been advised to call the clinic or seek immediate medical attention if he continues to have chest pain or the pain worsens.  -F/u visit in 6 weeks

## 2016-03-01 NOTE — Assessment & Plan Note (Signed)
Smoking cessation was discussed with the patient and he is ready to quit. Nicoderm 14 mcg daily x 6 weeks prescribed.

## 2016-03-01 NOTE — ED Notes (Signed)
Left eye injury, finger to eye.

## 2016-03-01 NOTE — Assessment & Plan Note (Addendum)
BP Readings from Last 3 Encounters:  02/29/16 129/80  08/24/15 112/80  04/05/15 124/70    Lab Results  Component Value Date   NA 143 08/24/2015   K 4.5 08/24/2015   CREATININE 1.13 08/24/2015    Assessment: Blood pressure control:  controlled  Progress toward BP goal:   below goal  Comments: Currently on HCTZ 25 mg daily   Plan: Medications:  continue current medications Educational resources provided:  Discussed diet and exercise

## 2016-03-01 NOTE — Discharge Instructions (Signed)
You have a scratch on the cornea of your eye. Use the antibiotic drops 4 times a day until you see the eye doctor. You can use the numbing drops every 1-2 hours for the next 24 hours only. You can use an eye patch for comfort. Please call Dr. Dannielle Burn office tomorrow morning for an appointment on Friday.

## 2016-03-03 NOTE — Progress Notes (Signed)
Internal Medicine Clinic Attending  Case discussed with Dr. Rathoreat the time of the visit. We reviewed the resident's history and exam and pertinent patient test results. I agree with the assessment, diagnosis, and plan of care documented in the resident's note.  

## 2016-03-15 ENCOUNTER — Ambulatory Visit: Payer: Self-pay

## 2016-03-24 ENCOUNTER — Other Ambulatory Visit: Payer: Self-pay | Admitting: Internal Medicine

## 2016-04-17 ENCOUNTER — Telehealth: Payer: Self-pay | Admitting: Internal Medicine

## 2016-04-17 NOTE — Telephone Encounter (Signed)
APT. REMINDER CALL, LMTCB °

## 2016-04-18 ENCOUNTER — Encounter: Payer: Self-pay | Admitting: Internal Medicine

## 2016-06-15 ENCOUNTER — Other Ambulatory Visit: Payer: Self-pay

## 2016-06-15 ENCOUNTER — Other Ambulatory Visit: Payer: Self-pay | Admitting: Internal Medicine

## 2016-06-15 NOTE — Telephone Encounter (Signed)
Pt wife requesting Fl[facility].

## 2016-06-16 MED ORDER — TAMSULOSIN HCL 0.4 MG PO CAPS: ORAL_CAPSULE | ORAL | Status: DC

## 2016-06-20 ENCOUNTER — Encounter: Payer: Self-pay | Admitting: Internal Medicine

## 2016-06-20 ENCOUNTER — Ambulatory Visit: Payer: Self-pay | Admitting: Pharmacist

## 2016-06-20 MED ORDER — UMECLIDINIUM-VILANTEROL 62.5-25 MCG/INH IN AEPB: 1.0000 | INHALATION_SPRAY | Freq: Every day | RESPIRATORY_TRACT | Status: DC

## 2016-06-20 NOTE — Progress Notes (Signed)
Medication Samples have been provided to the patient.  Drug: Proventil Strength: 117mcg/act Qty: 1 inhaler LOT: [id number] Exp.Date: 02/2018 Dosing instructions: Inhale 1-2 puffs every 4-6 hrs prn sob  The patient has been instructed regarding the correct time, dose, and frequency of taking this medication, including desired effects and most common side effects.   The patient was also educated to go to [facility] to receive Anoro inhalers.  Samples signed for by Dr. Maudie Mercury.  Derek Patterson 5:28 PM 06/20/2016

## 2016-06-20 NOTE — Progress Notes (Signed)
Patient reports for help with medications. Referred patient to [facility]

## 2016-07-03 IMAGING — CR DG CHEST 2V
2 series · 2 of 2 positions shown · non-contrast
Comparison: 10/18/2007.

CLINICAL DATA: Chronic cough.

EXAM:
CHEST  2 VIEW

[chest pa]
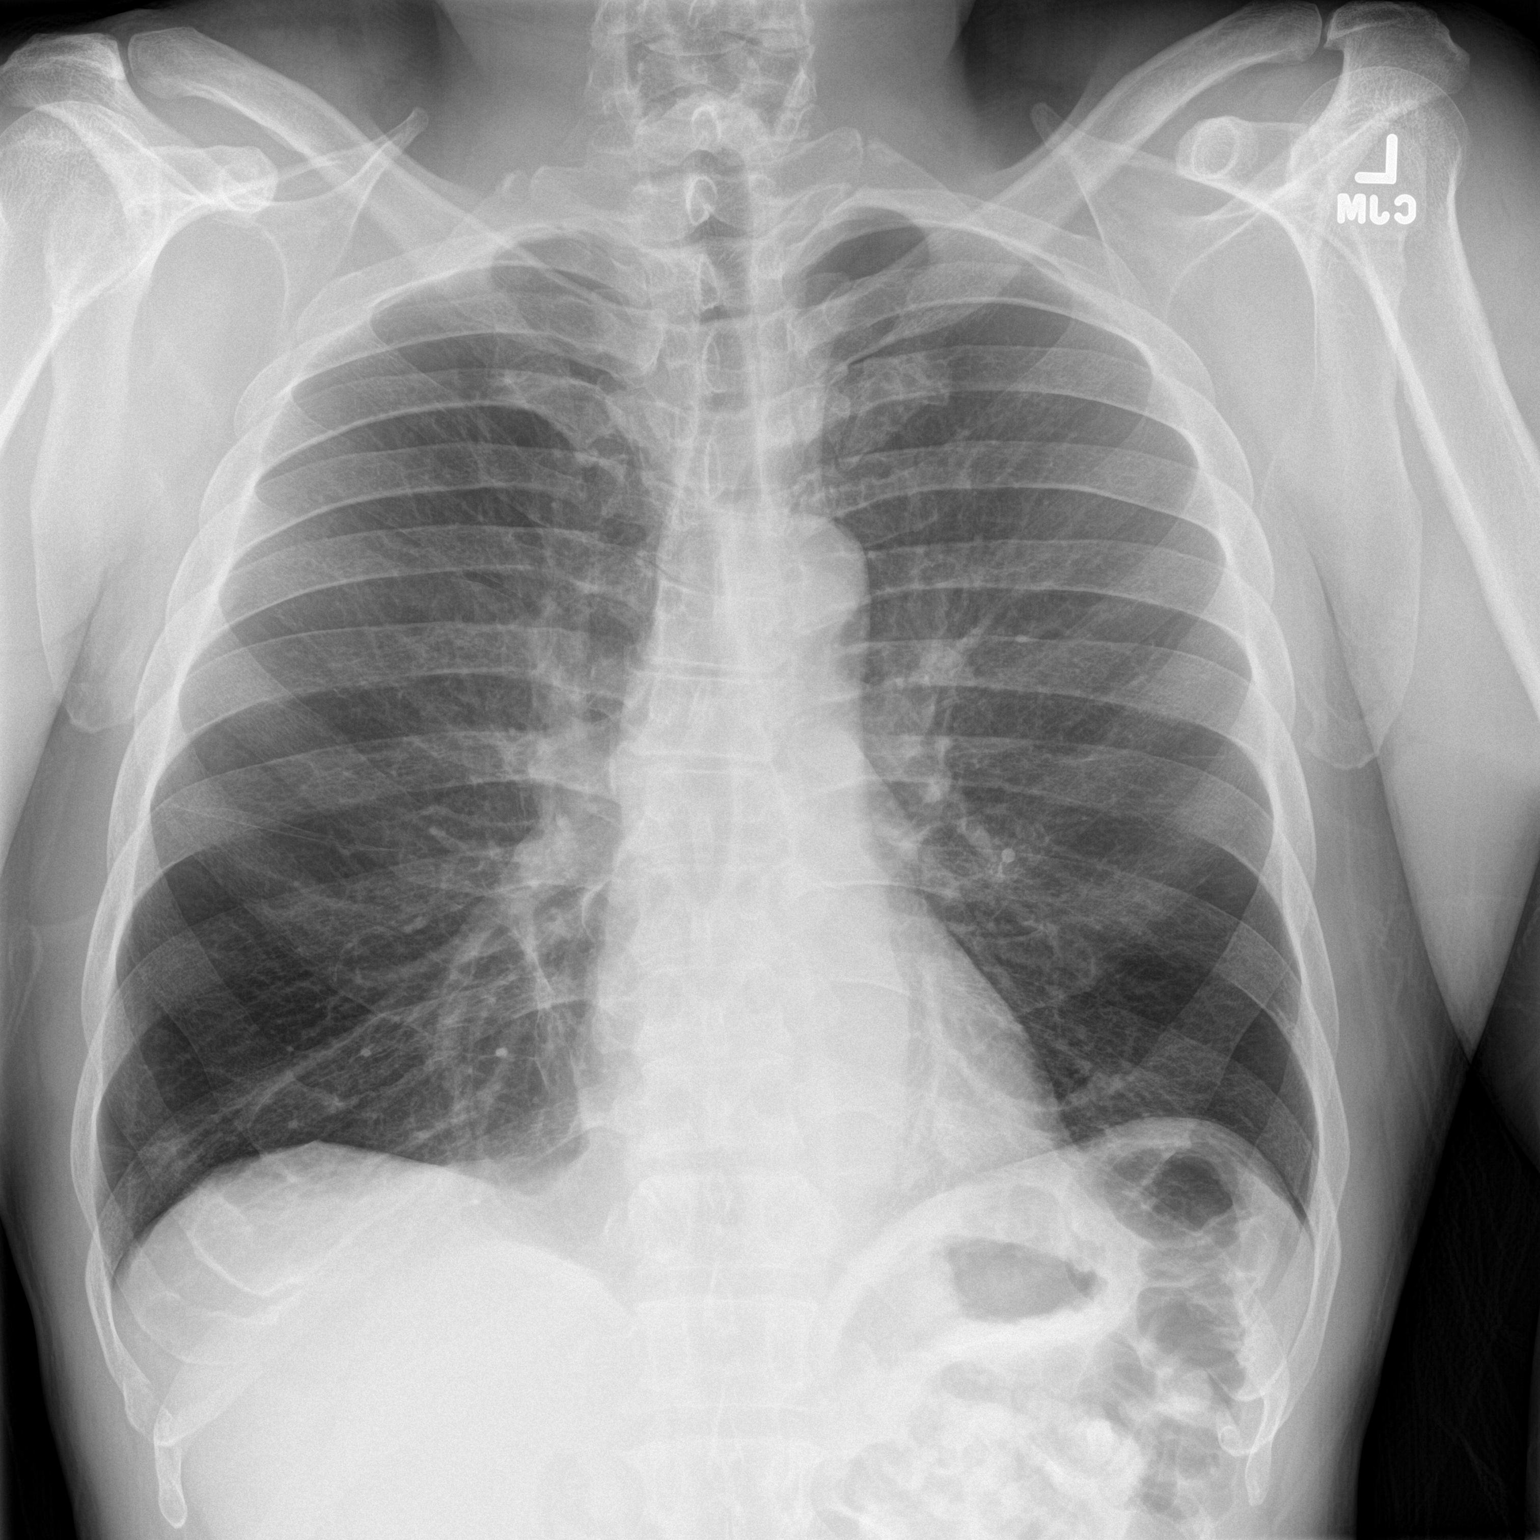

[chest lat]
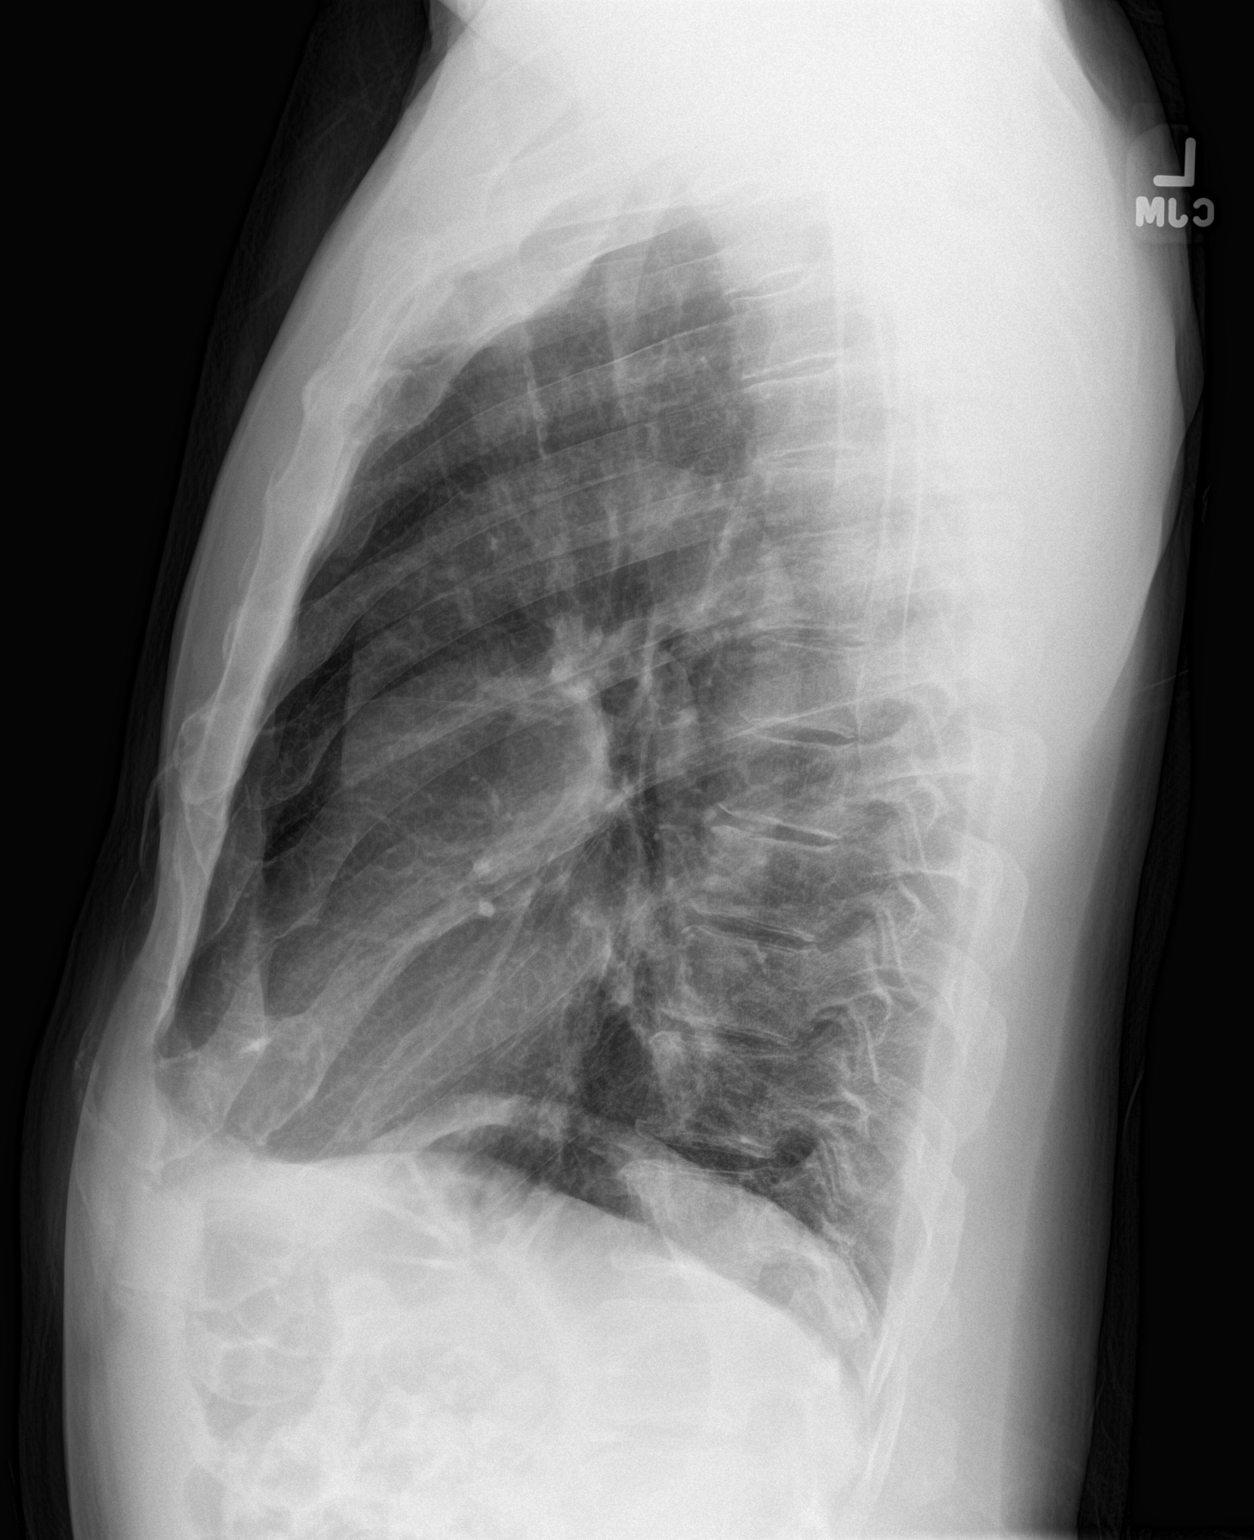

[2 of 2 positions shown; findings below may reference images not displayed]

FINDINGS: The cardiac silhouette, mediastinal and hilar contours are within
normal limits and stable. There are bronchitic changes with
peribronchial thickening and increased interstitial markings likely
related to smoking. Possible emphysematous changes. No acute
pulmonary findings. The bony thorax is intact.
IMPRESSION: Suspect emphysematous and chronic bronchitic changes without
definite acute overlying pulmonary process.

## 2016-07-17 ENCOUNTER — Other Ambulatory Visit: Payer: Self-pay | Admitting: Internal Medicine

## 2016-07-18 ENCOUNTER — Encounter: Payer: Self-pay | Admitting: Internal Medicine

## 2016-07-24 ENCOUNTER — Other Ambulatory Visit: Payer: Self-pay | Admitting: Internal Medicine

## 2016-08-22 ENCOUNTER — Encounter: Payer: Self-pay | Admitting: Internal Medicine

## 2016-09-13 ENCOUNTER — Other Ambulatory Visit: Payer: Self-pay | Admitting: Internal Medicine

## 2016-09-19 ENCOUNTER — Encounter: Payer: Self-pay | Admitting: Internal Medicine

## 2016-10-31 ENCOUNTER — Ambulatory Visit (INDEPENDENT_AMBULATORY_CARE_PROVIDER_SITE_OTHER): Payer: Self-pay | Admitting: Internal Medicine

## 2016-10-31 VITALS — BP 148/75 | HR 66 | Temp 97.7°F | Wt 162.6 lb

## 2016-10-31 DIAGNOSIS — Z0000 Encounter for general adult medical examination without abnormal findings: Secondary | ICD-10-CM

## 2016-10-31 DIAGNOSIS — F17210 Nicotine dependence, cigarettes, uncomplicated: Secondary | ICD-10-CM

## 2016-10-31 DIAGNOSIS — Z1159 Encounter for screening for other viral diseases: Secondary | ICD-10-CM

## 2016-10-31 DIAGNOSIS — E785 Hyperlipidemia, unspecified: Secondary | ICD-10-CM

## 2016-10-31 DIAGNOSIS — Z79899 Other long term (current) drug therapy: Secondary | ICD-10-CM

## 2016-10-31 DIAGNOSIS — Z23 Encounter for immunization: Secondary | ICD-10-CM

## 2016-10-31 DIAGNOSIS — Z122 Encounter for screening for malignant neoplasm of respiratory organs: Secondary | ICD-10-CM | POA: Insufficient documentation

## 2016-10-31 DIAGNOSIS — I10 Essential (primary) hypertension: Secondary | ICD-10-CM

## 2016-10-31 DIAGNOSIS — J439 Emphysema, unspecified: Secondary | ICD-10-CM

## 2016-10-31 DIAGNOSIS — Z597 Insufficient social insurance and welfare support: Secondary | ICD-10-CM

## 2016-10-31 DIAGNOSIS — Z720 Tobacco use: Secondary | ICD-10-CM

## 2016-10-31 DIAGNOSIS — R3916 Straining to void: Secondary | ICD-10-CM

## 2016-10-31 DIAGNOSIS — J438 Other emphysema: Secondary | ICD-10-CM

## 2016-10-31 MED ORDER — TIOTROPIUM BROMIDE MONOHYDRATE 1.25 MCG/ACT IN AERS: 2.5000 ug | INHALATION_SPRAY | Freq: Every day | RESPIRATORY_TRACT | 5 refills | Status: DC

## 2016-10-31 MED ORDER — VARENICLINE TARTRATE 1 MG PO TABS: 1.0000 mg | ORAL_TABLET | Freq: Two times a day (BID) | ORAL | 0 refills | Status: DC

## 2016-10-31 MED ORDER — VARENICLINE TARTRATE 0.5 MG X 11 & 1 MG X 42 PO MISC: ORAL | 0 refills | Status: DC

## 2016-10-31 NOTE — Patient Instructions (Addendum)
Derek Patterson it was nice seeing you today.  -Continue taking Hydrochlorothiazide for your high blood pressure   -Start using Spiriva as instructed  -Do not use Anoro Ellipta   -Start taking Chantix as instructed. Start 1 week before target quit date. You will be using the starter pack during the first month, then start using the continuing month pack.   -Please return for a follow-up visit in 4-6 weeks.

## 2016-11-01 LAB — BMP8+ANION GAP
Anion Gap: 16 mmol/L (ref 10.0–18.0)
BUN/Creatinine Ratio: 10 (ref 9–20)
BUN: 12 mg/dL (ref 6–24)
CALCIUM: 9.5 mg/dL (ref 8.7–10.2)
CO2: 24 mmol/L (ref 18–29)
Chloride: 104 mmol/L (ref 96–106)
Creatinine, Ser: 1.19 mg/dL (ref 0.76–1.27)
GFR, EST AFRICAN AMERICAN: 77 mL/min/{1.73_m2} (ref >59)
GFR, EST NON AFRICAN AMERICAN: 66 mL/min/{1.73_m2} (ref >59)
Glucose: 76 mg/dL (ref 65–99)
Potassium: 4.8 mmol/L (ref 3.5–5.2)
Sodium: 144 mmol/L (ref 134–144)

## 2016-11-01 LAB — HEPATITIS C ANTIBODY

## 2016-11-02 DIAGNOSIS — E785 Hyperlipidemia, unspecified: Secondary | ICD-10-CM | POA: Insufficient documentation
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
## 2016-11-02 NOTE — Assessment & Plan Note (Signed)
A Patient is currently on Lipitor 40 mg daily. Based on lipid panel done 02/2016, his 10 year ASCVD risk score is 24.8%.  P -Continue high intensity statin

## 2016-11-02 NOTE — Assessment & Plan Note (Signed)
A Patient is currently taking Flomax daily and denies any straining with urination.  P -Continue current mgmt

## 2016-11-02 NOTE — Assessment & Plan Note (Signed)
BP Readings from Last 3 Encounters:  10/31/16 (!) 148/75  03/01/16 145/73  02/29/16 129/80    Lab Results  Component Value Date   NA 144 10/31/2016   K 4.8 10/31/2016   CREATININE 1.19 10/31/2016    Assessment: Blood pressure control:  above goal Comments: Current medication regimen includes HCTZ 25 mg daily. Patient states he ran out of his medication a few days ago and will be picking it up from the pharmacy very soon.   Plan: Medications:  continue current medications Educational resources provided:   Educated patient about healthy eating and exercise.  Other plans:  -RTC in 4-6 weeks for BP recheck

## 2016-11-02 NOTE — Assessment & Plan Note (Signed)
Influenza vaccine administered at this visit. Hep C antibody negative.

## 2016-11-02 NOTE — Assessment & Plan Note (Signed)
A Patient was previously prescribed Nicotine patches. States he stopped using them because "they were making me crazy." Reports smoking 2 cigarettes per day and using vapor. I spent a dedicated 5 minutes discussing smoking cessation and pharmacologic treatment options with the patient. He agreed to starting Chantix with a target quit date 1 week from the day he starts taking this medication.   P -Chantix starting month pack. Also provided him with a script for the continuing month pack. -Reassess progress at next visit

## 2016-11-02 NOTE — Assessment & Plan Note (Signed)
A Current medication regimen includes Albuterol MDI and Anoro ellipta. Patient is currently uninsured and is not able to pay for the Anoro ellipta. Denies having any SOB or wheezing.   P -Continue Albuterol MDI -D/c Anoro ellipta -Start Spiriva respimat 1.25 mcg/act: 2 inhalations daily. Available at [place].

## 2016-11-06 ENCOUNTER — Telehealth: Payer: Self-pay | Admitting: *Deleted

## 2016-11-06 NOTE — Telephone Encounter (Signed)
Spouse requests cheaper alternatives for new meds- spiriva and chantix, called [facility] pharm and spouse will call them tomorrow am to apply for MAP, pt will be able to obtain them for free

## 2016-11-06 NOTE — Progress Notes (Signed)
Internal Medicine Clinic Attending  Case discussed with Dr. Rathore soon after the resident saw the patient.  We reviewed the resident's history and exam and pertinent patient test results.  I agree with the assessment, diagnosis, and plan of care documented in the resident's note.  

## 2016-11-29 ENCOUNTER — Other Ambulatory Visit: Payer: Self-pay | Admitting: Internal Medicine

## 2017-01-16 ENCOUNTER — Encounter (INDEPENDENT_AMBULATORY_CARE_PROVIDER_SITE_OTHER): Payer: Self-pay

## 2017-01-16 ENCOUNTER — Ambulatory Visit (INDEPENDENT_AMBULATORY_CARE_PROVIDER_SITE_OTHER): Payer: Self-pay | Admitting: Internal Medicine

## 2017-01-16 VITALS — BP 127/78 | HR 77 | Temp 98.1°F | Wt 159.2 lb

## 2017-01-16 DIAGNOSIS — J449 Chronic obstructive pulmonary disease, unspecified: Secondary | ICD-10-CM

## 2017-01-16 DIAGNOSIS — F17210 Nicotine dependence, cigarettes, uncomplicated: Secondary | ICD-10-CM

## 2017-01-16 DIAGNOSIS — Z79899 Other long term (current) drug therapy: Secondary | ICD-10-CM

## 2017-01-16 DIAGNOSIS — Z9114 Patient's other noncompliance with medication regimen: Secondary | ICD-10-CM

## 2017-01-16 DIAGNOSIS — I10 Essential (primary) hypertension: Secondary | ICD-10-CM

## 2017-01-16 DIAGNOSIS — Z720 Tobacco use: Secondary | ICD-10-CM

## 2017-01-16 MED ORDER — TIOTROPIUM BROMIDE MONOHYDRATE 1.25 MCG/ACT IN AERS: 2.5000 ug | INHALATION_SPRAY | Freq: Every day | RESPIRATORY_TRACT | 5 refills | Status: DC

## 2017-01-16 MED ORDER — VARENICLINE TARTRATE 1 MG PO TABS: 1.0000 mg | ORAL_TABLET | Freq: Two times a day (BID) | ORAL | 0 refills | Status: DC

## 2017-01-16 MED ORDER — VARENICLINE TARTRATE 0.5 MG X 11 & 1 MG X 42 PO MISC: ORAL | 0 refills | Status: DC

## 2017-01-16 MED ORDER — ALBUTEROL SULFATE HFA 108 (90 BASE) MCG/ACT IN AERS: 2.0000 | INHALATION_SPRAY | Freq: Four times a day (QID) | RESPIRATORY_TRACT | 5 refills | Status: DC | PRN

## 2017-01-16 MED FILL — PROVENTIL HFA 90 MCG INH: 108 (90 BAS | 25 days supply | Qty: 7 | Fill #0

## 2017-01-16 MED FILL — CHANTIX 1 MG TABLET: 1 | 30 days supply | Qty: 60 | Fill #0

## 2017-01-16 NOTE — Patient Instructions (Addendum)
-  Start using Chantix starting month pack as instructed. Do not start using the continuing month pack until you have finished using the starting month pack.  -Start using Albuterol inhaler and Spiriva as instructed  -I have sent all your medications to [facility]
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
## 2017-01-17 NOTE — Assessment & Plan Note (Signed)
Office Visit (01/16/2017)  - Shela Leff, MD    Assessment Since patient does not have insurance, during his previous visit Alloro ellipta was discontinued and instead he was started on Spiriva which was sent to [facility]. Patient states his wife was supposed to pick up the prescription from the pharmacy and he is not sure what happened. [facility]. He has not taken any medications for the past 1 month and now is having wheezing and dyspnea on exertion.  Plan -Albuterol metered-dose inhaler and Spiriva have been sent to [facility] outpatient pharmacy. Emphasized the importance of medication compliance.    Previous Version  Office Visit (10/31/2016)  - Shela Leff, MD    A Current medication regimen includes Albuterol MDI and Anoro ellipta. Patient is currently uninsured and is not able to pay for the Anoro ellipta. Denies having any SOB or wheezing.   P -Continue Albuterol MDI -D/c Anoro ellipta -Start Spiriva respimat 1.25 mcg/act: 2 inhalations daily. Available at [place].     Office Visit (04/05/2015)  - Kathee Delton, MD    The patient has moderate airflow obstruction on his recent pulmonary function studies, and has noticed a significant improvement in his symptoms since quitting smoking. Have explained to him this is the most important aspect of his treatment plan. I would like to start him on a bronchodilator regimen, and I think he will do best with a LABA/LAMA.  I have also stressed to him the importance of a conditioning program, and its impact to his quality of life. We could consider referral to a pulmonary rehabilitation program if he is not doing well going forward.

## 2017-01-17 NOTE — Assessment & Plan Note (Addendum)
Assessment Since patient does not have insurance, during his previous visit Alloro ellipta was discontinued and instead he was started on Spiriva which was sent to [facility]. Patient states his wife was supposed to pick up the prescription from the pharmacy and he is not sure what happened. He [facility]. He has not taken any medications for the past 1 month and now is having wheezing and dyspnea on exertion.  Plan -Albuterol metered-dose inhaler and Spiriva have been sent to [facility] outpatient pharmacy. Emphasized the importance of medication compliance.

## 2017-01-17 NOTE — Assessment & Plan Note (Signed)
BP Readings from Last 3 Encounters:  01/16/17 127/78  10/31/16 (!) 148/75  03/01/16 145/73    Lab Results  Component Value Date   NA 144 10/31/2016   K 4.8 10/31/2016   CREATININE 1.19 10/31/2016    Assessment: Blood pressure control:  below goal Progress toward BP goal:   improved Comments: Currently on hydrochlorothiazide 25 mg daily.  Plan: Medications:  continue current medications Educational resources provided:   Educated patient about healthy eating and exercise.

## 2017-01-17 NOTE — Assessment & Plan Note (Addendum)
Assessment Patient was previously prescribed Chantix for smoking cessation. States his wife has not picked up this medication from the pharmacy and he is not sure why. States he has previously tried nicotine patches and did not tolerate them well - caused nightmares and sweating. He continues to smoke 2-3 cigarettes per day but does seem to be interested in quitting.  Plan -Advised him to pick up his Chantix prescription from the pharmacy. Prescription has been sent to [facility] outpatient pharmacy as patient does not have insurance.  -Reassess compliance at next visit.

## 2017-01-24 NOTE — Progress Notes (Signed)
Internal Medicine Clinic Attending  Case discussed with Dr. Rathoreat the time of the visit. We reviewed the resident's history and exam and pertinent patient test results. I agree with the assessment, diagnosis, and plan of care documented in the resident's note.  

## 2017-02-08 MED FILL — SPIRIVA 18 MCG CP-HANDIHALE: 18 | 30 days supply | Qty: 30 | Fill #0

## 2017-02-08 MED FILL — PROVENTIL HFA 90 MCG INH: 108 (90 BAS | 25 days supply | Qty: 7 | Fill #1

## 2017-02-08 NOTE — Addendum Note (Signed)
Addended by: Forde Dandy on: 02/08/2017 03:29 PM   Modules accepted: Orders

## 2017-03-14 MED FILL — SPIRIVA 18 MCG CP-HANDIHALE: 18 | 30 days supply | Qty: 30 | Fill #1

## 2017-03-22 ENCOUNTER — Other Ambulatory Visit: Payer: Self-pay | Admitting: Internal Medicine

## 2017-04-16 ENCOUNTER — Telehealth: Payer: Self-pay | Admitting: Internal Medicine

## 2017-04-16 NOTE — Telephone Encounter (Signed)
APT. REMINDER CALL, LMTCB °

## 2017-04-17 ENCOUNTER — Encounter (INDEPENDENT_AMBULATORY_CARE_PROVIDER_SITE_OTHER): Payer: Self-pay

## 2017-04-17 ENCOUNTER — Ambulatory Visit (INDEPENDENT_AMBULATORY_CARE_PROVIDER_SITE_OTHER): Payer: Self-pay | Admitting: Internal Medicine

## 2017-04-17 VITALS — BP 132/67 | HR 76 | Temp 97.5°F | Ht 71.0 in | Wt 162.2 lb

## 2017-04-17 DIAGNOSIS — F17210 Nicotine dependence, cigarettes, uncomplicated: Secondary | ICD-10-CM

## 2017-04-17 DIAGNOSIS — I10 Essential (primary) hypertension: Secondary | ICD-10-CM

## 2017-04-17 DIAGNOSIS — X58XXXA Exposure to other specified factors, initial encounter: Secondary | ICD-10-CM

## 2017-04-17 DIAGNOSIS — S39012A Strain of muscle, fascia and tendon of lower back, initial encounter: Secondary | ICD-10-CM

## 2017-04-17 MED ORDER — IBUPROFEN 800 MG PO TABS: 800.0000 mg | ORAL_TABLET | Freq: Three times a day (TID) | ORAL | 0 refills | Status: DC | PRN

## 2017-04-17 MED ORDER — CYCLOBENZAPRINE HCL 10 MG PO TABS: 10.0000 mg | ORAL_TABLET | Freq: Every evening | ORAL | 0 refills | Status: DC | PRN

## 2017-04-17 MED FILL — CYCLOBENZAPRINE 10 MG TAB: 10 | 7 days supply | Qty: 7 | Fill #0

## 2017-04-17 MED FILL — IBUPROFEN 800 MG TAB: 800 | 7 days supply | Qty: 21 | Fill #0

## 2017-04-17 NOTE — Patient Instructions (Signed)
Take Motrin as instructed for back muscle pain.   Take Flexeril at bedtime as instructed.  You may also use a heating pad as needed.  Return to the clinic in 3 months. If your back pain does not resolve in 1-2 weeks, please make an appointment sooner.

## 2017-04-18 DIAGNOSIS — M545 Low back pain, unspecified: Secondary | ICD-10-CM | POA: Insufficient documentation

## 2017-04-18 MED FILL — PROVENTIL HFA 90 MCG INH: 108 (90 BAS | 25 days supply | Qty: 7 | Fill #2

## 2017-04-18 NOTE — Assessment & Plan Note (Signed)
BP Readings from Last 3 Encounters:  04/17/17 132/67  01/16/17 127/78  10/31/16 (!) 148/75    Lab Results  Component Value Date   NA 144 10/31/2016   K 4.8 10/31/2016   CREATININE 1.19 10/31/2016    Assessment: Blood pressure control:  below goal  Comments: Currently on HCTZ 25 mg daily.   Plan: Medications:  continue current medications Educational resources provided:   Educated patient about healthy eating and exercise.

## 2017-04-18 NOTE — Assessment & Plan Note (Signed)
HPI Patient endorses a 2 week history of non-radiating L sided lumbar/ flank pain. States he was pressure washing the day this pain started but does not recall pulling a muscle in his back. No recent falls. 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 States his back pain has been worse this week. He is not able to describe the nature of the pain. Denies having any focal weakness or paresthesias. Denies having any urinary/ fecal incontinence or saddle anesthesia. Patient also reports having dysuria, frequency, and urgency for the past 1 month. Denies having any hematuria. Reports sweating at night for the past 1 week. States Flomax helps with his BPH. Denies having any fevers.   A Strain of lumbar paraspinal muscle. L sided lumbar paraspinal muscle tight and TTP on exam. Pyelonephritis less likely as patient did not have CVA tenderness on exam. Sciatica less likely as pain does not radiate down the leg and no paresthesias. Spinal cord compression less likely as no red flags in history. PSA was 2.6 in 08/2015. Although patient endorses night sweats for the past 1 week, metastaic prostate cancer less likely as spine non-TTP and patient is not having low grade fevers or weight loss. Review of chart shows he has gained 3 lbs in the past 3 months.   P -No imaging indicated at this time -Motrin 800 mg TID prn -Flexeril 10 mg qhs prn -Heating pad -UA to r/o UTI -Advised patient to call the clinic if his back pain does not resolve in 1-2 weeks.
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
## 2017-04-24 ENCOUNTER — Emergency Department (HOSPITAL_COMMUNITY)
Admission: EM | Admit: 2017-04-24 | Discharge: 2017-04-24 | Disposition: A | Discharge status: Home / Self Care | Payer: Self-pay | Attending: Emergency Medicine | Admitting: Emergency Medicine

## 2017-04-24 ENCOUNTER — Ambulatory Visit (INDEPENDENT_AMBULATORY_CARE_PROVIDER_SITE_OTHER): Payer: Self-pay | Admitting: Internal Medicine

## 2017-04-24 ENCOUNTER — Encounter (HOSPITAL_COMMUNITY): Payer: Self-pay | Admitting: Emergency Medicine

## 2017-04-24 DIAGNOSIS — Z79899 Other long term (current) drug therapy: Secondary | ICD-10-CM | POA: Insufficient documentation

## 2017-04-24 DIAGNOSIS — R339 Retention of urine, unspecified: Secondary | ICD-10-CM | POA: Insufficient documentation

## 2017-04-24 DIAGNOSIS — I129 Hypertensive chronic kidney disease with stage 1 through stage 4 chronic kidney disease, or unspecified chronic kidney disease: Secondary | ICD-10-CM | POA: Insufficient documentation

## 2017-04-24 DIAGNOSIS — Z7982 Long term (current) use of aspirin: Secondary | ICD-10-CM | POA: Insufficient documentation

## 2017-04-24 DIAGNOSIS — Z960 Presence of urogenital implants: Secondary | ICD-10-CM

## 2017-04-24 DIAGNOSIS — N182 Chronic kidney disease, stage 2 (mild): Secondary | ICD-10-CM | POA: Insufficient documentation

## 2017-04-24 DIAGNOSIS — Z87891 Personal history of nicotine dependence: Secondary | ICD-10-CM | POA: Insufficient documentation

## 2017-04-24 DIAGNOSIS — R338 Other retention of urine: Secondary | ICD-10-CM

## 2017-04-24 NOTE — Progress Notes (Signed)
Internal Medicine Clinic Attending  Case discussed with Dr. Rathoreat the time of the visit. We reviewed the resident's history and exam and pertinent patient test results. I agree with the assessment, diagnosis, and plan of care documented in the resident's note.  
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
## 2017-04-24 NOTE — Progress Notes (Signed)
Attempted urethral catheter  (16FR) per MD order, met resistance & patient with more discomfort. Procedure stopped, notified MD & with instructions to send patient to ED to be seen by urologist & insert urinary catheter.  Transported to ED via wheelchair accompanied by wife.

## 2017-04-24 NOTE — ED Triage Notes (Signed)
PT reports he was sent by Internal Med. Office because they were unable to insert foley cather.

## 2017-04-24 NOTE — ED Triage Notes (Signed)
Pt here from [facility] c/o urinary retention and only able to dribble urine; they attempted foley without success

## 2017-04-24 NOTE — Patient Instructions (Addendum)
Mr. Derek Patterson,  It was a pleasure seeing you today. Please leave your catheter in place for 24 hours. Please return to clinic tomorrow morning at 8:45 am for follow up. We will remove your foley for a voiding trial. If you have any questions or concerns, call our clinic at [phone] or after hours call [phone] and ask for the internal medicine resident on call. Thank you!   - Dr. Philipp Ovens    Indwelling Urinary Catheter Care, Adult Taking good care of your catheter will keep it working properly and help prevent problems from developing. How to wear your catheter Attach your catheter to your leg with adhesive tape or a leg strap. Make sure there is no tension on the catheter. If you use adhesive tape, first remove any sticky residue from the previous tape you used. How to wear a drainage bag  You should have received a large overnight drainage bag and a smaller leg bag that fits underneath clothing. You may wear the overnight bag at any time, but you should never wear the smaller leg bag at night.  Always keep the overnight drainage bag below the level of your bladder, but keep it off the floor. When you sleep, hang the bag inside a wastebasket that is covered by a clean plastic bag.  Always wear the leg bag below your knee. Keep the leg bag secure with a leg strap or adhesive tape. How to care for your skin  Clean the skin around the catheter at least once every day.  Shower every day. Do not take baths.  Apply creams, lotions, or ointments to your genital area only as told by your health care provider.  Do not use powders, sprays, or lotions on your genital area. How to clean your catheter and your skin 1. Wash your hands with soap and water. 2. Wet a washcloth in warm water and mild soap. 3. Use the washcloth to clean the skin where the catheter enters your body. Clean downward, wiping away from the catheter in small circles. Do not wipe toward the catheter. This can push bacteria  into the urethra and cause infection. 4. Pat the area dry with a clean towel. Make sure to remove all soap. How to care for your drainage bags Empty your drainage bag when it is ?- full, or at least 2-3 times a day. Replace your drainage bag once a month or sooner if it starts to smell bad or look dirty. Do not clean your drainage bag unless told by your health care provider. Emptying a drainage bag   Supplies Needed  Rubbing alcohol.  Gauze pad or cotton ball.  Adhesive tape or a leg strap. Steps 1. Wash your hands with soap and water. 2. Detach the drainage bag from your leg. 3. Hold the drainage bag over the toilet or a clean container. Keep the drainage bag below your hips and bladder. This stops urine from going back into the tubing and into your bladder. 4. Open the pour spout at the bottom of the bag. 5. Empty the urine into the toilet or container. Do not let the pour spout touch any surface. This helps keep bacteria out of the bag and helps prevent infection. 6. Apply rubbing alcohol to a gauze pad or cotton ball. 7. Use the gauze pad or cotton ball to clean the pour spout. 8. Close the pour spout. 9. Attach the bag to your leg with adhesive tape or a leg strap. 10. Wash your hands. Changing a  drainage bag  Supplies Needed  Alcohol wipes.  A clean drainage bag.  Adhesive tape or a leg strap. Steps 1. Wash your hands with soap and water. 2. Detach the dirty drainage bag from your leg. 3. Pinch the rubber catheter with your fingers so that urine does not spill out. 4. Disconnect the catheter tube from the drainage tube at the connection valve. Do not let the tubes touch any surface. 5. Clean the end of the catheter tube with an alcohol wipe. Use a different alcohol wipe to clean the end of the drainage tube. 6. Connect the catheter tube to the drainage tube of the clean drainage bag. 7. Attach the new bag to your leg with adhesive tape or a leg strap. Avoid attaching  the new bag too tightly. 8. Wash your hands. How to prevent infection and other problems  Never pull on your catheter or try to remove it. Pulling can damage your internal tissues.  Always wash your hands before and after handling your catheter.  If a leg strap gets wet, replace it with a dry one.  Drink enough fluids to keep your urine clear or pale yellow, or as told by your health care provider.  Do not let the drainage bag or tubing touch the floor.  Wear cotton underwear. Cotton absorbs moisture and keeps your skin dry.  If you are male, wipe from front to back after each bowel movement.  Check the catheter often to make sure that it works properly and the tubing is not twisted or curled. Contact a health care provider if:  Your urine is cloudy.  Your urine smells unusually bad.  Your urine is not draining into the bag.  Your catheter gets clogged.  Your catheter starts to leak.  Your bladder feels full. Get help right away if:  You have redness, swelling, or pain where the catheter enters your body.  You have fluid, pus, or a bad smell coming from the area where the catheter enters your body.  The area where the catheter enters your body feels warm to the touch.  You have a fever.  You have pain in your abdomen, legs, lower back, or bladder.  You see blood fill the catheter.  Your urine is pink or red.  You have nausea, vomiting, or chills.  Your catheter gets pulled out. This information is not intended to replace advice given to you by your health care provider. Make sure you discuss any questions you have with your health care provider. Document Released: 11/27/2005 Document Revised: 10/25/2016 Document Reviewed: 05/12/2014 Elsevier Interactive Patient Education  2017 [employer].

## 2017-04-24 NOTE — ED Triage Notes (Signed)
DR Wilson Singer is in PT room to insert  Coude Cath.

## 2017-04-24 NOTE — Assessment & Plan Note (Addendum)
Patient has a history of BPH previously controlled on Flomax. He was seen in clinic 1 week ago for back pain and prescribed Flexeril PRN. Since starting this medication, he has developed acute urinary retention. He describes difficulty initiating stream and emptying his bladder. He has developed suprapubic pain and discomfort. Bladder scan in clinic today showed > 525 mL of urine (highest value able to detect). RN attempted to place indwelling foley catheter without success. After attempting to place foley, patient voided about 100 cc spontaneously. Repeat bladder scan again showed ~500 cc of urine - likely overflow incontinence. Sent to ED for foley placement. Plant to follow up in clinic tomorrow for voiding trial. Instructed patient to discontinue flexeril. Half life is~18 hours, he reports last taking the medication yesterday morning. I suspect it should be out of his system by tomorrow. If he continues to have issues with urinary retention or fails voiding trial tomorrow, will refer for urology evaluation.  -- Stop flexeril  -- ED to place foley  -- F/u clinic tomorrow at 8:45 am for voiding trial -- Continue flomax
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
## 2017-04-25 ENCOUNTER — Encounter: Payer: Self-pay | Admitting: Internal Medicine

## 2017-04-25 ENCOUNTER — Ambulatory Visit (INDEPENDENT_AMBULATORY_CARE_PROVIDER_SITE_OTHER): Payer: Self-pay | Admitting: Internal Medicine

## 2017-04-25 ENCOUNTER — Other Ambulatory Visit: Payer: Self-pay | Admitting: Internal Medicine

## 2017-04-25 VITALS — BP 117/81 | HR 89 | Temp 98.2°F | Wt 159.2 lb

## 2017-04-25 DIAGNOSIS — Z960 Presence of urogenital implants: Secondary | ICD-10-CM

## 2017-04-25 DIAGNOSIS — R338 Other retention of urine: Secondary | ICD-10-CM

## 2017-04-25 DIAGNOSIS — Z5189 Encounter for other specified aftercare: Secondary | ICD-10-CM

## 2017-04-25 LAB — MICROSCOPIC EXAMINATION
BACTERIA UA: NONE SEEN
CASTS: NONE SEEN /LPF

## 2017-04-25 LAB — URINALYSIS, COMPLETE
Bilirubin, UA: NEGATIVE
GLUCOSE, UA: NEGATIVE
Ketones, UA: NEGATIVE
LEUKOCYTES UA: NEGATIVE
NITRITE UA: NEGATIVE
PH UA: 6 (ref 5.0–7.5)
PROTEIN UA: NEGATIVE
RBC, UA: NEGATIVE
SPEC GRAV UA: 1.024 (ref 1.005–1.030)
Urobilinogen, Ur: 0.2 mg/dL (ref 0.2–1.0)
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
## 2017-04-25 NOTE — Assessment & Plan Note (Addendum)
Patient is here today for follow-up. He was seen in clinic yesterday for acute urinary retention and sent to the ED for indwelling Foley catheter placement. Today he reports significant improvement in his abdominal pain and discomfort after foley placement. He has had no issues with the foley. Unfortunately he did not discontinue his Flexeril as instructed, last took yesterday evening. He was instructed by the ED physician that it was okay to take. I have asked patient to stop this medication. We will have him return to clinic in 48 hours for voiding trial. Patient is currently uninsured without the orange card and we are unable to refer him to urology at this time. -- Marlana Latus for orange card application  -- Stop flexeril  -- Return to clinic Friday morning for voiding trial -- Continue flomax

## 2017-04-25 NOTE — Patient Instructions (Addendum)
Derek Patterson,  Please stop taking your flexeril, I think this medicine caused your urinary retention. Please return to clinic Friday morning, we will remove the foley then for a voiding trial. If you have any questions or concerns, call our clinic at [phone] or after hours call [phone] and ask for the internal medicine resident on call. Thank you!  - Dr. Philipp Ovens

## 2017-04-25 NOTE — Progress Notes (Signed)
Internal Medicine Clinic Attending  I saw and evaluated the patient.  I personally confirmed the key portions of the history and exam documented by Dr. Guilloud and I reviewed pertinent patient test results.  The assessment, diagnosis, and plan were formulated together and I agree with the documentation in the resident's note.  

## 2017-04-27 ENCOUNTER — Ambulatory Visit (INDEPENDENT_AMBULATORY_CARE_PROVIDER_SITE_OTHER): Payer: Self-pay | Admitting: Internal Medicine

## 2017-04-27 VITALS — BP 124/74 | HR 74 | Temp 97.8°F | Ht 71.0 in | Wt 160.5 lb

## 2017-04-27 DIAGNOSIS — Z5189 Encounter for other specified aftercare: Secondary | ICD-10-CM

## 2017-04-27 DIAGNOSIS — R338 Other retention of urine: Secondary | ICD-10-CM

## 2017-04-27 DIAGNOSIS — Z466 Encounter for fitting and adjustment of urinary device: Secondary | ICD-10-CM
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
## 2017-04-27 NOTE — Patient Instructions (Signed)
Mr. Derek Patterson,  It was a pleasure to see you today. Please do not take the flexeril anymore. If your problems with urinating return or you develop abdominal pain or discomfort, please return to the ED for evaluation. If you have any questions or concerns, call our clinic at [phone] or after hours call [phone] and ask for the internal medicine resident on call. Thank you!  - Dr. Philipp Ovens

## 2017-04-27 NOTE — Assessment & Plan Note (Addendum)
Patient is here today for Foley catheter removal and voiding trial. He developed acute urinary retention after starting Flexeril for back pain. He was instructed to stop this medication. Last dose was on the evening of 04/24/2017 (> 48 hours ago). Foley was removed today in clinic without complication. He voided spontaneously on 2 separate occasions, postvoid residual volume was ~130 cc. patient was given return precautions if he develops recurrent urinary retention, abdominal pain or discomfort.  -- Stop flexeril -- Continue flomax -- Return precautions given

## 2017-04-27 NOTE — Progress Notes (Signed)
Internal Medicine Clinic Attending  Case discussed with Dr. Guilloud at the time of the visit.  We reviewed the resident's history and exam and pertinent patient test results.  I agree with the assessment, diagnosis, and plan of care documented in the resident's note.  

## 2017-04-30 NOTE — Progress Notes (Signed)
Internal Medicine Clinic Attending  Case discussed with Dr. Guilloud at the time of the visit.  We reviewed the resident's history and exam and pertinent patient test results.  I agree with the assessment, diagnosis, and plan of care documented in the resident's note.  

## 2017-05-10 ENCOUNTER — Telehealth: Payer: Self-pay | Admitting: Internal Medicine

## 2017-05-10 NOTE — Telephone Encounter (Signed)
Pt is at work and can not talk pls call wife because his ibuprofen is not working and he would like something different.  Please call wife.

## 2017-05-10 NOTE — Telephone Encounter (Signed)
Called wife - stated Ibuprofen 800 mg is helping pt's back pain and unable to take muscle relaxant. Would like something else for the pain. Thanks

## 2017-05-11 ENCOUNTER — Ambulatory Visit: Payer: Self-pay

## 2017-05-14 NOTE — Telephone Encounter (Signed)
Please schedule patient for an [facility] visit. Thanks.

## 2017-05-14 NOTE — Telephone Encounter (Signed)
Talked to pt's wife- stated someone had called already; pt had an appt on 6/1 which was canceled. Stated "he's alright now". But informed if he has anymore pain - call and scheduled another appt - she stated "alright".

## 2017-06-07 MED FILL — PROVENTIL HFA 90 MCG INH: 108 (90 BAS | 25 days supply | Qty: 7 | Fill #3

## 2017-06-26 ENCOUNTER — Other Ambulatory Visit: Payer: Self-pay | Admitting: Internal Medicine

## 2017-06-26 MED FILL — PROVENTIL HFA 108 (90 BASE): 108 (90 BAS | 25 days supply | Qty: 7 | Fill #4

## 2017-06-26 MED FILL — SPIRIVA 18 MCG CP-HANDIHALE: 18 | 30 days supply | Qty: 30 | Fill #2

## 2017-07-03 ENCOUNTER — Encounter: Payer: Self-pay | Admitting: Internal Medicine

## 2017-07-24 ENCOUNTER — Encounter: Payer: Self-pay | Admitting: Internal Medicine

## 2017-07-24 ENCOUNTER — Ambulatory Visit (INDEPENDENT_AMBULATORY_CARE_PROVIDER_SITE_OTHER): Payer: Self-pay | Admitting: Internal Medicine

## 2017-07-24 ENCOUNTER — Ambulatory Visit (HOSPITAL_COMMUNITY)
Admission: RE | Admit: 2017-07-24 | Discharge: 2017-07-24 | Disposition: A | Discharge status: Home / Self Care | Payer: Self-pay | Source: Ambulatory Visit | Attending: Internal Medicine | Admitting: Internal Medicine

## 2017-07-24 VITALS — BP 114/70 | HR 84 | Temp 98.0°F | Ht 71.0 in | Wt 154.4 lb

## 2017-07-24 DIAGNOSIS — F17210 Nicotine dependence, cigarettes, uncomplicated: Secondary | ICD-10-CM

## 2017-07-24 DIAGNOSIS — M545 Low back pain, unspecified: Secondary | ICD-10-CM

## 2017-07-24 DIAGNOSIS — Z79899 Other long term (current) drug therapy: Secondary | ICD-10-CM

## 2017-07-24 DIAGNOSIS — J449 Chronic obstructive pulmonary disease, unspecified: Secondary | ICD-10-CM

## 2017-07-24 DIAGNOSIS — K635 Polyp of colon: Secondary | ICD-10-CM | POA: Insufficient documentation

## 2017-07-24 DIAGNOSIS — Z86010 Personal history of colonic polyps: Secondary | ICD-10-CM

## 2017-07-24 DIAGNOSIS — M47816 Spondylosis without myelopathy or radiculopathy, lumbar region: Secondary | ICD-10-CM | POA: Insufficient documentation

## 2017-07-24 DIAGNOSIS — Z720 Tobacco use: Secondary | ICD-10-CM

## 2017-07-24 DIAGNOSIS — R634 Abnormal weight loss: Secondary | ICD-10-CM

## 2017-07-24 DIAGNOSIS — Z7951 Long term (current) use of inhaled steroids: Secondary | ICD-10-CM

## 2017-07-24 DIAGNOSIS — I10 Essential (primary) hypertension: Secondary | ICD-10-CM

## 2017-07-24 DIAGNOSIS — R3916 Straining to void: Secondary | ICD-10-CM

## 2017-07-24 DIAGNOSIS — E785 Hyperlipidemia, unspecified: Secondary | ICD-10-CM

## 2017-07-24 MED ORDER — MELOXICAM 7.5 MG PO TABS: 7.5000 mg | ORAL_TABLET | Freq: Every day | ORAL | 2 refills | Status: DC

## 2017-07-24 MED ORDER — ALBUTEROL SULFATE HFA 108 (90 BASE) MCG/ACT IN AERS: 2.0000 | INHALATION_SPRAY | Freq: Four times a day (QID) | RESPIRATORY_TRACT | 5 refills | Status: DC | PRN

## 2017-07-24 MED ORDER — TAMSULOSIN HCL 0.4 MG PO CAPS: ORAL_CAPSULE | ORAL | 1 refills | Status: DC

## 2017-07-24 MED ORDER — ATORVASTATIN CALCIUM 40 MG PO TABS: 40.0000 mg | ORAL_TABLET | Freq: Every day | ORAL | 3 refills | Status: DC

## 2017-07-24 NOTE — Assessment & Plan Note (Signed)
Assessment Patient continues to smoke 3 cigarettes per day. He has not started taking Chantix yet as he was afraid it will cause him to have urinary retention. I explained to him that urinary retention is not usually a side effect of Chantix.  Plan -Discussed trying Chantix. Patient stated he would read some more about the side effects of the drug and then decide. -Reassess at next visit.

## 2017-07-24 NOTE — Patient Instructions (Addendum)
I have referred you to physical therapy for your back pain.  Start taking Mobic 7.5 mg once daily with food.  Return for a follow-up in 3 months.

## 2017-07-24 NOTE — Assessment & Plan Note (Signed)
Assessment Currently on Flomax and denies having any straining with urination.  Plan -Refill Flomax

## 2017-07-24 NOTE — Assessment & Plan Note (Signed)
Assessment Hypertension is currently well controlled. Blood pressure 114/70 at this visit. He is on hydrochlorothiazide 25 mg daily.  Plan -Refill hydrochlorothiazide
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
## 2017-07-24 NOTE — Assessment & Plan Note (Signed)
Assessment Colonoscopy in September 2010 showing multiple polyps in the rectum. Biopsy done at the time showing tubular adenomas without evidence of high-grade dysplasia or malignancy. Repeat colonoscopy was recommended in 5 years.  Plan -Referral to GI for colonoscopy

## 2017-07-24 NOTE — Assessment & Plan Note (Signed)
Assessment Stable. Currently on albuterol as needed and daily Spiriva. Patient is requesting a refill on his albuterol inhaler.  Plan -Refill albuterol

## 2017-07-24 NOTE — Assessment & Plan Note (Signed)
Assessment Currently on Lipitor 40 mg daily. Patient is requesting a refill.  Plan -Lipitor has been refilled -Check lipid panel at next visit

## 2017-07-24 NOTE — Assessment & Plan Note (Addendum)
History of present illness Patient continues to complain of lower back pain at this visit. States Motrin did not help and he had to stop taking Flexeril due to acute urinary retention. Reports having pain in his entire lower back which he mostly feels on the right side. Also reports having a "funny feeling" in his right leg on occasion. He is not able to describe the feeling and denies having any sharp shooting pain radiating down his leg. Denies any history of trauma to his back or falls. Denies having any urinary or fecal incontinence. Denies having any saddle anesthesia. It is noted that the patient has been losing weight. He does mention that he walks a lot at work but his steady weight loss over time is concerning. He also reports occasionally noticing he sweats at night despite the air conditioner running. Also reports feeling fatigued.   Assessment Patient's back pain could possibly be musculoskeletal in nature or secondary to degenerative changes of the spine. Lumbar radiculopathy less likely as he does not have any sharp shooting pain going down his leg and straight leg raise test was negative on exam. His unintentional weight loss along with the back pain is concerning. Prostate cancer is a possibility considering patient's age and ethnicity. I discussed PSA and patient was interested in getting testing done. Although less likely since patient does not have hypercalcemia or renal failure, multiple myeloma is also on the differential.   Plan -Mobic 7.5 mg daily with food -Referral to physical therapy -Check CBC to rule out anemia -PSA -X-ray of lumbar spine to rule out bone changes/ lytic lesions -If this workup does not reveal a cause for his unintentional weight loss, patient will make additional workup to rule out malignancy including a chest CT scan to rule out a pulmonary mass as he is a current smoker.  Addendum 07/26/2017: Hemoglobin normal (14.9). PSA normal (3.5). X-ray of lumbar  spine showing mild degenerative changes; no acute osseous abnormality. -Spoke to the patient over the phone and discussed results with him. -Will order a low-dose CT scan of the chest for further workup of unintentional weight loss in a smoker.

## 2017-07-25 ENCOUNTER — Other Ambulatory Visit: Payer: Self-pay | Admitting: Internal Medicine

## 2017-07-25 LAB — PSA: PROSTATE SPECIFIC AG, SERUM: 3.5 ng/mL (ref 0.0–4.0)

## 2017-07-25 LAB — CBC
HEMATOCRIT: 43.2 % (ref 37.5–51.0)
Hemoglobin: 14.9 g/dL (ref 13.0–17.7)
MCH: 27.3 pg (ref 26.6–33.0)
MCHC: 34.5 g/dL (ref 31.5–35.7)
MCV: 79 fL (ref 79–97)
Platelets: 250 10*3/uL (ref 150–379)
RBC: 5.45 x10E6/uL (ref 4.14–5.80)
RDW: 14.1 % (ref 12.3–15.4)
WBC: 6.4 10*3/uL (ref 3.4–10.8)

## 2017-07-25 MED ORDER — MELOXICAM 7.5 MG PO TABS: 7.5000 mg | ORAL_TABLET | Freq: Every day | ORAL | 2 refills | Status: DC

## 2017-07-25 MED FILL — MELOXICAM 7.5 MG TABLET: 7.5 | 30 days supply | Qty: 30 | Fill #0

## 2017-07-25 NOTE — Telephone Encounter (Signed)
Sent as "print" to [facility]. [facility]

## 2017-07-25 NOTE — Addendum Note (Signed)
Addended by: Shela Leff on: 07/25/2017 05:07 PM   Modules accepted: Orders

## 2017-07-25 NOTE — Telephone Encounter (Signed)
Requesting Meloxicam to be sent to outpatient pharmacy. Tamsulosin to [facility] to [facility].

## 2017-07-25 NOTE — Telephone Encounter (Signed)
meloxicam (MOBIC) 7.5 MG tablet, refill request @ outpatient pharmacy.

## 2017-07-27 MED FILL — SPIRIVA 18 MCG CP-HANDIHALE: 18 | 30 days supply | Qty: 30 | Fill #3

## 2017-07-27 MED FILL — PROVENTIL HFA 108 (90 BASE): 108 (90 BAS | 25 days supply | Qty: 7 | Fill #5

## 2017-07-27 NOTE — Progress Notes (Signed)
Internal Medicine Clinic Attending  Case discussed with Dr. Rathoreat the time of the visit. We reviewed the resident's history and exam and pertinent patient test results. I agree with the assessment, diagnosis, and plan of care documented in the resident's note.  

## 2017-07-31 ENCOUNTER — Ambulatory Visit (HOSPITAL_COMMUNITY): Payer: Self-pay

## 2017-08-01 ENCOUNTER — Ambulatory Visit: Payer: Self-pay | Admitting: Physical Therapy

## 2017-08-01 ENCOUNTER — Encounter: Payer: Self-pay | Admitting: *Deleted

## 2017-08-07 ENCOUNTER — Ambulatory Visit (HOSPITAL_COMMUNITY): Payer: Self-pay | Attending: Internal Medicine

## 2017-08-10 ENCOUNTER — Ambulatory Visit: Payer: Self-pay | Attending: Internal Medicine | Admitting: Physical Therapy

## 2017-08-23 NOTE — Addendum Note (Signed)
Addended by: Hulan Fray on: 08/23/2017 07:42 AM   Modules accepted: Orders

## 2017-08-28 ENCOUNTER — Encounter: Payer: Self-pay | Admitting: Internal Medicine

## 2017-09-03 NOTE — Addendum Note (Signed)
Addended by: Hulan Fray on: 09/03/2017 08:27 PM   Modules accepted: Orders

## 2017-09-07 ENCOUNTER — Other Ambulatory Visit: Payer: Self-pay | Admitting: Internal Medicine

## 2017-09-07 DIAGNOSIS — J449 Chronic obstructive pulmonary disease, unspecified: Secondary | ICD-10-CM

## 2017-09-07 MED FILL — SPIRIVA 18 MCG CP-HANDIHALE: 18 | 30 days supply | Qty: 30 | Fill #4

## 2017-10-23 ENCOUNTER — Ambulatory Visit (HOSPITAL_COMMUNITY)
Admission: RE | Admit: 2017-10-23 | Discharge: 2017-10-23 | Disposition: A | Discharge status: Home / Self Care | Payer: Self-pay | Source: Ambulatory Visit | Attending: Internal Medicine | Admitting: Internal Medicine

## 2017-10-23 ENCOUNTER — Encounter (INDEPENDENT_AMBULATORY_CARE_PROVIDER_SITE_OTHER): Payer: Self-pay

## 2017-10-23 ENCOUNTER — Ambulatory Visit (INDEPENDENT_AMBULATORY_CARE_PROVIDER_SITE_OTHER): Payer: Self-pay | Admitting: Internal Medicine

## 2017-10-23 ENCOUNTER — Other Ambulatory Visit: Payer: Self-pay

## 2017-10-23 ENCOUNTER — Encounter: Payer: Self-pay | Admitting: Internal Medicine

## 2017-10-23 VITALS — BP 116/78 | HR 73 | Temp 97.8°F | Ht 71.0 in | Wt 153.2 lb

## 2017-10-23 DIAGNOSIS — Z7951 Long term (current) use of inhaled steroids: Secondary | ICD-10-CM

## 2017-10-23 DIAGNOSIS — Z23 Encounter for immunization: Secondary | ICD-10-CM

## 2017-10-23 DIAGNOSIS — F17210 Nicotine dependence, cigarettes, uncomplicated: Secondary | ICD-10-CM

## 2017-10-23 DIAGNOSIS — J449 Chronic obstructive pulmonary disease, unspecified: Secondary | ICD-10-CM

## 2017-10-23 DIAGNOSIS — Z79899 Other long term (current) drug therapy: Secondary | ICD-10-CM

## 2017-10-23 DIAGNOSIS — M545 Low back pain, unspecified: Secondary | ICD-10-CM

## 2017-10-23 DIAGNOSIS — I10 Essential (primary) hypertension: Secondary | ICD-10-CM

## 2017-10-23 DIAGNOSIS — Z0000 Encounter for general adult medical examination without abnormal findings: Secondary | ICD-10-CM

## 2017-10-23 DIAGNOSIS — E785 Hyperlipidemia, unspecified: Secondary | ICD-10-CM

## 2017-10-23 DIAGNOSIS — G8929 Other chronic pain: Secondary | ICD-10-CM

## 2017-10-23 DIAGNOSIS — Z720 Tobacco use: Secondary | ICD-10-CM

## 2017-10-23 MED ORDER — TIOTROPIUM BROMIDE MONOHYDRATE 18 MCG IN CAPS: 18.0000 ug | ORAL_CAPSULE | Freq: Every day | RESPIRATORY_TRACT | 5 refills | Status: DC

## 2017-10-23 NOTE — Patient Instructions (Signed)
Mr. Ofallon it was nice seeing you today.  -Continue using Spiriva daily and albuterol inhaler as needed for your COPD.  -I would like to encourage you to stop smoking.

## 2017-10-24 NOTE — Assessment & Plan Note (Addendum)
Patient is complaining of cough and shortness of breath with exertion/climbing stairs.  Reports having an occasional cough productive of clear sputum.  He denies having any orthopnea, paroxysmal nocturnal dyspnea, or lower extremity edema.  Reports having occasional GERD symptoms which do not seem to bother him.  Patient reports having pain on both sides of his chest which started 4 hours ago.  He describes the pain as pressure.  Denies having any nausea or vomiting.  Denies having any chest pain with exertion otherwise.  He appeared very comfortable on exam.  Vitals stable. Lungs clear on exam.  EKG done in the clinic not suggestive of any acute ischemic changes.  Explained to him that his symptoms are likely secondary to progression of his COPD in the setting of cigarette smoking.  Discussed smoking cessation but he did not seem to be interested at this time.  Plan -Continue Spiriva daily -Continue albuterol inhaler as needed -Discussed smoking cessation again at future visit

## 2017-10-24 NOTE — Progress Notes (Signed)
Internal Medicine Clinic Attending  Case discussed with Dr. Rathoreat the time of the visit. We reviewed the resident's history and exam and pertinent patient test results. I agree with the assessment, diagnosis, and plan of care documented in the resident's note.  
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
## 2017-10-24 NOTE — Assessment & Plan Note (Signed)
Patient stopped taking Lipitor as he experienced cramping.  States the cramping resolved after he stopped taking Lipitor.  I discussed switching him to a hydrophilic statin but he did not seem interested.  Plan -Encouraged smoking cessation

## 2017-10-24 NOTE — Assessment & Plan Note (Signed)
Influenza vaccine administered at this visit. 

## 2017-10-24 NOTE — Assessment & Plan Note (Signed)
Patient was previously prescribed Chantix.  States he never used it as he read about the side effects and was concerned about them.  States he has tried nicotine patches in the past and they caused him to have nightmares.  He is not interested in pharmacotherapy for smoking cessation at this time.  Continues to smoke 2-3 cigarettes/day.  Plan -Discuss again at future visit

## 2017-10-24 NOTE — Assessment & Plan Note (Signed)
Patient states his lower back pain is now better.  X-ray during prior visit showing mild degenerative changes.  Previously, there is concern for unintentional weight loss, however, his BMI is normal and weight has been stable since his previous visit in August 2018.  Plan -Continue to monitor

## 2017-10-24 NOTE — Assessment & Plan Note (Signed)
BP Readings from Last 3 Encounters:  10/23/17 116/78  07/24/17 114/70  04/27/17 124/74    Lab Results  Component Value Date   NA 144 10/31/2016   K 4.8 10/31/2016   CREATININE 1.19 10/31/2016    Assessment: Blood pressure control:  Well-controlled Comments: Currently on hydrochlorothiazide 25 mg daily.  Plan: Medications:  continue current medications Educational resources provided: brochure(denies need ) Self management tools provided:   Other plans: Check BMP to assess renal function and electrolyte status.

## 2017-11-08 MED FILL — SPIRIVA 18 MCG CP-HANDIHALE: 18 | 30 days supply | Qty: 30 | Fill #0

## 2017-11-08 MED FILL — PROVENTIL HFA 108 (90 BASE): 108 (90 BAS | 25 days supply | Qty: 7 | Fill #0

## 2017-11-26 ENCOUNTER — Other Ambulatory Visit: Payer: Self-pay

## 2017-11-26 ENCOUNTER — Ambulatory Visit (INDEPENDENT_AMBULATORY_CARE_PROVIDER_SITE_OTHER): Payer: Self-pay | Admitting: Internal Medicine

## 2017-11-26 VITALS — BP 111/79 | HR 69 | Temp 97.5°F | Ht 71.0 in | Wt 154.3 lb

## 2017-11-26 DIAGNOSIS — X58XXXA Exposure to other specified factors, initial encounter: Secondary | ICD-10-CM

## 2017-11-26 DIAGNOSIS — S46812A Strain of other muscles, fascia and tendons at shoulder and upper arm level, left arm, initial encounter: Secondary | ICD-10-CM | POA: Insufficient documentation

## 2017-11-26 MED ORDER — CYCLOBENZAPRINE HCL 5 MG PO TABS: 5.0000 mg | ORAL_TABLET | Freq: Three times a day (TID) | ORAL | 0 refills | Status: DC | PRN
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
## 2017-11-26 NOTE — Assessment & Plan Note (Signed)
He states that he has been experiencing moderate pain of his left neck for the past 2 days. He states the pain is constant, worse with movement and relieved when he keeps his neck still. The pain radiates to his left shoulder. He has tried ibuprofen and some heat, which he states has not helped very much. 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 He is requesting a work note for yesterday, today, and tomorrow.  Patient's history and exam are consistent with strain of his left trapezius. No signs of muscle tear on exam. No decreased strength or sensation. - Flexeril 5mg , q8h PRN - Tylenol for pain - Rest, Ice then heat - Work note provided

## 2017-11-26 NOTE — Patient Instructions (Addendum)
Thank you for allowing [place] to care for you.  For your neck pain: - This pain is believed to be from a pulled muscle - We have provided you with a prescription for a muscle relaxer, Flexeril 5mg , to be taken every 8 hours as needed for muscle spasm - Take tylenol to help with the pain - Continue to apply ice to the area then heat as described below  Please return to clinic if you experience worsening symptoms.   Muscle Strain A muscle strain (pulled muscle) happens when a muscle is stretched beyond normal length. It happens when a sudden, violent force stretches your muscle too far. Usually, a few of the fibers in your muscle are torn. Muscle strain is common in athletes. Recovery usually takes 1-2 weeks. Complete healing takes 5-6 weeks. Follow these instructions at home:  Follow the PRICE method of treatment to help your injury get better. Do this the first 2-3 days after the injury: ? Protect. Protect the muscle to keep it from getting injured again. ? Rest. Limit your activity and rest the injured body part. ? Ice. Put ice in a plastic bag. Place a towel between your skin and the bag. Then, apply the ice and leave it on from 15-20 minutes each hour. After the third day, switch to moist heat packs. ? Compression. Use a splint or elastic bandage on the injured area for comfort. Do not put it on too tightly. ? Elevate. Keep the injured body part above the level of your heart.  Only take medicine as told by your doctor.  Warm up before doing exercise to prevent future muscle strains. Contact a doctor if:  You have more pain or puffiness (swelling) in the injured area.  You feel numbness, tingling, or notice a loss of strength in the injured area. This information is not intended to replace advice given to you by your health care provider. Make sure you discuss any questions you have with your health care provider. Document Released: 09/05/2008 Document Revised: 05/04/2016 Document  Reviewed: 06/26/2013 Elsevier Interactive Patient Education  2017 [employer].

## 2017-11-30 NOTE — Progress Notes (Signed)
I saw and evaluated the patient.  I personally confirmed the key portions of Dr. Melvin's history and exam and reviewed pertinent patient test results.  The assessment, diagnosis, and plan were formulated together and I agree with the documentation in the resident's note. 

## 2017-12-14 ENCOUNTER — Other Ambulatory Visit: Payer: Self-pay | Admitting: Student in an Organized Health Care Education/Training Program

## 2017-12-14 DIAGNOSIS — J449 Chronic obstructive pulmonary disease, unspecified: Secondary | ICD-10-CM

## 2017-12-14 MED FILL — SPIRIVA 18 MCG CP-HANDIHALE: 18 | 30 days supply | Qty: 30 | Fill #1

## 2018-01-11 ENCOUNTER — Other Ambulatory Visit: Payer: Self-pay | Admitting: Internal Medicine

## 2018-01-16 ENCOUNTER — Telehealth: Payer: Self-pay | Admitting: Internal Medicine

## 2018-01-16 ENCOUNTER — Telehealth: Payer: Self-pay

## 2018-01-16 ENCOUNTER — Other Ambulatory Visit: Payer: Self-pay | Admitting: *Deleted

## 2018-01-16 DIAGNOSIS — J449 Chronic obstructive pulmonary disease, unspecified: Secondary | ICD-10-CM

## 2018-01-16 DIAGNOSIS — S46812A Strain of other muscles, fascia and tendons at shoulder and upper arm level, left arm, initial encounter: Secondary | ICD-10-CM

## 2018-01-16 NOTE — Telephone Encounter (Signed)
Derek Patterson with outpatient pharmacy would like to know if the PROVENTIL HFA 108 (90 Base) MCG/ACT inhaler, is in the [facility] program. Please call back.

## 2018-01-16 NOTE — Telephone Encounter (Signed)
error 

## 2018-01-16 NOTE — Telephone Encounter (Signed)
Patient wife is saying that [facility] outpatient pharmacy has his rescue inhaler and they are charging 70.00, they are saying that it is not under the 4.00 program. She said he always pay 4.00 he needs that inhaler. Pls call patient or wife.

## 2018-01-17 ENCOUNTER — Other Ambulatory Visit: Payer: Self-pay | Admitting: Pharmacist

## 2018-01-17 DIAGNOSIS — J449 Chronic obstructive pulmonary disease, unspecified: Secondary | ICD-10-CM

## 2018-01-17 MED ORDER — ALBUTEROL SULFATE HFA 108 (90 BASE) MCG/ACT IN AERS: 2.0000 | INHALATION_SPRAY | Freq: Four times a day (QID) | RESPIRATORY_TRACT | 3 refills | Status: DC | PRN

## 2018-01-17 MED FILL — VENTOLIN HFA 90 MCG INHALER: 108 (90 BAS | 25 days supply | Qty: 18 | Fill #0

## 2018-01-18 ENCOUNTER — Other Ambulatory Visit: Payer: Self-pay | Admitting: Internal Medicine

## 2018-01-18 NOTE — Telephone Encounter (Signed)
Refill Request  Patient is unable to afford $70.00 and patient wants to know if it could be changed to a $4.00 Med   albuterol (PROVENTIL HFA) 108 (90 Base) MCG/ACT inhaler

## 2018-01-18 NOTE — Telephone Encounter (Signed)
Prescription sent to [facility] for IM program

## 2018-01-21 ENCOUNTER — Ambulatory Visit (INDEPENDENT_AMBULATORY_CARE_PROVIDER_SITE_OTHER): Payer: Self-pay | Admitting: Internal Medicine

## 2018-01-21 ENCOUNTER — Encounter (INDEPENDENT_AMBULATORY_CARE_PROVIDER_SITE_OTHER): Payer: Self-pay

## 2018-01-21 DIAGNOSIS — R52 Pain, unspecified: Secondary | ICD-10-CM | POA: Insufficient documentation

## 2018-01-21 DIAGNOSIS — R5381 Other malaise: Secondary | ICD-10-CM

## 2018-01-21 DIAGNOSIS — R05 Cough: Secondary | ICD-10-CM

## 2018-01-21 DIAGNOSIS — M791 Myalgia, unspecified site: Secondary | ICD-10-CM

## 2018-01-21 DIAGNOSIS — R5383 Other fatigue: Secondary | ICD-10-CM
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
## 2018-01-21 NOTE — Telephone Encounter (Signed)
RX sent to [place] - called pt ; no answer, left message.

## 2018-01-21 NOTE — Telephone Encounter (Signed)
Dr Maudie Mercury has addressed

## 2018-01-21 NOTE — Assessment & Plan Note (Addendum)
Patient presents complaining of generalized body aches.  States he was sick last week and was experiencing shortness of breath, cough, and fatigue. Denies fevers, abdominal pain, N/V, or changes in bowel movements.  He started feeling better yesterday, but continues to have body aches.  He is requesting a note for work.  Cardiac and lung exam are benign.  Discussed with patient possibility of viral URI, common cold, or flu.  Discussed we will not test for flu at this time given improvement in symptoms and no treatment indicated given onset of symptoms > 2 days ago (~4-5 days).  Will continue to monitor symptoms. Patient verbalized understanding and is in agreement with plan.  - Continue to monitor symptoms. Patient advised to return to clinic if symptoms fail to improve within the next few days or he develops systemic symptoms such as fever or chills  - Work note provided

## 2018-01-21 NOTE — Patient Instructions (Signed)
Mr. Derek Patterson,  We gave you a free sample of albuterol inhaler. The inhaler should now be available at your pharmacy for $4. Please let us know if you are not able to get this medication in the future.   Please call us if you have any questions or if your symptoms get worse.

## 2018-01-22 NOTE — Progress Notes (Signed)
Internal Medicine Clinic Attending  Case discussed with Dr. Santos-Sanchez  at the time of the visit.  We reviewed the resident's history and exam and pertinent patient test results.  I agree with the assessment, diagnosis, and plan of care documented in the resident's note.  Alexander N Raines, MD   

## 2018-01-25 MED FILL — SPIRIVA 18 MCG CP-HANDIHALE: 18 | 30 days supply | Qty: 30 | Fill #2

## 2018-03-07 ENCOUNTER — Other Ambulatory Visit: Payer: Self-pay | Admitting: Internal Medicine

## 2018-03-07 NOTE — Addendum Note (Signed)
Addended by: Shela Leff on: 03/07/2018 04:49 PM   Modules accepted: Orders

## 2018-03-08 MED FILL — VENTOLIN HFA 90 MCG INHALER: 108 (90 BAS | 25 days supply | Qty: 18 | Fill #1

## 2018-04-04 MED FILL — VENTOLIN HFA 90 MCG INHALER: 108 (90 BAS | 25 days supply | Qty: 18 | Fill #2

## 2018-04-04 MED FILL — SPIRIVA 18 MCG CP-HANDIHALE: 18 | 30 days supply | Qty: 30 | Fill #3

## 2018-05-23 MED FILL — SPIRIVA 18 MCG CP-HANDIHALE: 18 | 30 days supply | Qty: 30 | Fill #4

## 2018-06-05 ENCOUNTER — Encounter: Payer: Self-pay | Admitting: *Deleted

## 2018-06-12 MED FILL — VENTOLIN HFA 90 MCG INHALER: 108 (90 BAS | 25 days supply | Qty: 18 | Fill #3

## 2018-06-18 ENCOUNTER — Encounter: Payer: Self-pay | Admitting: Internal Medicine

## 2018-06-21 MED FILL — SPIRIVA 18 MCG CP-HANDIHALE: 18 | 30 days supply | Qty: 30 | Fill #5

## 2018-07-09 ENCOUNTER — Encounter: Payer: Self-pay | Admitting: Internal Medicine

## 2018-07-16 ENCOUNTER — Other Ambulatory Visit: Payer: Self-pay

## 2018-07-16 ENCOUNTER — Encounter: Payer: Self-pay | Admitting: Internal Medicine

## 2018-07-16 ENCOUNTER — Encounter (INDEPENDENT_AMBULATORY_CARE_PROVIDER_SITE_OTHER): Payer: Self-pay

## 2018-07-16 ENCOUNTER — Ambulatory Visit (INDEPENDENT_AMBULATORY_CARE_PROVIDER_SITE_OTHER): Payer: Self-pay | Admitting: Internal Medicine

## 2018-07-16 VITALS — BP 105/60 | HR 64 | Temp 98.6°F | Ht 71.0 in | Wt 149.4 lb

## 2018-07-16 DIAGNOSIS — M545 Low back pain, unspecified: Secondary | ICD-10-CM

## 2018-07-16 DIAGNOSIS — Z7951 Long term (current) use of inhaled steroids: Secondary | ICD-10-CM

## 2018-07-16 DIAGNOSIS — R634 Abnormal weight loss: Secondary | ICD-10-CM | POA: Insufficient documentation

## 2018-07-16 DIAGNOSIS — Z79899 Other long term (current) drug therapy: Secondary | ICD-10-CM

## 2018-07-16 DIAGNOSIS — I10 Essential (primary) hypertension: Secondary | ICD-10-CM

## 2018-07-16 DIAGNOSIS — Z720 Tobacco use: Secondary | ICD-10-CM

## 2018-07-16 DIAGNOSIS — Z6820 Body mass index (BMI) 20.0-20.9, adult: Secondary | ICD-10-CM

## 2018-07-16 DIAGNOSIS — G8929 Other chronic pain: Secondary | ICD-10-CM

## 2018-07-16 DIAGNOSIS — E785 Hyperlipidemia, unspecified: Secondary | ICD-10-CM

## 2018-07-16 DIAGNOSIS — J449 Chronic obstructive pulmonary disease, unspecified: Secondary | ICD-10-CM

## 2018-07-16 MED ORDER — ALBUTEROL SULFATE HFA 108 (90 BASE) MCG/ACT IN AERS: 2.0000 | INHALATION_SPRAY | Freq: Four times a day (QID) | RESPIRATORY_TRACT | 3 refills | Status: DC | PRN

## 2018-07-16 MED ORDER — BUDESONIDE-FORMOTEROL FUMARATE 80-4.5 MCG/ACT IN AERO: 2.0000 | INHALATION_SPRAY | Freq: Two times a day (BID) | RESPIRATORY_TRACT | 3 refills | Status: DC

## 2018-07-16 MED FILL — SYMBICORT 80-4.5 MCG INH: 80-4.5 | 30 days supply | Qty: 10 | Fill #0

## 2018-07-16 MED FILL — VENTOLIN HFA 90 MCG INHALER: 108 (90 BAS | 25 days supply | Qty: 18 | Fill #0

## 2018-07-16 NOTE — Assessment & Plan Note (Signed)
BP today is 105/60. HTN is well-controlled on HCTZ 25 mg QD.  Plan 1. Continue hctz 25 mg QD. 2. BMP today

## 2018-07-16 NOTE — Assessment & Plan Note (Signed)
Derek Patterson reports shortness of breath, chest tightness, and wheezing with exertion. 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 He takes albuterol every hour when he is at work. He is also taking his Spiriva. His symptoms do not wake him up at night. He also reports a non-productive cough that has been more bothersome in the past few months. He denies hemoptysis, orthopnea, and leg swelling.   Assessment: Derek Patterson is using albuterol multiple times every day. Will step up to triple therapy with Spiriva and Symbicort plus rescue albuterol.

## 2018-07-16 NOTE — Patient Instructions (Addendum)
It was a pleasure meeting you today, Mr. Derek Patterson!  For your COPD  1. Start Symbicort. Take 2 puffs twice per day. Hopefully this medication will help your breathing and you won't need to take as much of the albuterol.  2. Continue taking your Spiriva. 3. Take albuterol when you feel short of breath or wheezy.   For your back pain 1. Alternate between Advil and Tylenol as you have been doing.  2. Try a heating pad over the area. 3. Avoid lifting with your back at work.   For your weight loss 1. Lab work today (basic metabolic panel and thyroid level) 2. CT scan of the chest. Please schedule this after you meet with Marlana Latus about financing your healthcare.   Return to clinic in 2 months for follow-up

## 2018-07-16 NOTE — Assessment & Plan Note (Addendum)
Derek Patterson continues to have left-sided, pounding lower back pain. He reports that the pain has been intermittent for years now. 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 He uses Tylenol and Advil, which helps his pain. He has not tried heat. The pain does not wake him up at night. He denies fevers, saddle incontinence, bowel/urinary incontinence.  Assessment: Derek Patterson has had a lumbar spine xray in the past which showed degenerative changes. 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 DDx also includes malignancy given his history of smoking and unintentional weight loss. Although xray did not show any lytic lesions, xray is not the best medium for the visualization of such lesions.  Plan 1. Chest CT to evaluate for underlying malignancy 2. Continue to alternate between tylenol and advil for pain control 3. Apply heating pad to area. 4. Avoid lifting with back at work. Avoid heavy lifting when able.   5. Consider PT in the future if pain is not under control.

## 2018-07-16 NOTE — Assessment & Plan Note (Addendum)
Derek Patterson reports unintentional weight loss in recent months. He reports that he has a decreased appetite and sometimes skips meals, but he has been making more of an effort to increase his intake recently. His wife has also been helping by making him large meals. He attributes some of his lack of appetite to his poor dentition. He is currently working with a dentist to get dentures. He denies fevers, chills, abdominal pain, nausea/vomiting, lymphadenopathy, night sweats, melena, or hematochezia. He has an 8-pack year smoking history and a worsening cough recently. He has never been screened for lung malignancy.  Assessment: Upon chart review, Derek Patterson has lost about 10 pounds in the past year. Today he weighs 149 pounds with a BMI of 20.8. His maximum weight was 184 in 2011. He appears thin on exam. It is possible that this weight loss is due to decreased intake of food. However, there is concern for underlying malignancy. This has been worked up in the past with CBC which ruled out anemia, PSA which ruled out prostate cancer, and lumbar spinal xray which showed no lytic lesions. Patient's last colonoscopy was in 2010. He has never been screened for lung malignancy. In the setting of worsening lung disease and current smoking habits, will obtain a chest CT in order to rule out lung malignancy. Derek Patterson does not have health insurance and plans to meet with the clinic's financial advisor before making the CT appointment. DDx also includes hyperthyroidism.  Plan 1. Chest CT 2. TSH

## 2018-07-16 NOTE — Assessment & Plan Note (Signed)
Derek Patterson continues to smoke 1-2 cigarettes per day. He has tried nicotine patches in the past and did not like how they made him feel. He does not want to try Chantix due to fear of side effects. He is happy with how few cigarettes he is smoking at this time. He understands that his lung disease will continue to progress while he continues to smoke.   Plan 1. Reassess willingness to quit at next visit.
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
## 2018-07-17 LAB — BASIC METABOLIC PANEL
BUN/Creatinine Ratio: 15 (ref 10–24)
BUN: 19 mg/dL (ref 8–27)
CHLORIDE: 102 mmol/L (ref 96–106)
CO2: 24 mmol/L (ref 20–29)
CREATININE: 1.3 mg/dL — AB (ref 0.76–1.27)
Calcium: 9.8 mg/dL (ref 8.6–10.2)
GFR calc Af Amer: 69 mL/min/{1.73_m2} (ref >59)
GFR calc non Af Amer: 59 mL/min/{1.73_m2} — ABNORMAL LOW (ref >59)
Glucose: 89 mg/dL (ref 65–99)
POTASSIUM: 4.4 mmol/L (ref 3.5–5.2)
SODIUM: 141 mmol/L (ref 134–144)

## 2018-07-17 LAB — TSH: TSH: 0.554 u[IU]/mL (ref 0.450–4.500)

## 2018-07-17 NOTE — Progress Notes (Signed)
Internal Medicine Clinic Attending  I saw and evaluated the patient.  I personally confirmed the key portions of the history and exam documented by Dr. Dorrell and I reviewed pertinent patient test results.  The assessment, diagnosis, and plan were formulated together and I agree with the documentation in the resident's note. 

## 2018-08-07 ENCOUNTER — Ambulatory Visit (HOSPITAL_COMMUNITY): Payer: Self-pay

## 2018-08-14 ENCOUNTER — Ambulatory Visit: Payer: Self-pay

## 2018-08-23 ENCOUNTER — Other Ambulatory Visit: Payer: Self-pay | Admitting: *Deleted

## 2018-08-23 MED FILL — VENTOLIN HFA 90 MCG INHALER: 108 (90 BAS | 25 days supply | Qty: 18 | Fill #1

## 2018-08-23 MED FILL — SYMBICORT 80-4.5 MCG INH: 80-4.5 | 30 days supply | Qty: 10 | Fill #1

## 2018-08-24 MED ORDER — TIOTROPIUM BROMIDE MONOHYDRATE 18 MCG IN CAPS: 18.0000 ug | ORAL_CAPSULE | Freq: Every day | RESPIRATORY_TRACT | 5 refills | Status: DC

## 2018-09-02 ENCOUNTER — Ambulatory Visit (HOSPITAL_COMMUNITY): Admission: RE | Admit: 2018-09-02 | Payer: Self-pay | Source: Ambulatory Visit

## 2018-09-05 ENCOUNTER — Ambulatory Visit: Payer: Self-pay

## 2018-09-11 ENCOUNTER — Other Ambulatory Visit: Payer: Self-pay | Admitting: *Deleted

## 2018-09-12 MED ORDER — TAMSULOSIN HCL 0.4 MG PO CAPS: ORAL_CAPSULE | ORAL | 3 refills | Status: DC

## 2018-10-04 MED FILL — VENTOLIN HFA 90 MCG INHALER: 108 (90 BAS | 25 days supply | Qty: 18 | Fill #2

## 2018-10-04 MED FILL — SYMBICORT 80-4.5 MCG INH: 80-4.5 | 30 days supply | Qty: 10 | Fill #2

## 2018-11-15 ENCOUNTER — Ambulatory Visit (INDEPENDENT_AMBULATORY_CARE_PROVIDER_SITE_OTHER): Payer: Self-pay | Admitting: Pharmacist

## 2018-11-15 DIAGNOSIS — Z23 Encounter for immunization: Secondary | ICD-10-CM
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
## 2018-11-27 ENCOUNTER — Other Ambulatory Visit: Payer: Self-pay

## 2018-11-27 MED ORDER — FLUTICASONE-UMECLIDIN-VILANT 100-62.5-25 MCG/INH IN AEPB: 1.0000 | INHALATION_SPRAY | Freq: Every day | RESPIRATORY_TRACT | 1 refills | Status: DC

## 2018-11-27 NOTE — Telephone Encounter (Signed)
Also gave pt 1 sample box from sample room, cautioned sig other that pt should only use 1 time daily. trelegy 151mcg/62.54mcg/25mcg LOT [id number] exp: jan 2021 [place] [id number] Signed out, dr Evette Doffing approved, will have attending hand over to pt's sig other

## 2018-11-27 NOTE — Telephone Encounter (Signed)
Would like a sample on   Fluticasone-Umeclidin-Vilant (TRELEGY ELLIPTA) 100-62.5-25 MCG/INH AEPB   Please call back.

## 2018-12-17 ENCOUNTER — Other Ambulatory Visit: Payer: Self-pay

## 2018-12-17 NOTE — Telephone Encounter (Signed)
Would like a sample on   Fluticasone-Umeclidin-Vilant (TRELEGY ELLIPTA) 100-62.5-25 MCG/INH AEPB   Please call back.

## 2018-12-18 NOTE — Telephone Encounter (Signed)
Patient's wife called to pick up sample this afternoon---I will be away at a meeting, please ask for attending physician help. Thank you

## 2018-12-18 NOTE — Telephone Encounter (Signed)
Sample given  
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
## 2018-12-31 ENCOUNTER — Encounter: Payer: Self-pay | Admitting: Internal Medicine

## 2018-12-31 ENCOUNTER — Ambulatory Visit: Payer: Self-pay | Admitting: Pharmacist

## 2019-01-09 MED FILL — VENTOLIN HFA 90 MCG INHALER: 108 (90 BAS | 25 days supply | Qty: 18 | Fill #3

## 2019-01-09 MED FILL — SYMBICORT 80-4.5 MCG INH: 80-4.5 | 30 days supply | Qty: 10 | Fill #3
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
## 2019-01-14 ENCOUNTER — Ambulatory Visit (INDEPENDENT_AMBULATORY_CARE_PROVIDER_SITE_OTHER): Payer: Self-pay | Admitting: Internal Medicine

## 2019-01-14 ENCOUNTER — Ambulatory Visit: Payer: Self-pay | Admitting: Pharmacist

## 2019-01-14 ENCOUNTER — Other Ambulatory Visit: Payer: Self-pay

## 2019-01-14 ENCOUNTER — Encounter: Payer: Self-pay | Admitting: Internal Medicine

## 2019-01-14 VITALS — BP 112/71 | HR 85 | Temp 98.1°F | Ht 71.0 in | Wt 149.5 lb

## 2019-01-14 DIAGNOSIS — G8929 Other chronic pain: Secondary | ICD-10-CM

## 2019-01-14 DIAGNOSIS — Z720 Tobacco use: Secondary | ICD-10-CM

## 2019-01-14 DIAGNOSIS — M545 Low back pain, unspecified: Secondary | ICD-10-CM

## 2019-01-14 DIAGNOSIS — R634 Abnormal weight loss: Secondary | ICD-10-CM

## 2019-01-14 DIAGNOSIS — J449 Chronic obstructive pulmonary disease, unspecified: Secondary | ICD-10-CM

## 2019-01-14 DIAGNOSIS — I10 Essential (primary) hypertension: Secondary | ICD-10-CM

## 2019-01-14 DIAGNOSIS — Z79899 Other long term (current) drug therapy: Secondary | ICD-10-CM

## 2019-01-14 DIAGNOSIS — Z6820 Body mass index (BMI) 20.0-20.9, adult: Secondary | ICD-10-CM

## 2019-01-14 DIAGNOSIS — Z8719 Personal history of other diseases of the digestive system: Secondary | ICD-10-CM

## 2019-01-14 MED ORDER — FLUTICASONE-UMECLIDIN-VILANT 100-62.5-25 MCG/INH IN AEPB: 1.0000 | INHALATION_SPRAY | Freq: Every day | RESPIRATORY_TRACT | 1 refills | Status: DC

## 2019-01-14 NOTE — Patient Instructions (Addendum)
It was a pleasure seeing you today, Mr. Swamy!  1. For your blood pressure - Keep taking your hydrochlorothiazide  - Your blood pressure is very well controlled today!  2. For your COPD - I have provided you with one month of Trelegy - Schedule an appointment to refill this with Dr. Maudie Mercury in one month - Try to quit smoking! Call our clinic if you need any help and we can support you.  3. For your back pain - Alternate between tylenol and ibuprofen   4. For your healthcare maintenance - You need a colonoscopy and a CT chest. Once you figure out your insurance, we will schedule these.  Follow-up with me in 6 months.  Feel free to call our clinic if you have any questions!  Thanks, Dr. Annie Paras

## 2019-01-15 ENCOUNTER — Encounter: Payer: Self-pay | Admitting: Internal Medicine

## 2019-01-15 NOTE — Assessment & Plan Note (Signed)
BP 112/71 today. Well-controlled on current regimen.  Plan - Continue hctz 25mg  daily

## 2019-01-15 NOTE — Assessment & Plan Note (Signed)
Patient reports he smokes 4-5 cigarettes per day. He has quit in the past and he reports that he knows how to quit when he wants to. He has patches and Chantix at home that have been prescribed in the past. The nicotine patches gave him bad dreams and he never tried the Chantix. He was advised that smoking cessation is the best thing for his COPD and that the [facility] will support him however we can to help him quit. He reports he will think about it.

## 2019-01-15 NOTE — Assessment & Plan Note (Signed)
Derek Patterson reports that his back pain has improved. 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 He is not interested in PT at this time.   Plan - Continue conservative therapy

## 2019-01-15 NOTE — Assessment & Plan Note (Addendum)
Derek Patterson is currently using Trelegy. He reports that his symptoms have been much better with this medication. When he is using the Trelegy, he rarely needs to use his albuterol. However, he ran out of the Trelegy recently and noticed worsening of his symptoms. He was given a month-long supply of Trelegy samples and advised to f/u with Dr. Maudie Mercury when he needs samples again. He was also advised to quit smoking to help his pulmonary disease.  Plan - Continue Trelegy - Continue albuterol PRN - F/u in 6 months. More frequent follow-ups with Dr. Maudie Mercury for samples.

## 2019-01-15 NOTE — Progress Notes (Signed)
Patient was seen today in a co-visit, see Dr Dorrell's note for details.

## 2019-01-15 NOTE — Progress Notes (Signed)
Case discussed with Dr. Annie Paras at the time of the visit. We reviewed the resident's history and exam and pertinent patient test results. I agree with the assessment, diagnosis, and plan of care documented in the resident's note.

## 2019-01-15 NOTE — Assessment & Plan Note (Signed)
Derek Patterson weight is stable today at 149 lbs. He reports poor appetite 2/2 dentition. He otherwise has developed no new symptoms. He has not yet gotten insurance or met with the [facility] financial advisor to look into assistance programs. Therefore, he has not scheduled his CT chest for cancer screening yet. He reports that he will look into insurance so that he can do this. He is also due for a colonoscopy. 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   Plan - CT chest when he has insurance - Colonoscopy when he has insurance

## 2019-02-25 ENCOUNTER — Encounter (INDEPENDENT_AMBULATORY_CARE_PROVIDER_SITE_OTHER): Payer: Self-pay

## 2019-02-25 ENCOUNTER — Other Ambulatory Visit: Payer: Self-pay

## 2019-02-25 ENCOUNTER — Encounter: Payer: Self-pay | Admitting: Internal Medicine

## 2019-02-25 ENCOUNTER — Ambulatory Visit (INDEPENDENT_AMBULATORY_CARE_PROVIDER_SITE_OTHER): Payer: Self-pay | Admitting: Internal Medicine

## 2019-02-25 VITALS — BP 128/68 | HR 66 | Temp 98.0°F | Ht 71.0 in | Wt 148.5 lb

## 2019-02-25 DIAGNOSIS — R159 Full incontinence of feces: Secondary | ICD-10-CM

## 2019-02-25 DIAGNOSIS — Z79899 Other long term (current) drug therapy: Secondary | ICD-10-CM

## 2019-02-25 DIAGNOSIS — J441 Chronic obstructive pulmonary disease with (acute) exacerbation: Secondary | ICD-10-CM

## 2019-02-25 MED ORDER — PREDNISONE 20 MG PO TABS
20.0000 mg | ORAL_TABLET | Freq: Every day | ORAL | 0 refills | Status: DC
Start: 2019-02-25 — End: 2019-09-09

## 2019-02-25 MED FILL — predniSONE 20 MG TABS: 20 | 10 days supply | Qty: 10 | Fill #0

## 2019-02-25 NOTE — Patient Instructions (Signed)
Thank you for allowing [place] to provide your care today. Today we discussed your cough and shortness of breath.      Today we made the following changes to your medications:   Please START taking  Prednisone (DELTASONE) 20 MG tablet - take 2 tablets in the morning with breakfast for five days.   Continue taking your alka-seltzer cough as needed for your cough.   I have also given you a spacer to use with your albuterol inhaler.   Please remain out of work until your cough resolves.   Please follow-up if your symptoms worsen or do not improve. If you become severely short of breath or ill please go to the emergency room.   Please follow-up with the clinic once we are at normal operating schedule and once your symptoms improve to assess if you need further increase in your COPD medications.    Should you have any questions or concerns please call the internal medicine clinic at [phone].

## 2019-02-25 NOTE — Assessment & Plan Note (Addendum)
States he has had cough productive and green in color off and on for the past week with increased SOB from normal. He denies fever, chills, nausea, or vomiting but states he has had some bowel incontinence with his cough, which is new. Otherwise denies diarrhea. Appetite decreased, but states he is trying to drink and eat more. He has had recent weight loss. His wife is also ill and was seen in clinic yesterday. Neither have recent travel. He works in [employer]. PE significant for b/l decreased breath sounds but no wheezing. He is satting well on room air. He states he feels like his albuterol inhaler is helping, but albuterol breathing treatments in the past have been even more helpful. He also has been taking alka-seltzer cough which has been helping.  He states his SOB is mildly increase, but at home he has consistent SOB, even when he showers, when he is not sick. PFTs from 2016 shows moderate obstruction with increased TLC and, FEV1 58% and FEV1/FVC of 55% .   - spacer provided with instructions on use  - prednisone 40 MG for five days  - cont. Alka-seltzer cough - f/u six weeks to assess if increase in regular therapy indicated - f/u if symptoms do not improve or worsen over the next several days
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
## 2019-02-26 ENCOUNTER — Telehealth: Payer: Self-pay

## 2019-02-26 NOTE — Telephone Encounter (Signed)
Pt's wife calling back to check on letter-husband needs a work excuse for yesterday. Will follow up with MD.Julitza Rickles Cassady3/18/20201:20 PM

## 2019-02-26 NOTE — Telephone Encounter (Signed)
Will call patient and supply note as he was ill yesterday.

## 2019-02-26 NOTE — Progress Notes (Signed)
Internal Medicine Clinic Attending  Case discussed with Dr. Seawell at the time of the visit.  We reviewed the resident's history and exam and pertinent patient test results.  I agree with the assessment, diagnosis, and plan of care documented in the resident's note.    

## 2019-02-26 NOTE — Telephone Encounter (Signed)
Contacted pt's wife-letter ready for pickup.Despina Hidden Cassady3/18/20201:52 PM

## 2019-02-26 NOTE — Telephone Encounter (Signed)
Pt's wife requesting a note for work. Please call pt back.

## 2019-03-07 ENCOUNTER — Other Ambulatory Visit: Payer: Self-pay | Admitting: Internal Medicine

## 2019-03-07 DIAGNOSIS — J449 Chronic obstructive pulmonary disease, unspecified: Secondary | ICD-10-CM

## 2019-03-10 MED FILL — VENTOLIN HFA 90 MCG INHALER: 108 (90 BAS | 25 days supply | Qty: 18 | Fill #0

## 2019-03-18 ENCOUNTER — Other Ambulatory Visit: Payer: Self-pay | Admitting: Internal Medicine

## 2019-03-18 NOTE — Telephone Encounter (Signed)
Refill Request-Pt rec'd a 60 day supply instead of a 90 day supply and he is almost out.  Pt does not use the [facility] for this medication.  Please send this refill to  [facility] [place] [facility] @ Phone # [phone] and Fax # [phone].   Fluticasone-Umeclidin-Vilant (TRELEGY ELLIPTA) 100-62.5-25 MCG/INH AEPB

## 2019-03-20 ENCOUNTER — Other Ambulatory Visit: Payer: Self-pay | Admitting: Internal Medicine

## 2019-03-20 DIAGNOSIS — J441 Chronic obstructive pulmonary disease with (acute) exacerbation: Secondary | ICD-10-CM

## 2019-03-20 NOTE — Telephone Encounter (Signed)
Spoke with patient and wife. States he has 2 days left of Trelegy. Needs refill to go to [place] [facility]. Fax [phone]. Will send to Dr. Maudie Mercury who has been assisting patient with this. Hubbard Hartshorn, RN, BSN

## 2019-03-20 NOTE — Telephone Encounter (Signed)
Needs refill on Fluticasone-Umeclidin-Vilant (TRELEGY ELLIPTA) 100-62.5-25 MCG/INH AEPB   ;pt contact [phone]   Pls contact pt regarding the pharmacy, pt wife said that she already gave the information previously

## 2019-03-21 MED ORDER — FLUTICASONE-UMECLIDIN-VILANT 100-62.5-25 MCG/INH IN AEPB: 1.0000 | INHALATION_SPRAY | Freq: Every day | RESPIRATORY_TRACT | 11 refills | Status: DC

## 2019-03-25 NOTE — Telephone Encounter (Signed)
I talked to Dr Mannie Stabile - stated she printed Trelegy rx and faxed to [place] pt assistance program last Thursday. Called pt - informed of above and to call their office; stated he will and will call back if he has any problems.

## 2019-03-25 NOTE — Telephone Encounter (Signed)
Pt calling back to f/u with medication. Patient is completely out.  Please call the patient back.

## 2019-03-26 ENCOUNTER — Other Ambulatory Visit: Payer: Self-pay | Admitting: Internal Medicine

## 2019-03-26 ENCOUNTER — Telehealth: Payer: Self-pay

## 2019-03-26 DIAGNOSIS — J441 Chronic obstructive pulmonary disease with (acute) exacerbation: Secondary | ICD-10-CM

## 2019-03-26 MED ORDER — FLUTICASONE-UMECLIDIN-VILANT 100-62.5-25 MCG/INH IN AEPB: 1.0000 | INHALATION_SPRAY | Freq: Every day | RESPIRATORY_TRACT | 11 refills | Status: DC

## 2019-03-26 NOTE — Telephone Encounter (Signed)
I called [place] [facility] - stated they did not a rx for Trelegy.  Dr Lynnae January re-printed medication rx; rx printed and I faxed rx to [place] [facility] @ [phone].  Called pt's wife back - informed rx has been faxed to [place] [facility].

## 2019-03-26 NOTE — Telephone Encounter (Signed)
Thank you Glenda... 

## 2019-03-26 NOTE — Telephone Encounter (Signed)
Pt's wife requesting to speak with a nurse about meds. Please call back.  

## 2019-04-07 MED FILL — VENTOLIN HFA 90 MCG INHALER: 108 (90 BAS | 25 days supply | Qty: 18 | Fill #1

## 2019-04-11 ENCOUNTER — Encounter: Payer: Self-pay | Admitting: Internal Medicine

## 2019-04-14 ENCOUNTER — Telehealth: Payer: Self-pay | Admitting: Internal Medicine

## 2019-04-14 MED ORDER — FLUTICASONE PROPIONATE 50 MCG/ACT NA SUSP: 2.0000 | Freq: Every day | NASAL | 0 refills | Status: DC

## 2019-04-14 NOTE — Telephone Encounter (Signed)
Called Derek Patterson to discuss his message from 5/1 about sinus issues. He reports that for about a week and a half he has experienced painful nasal congestion, runny nose, and itchy eyes. He has tried sudafed and ibuprofen for this with some relief. He has a history of season allergies, but states they haven't been this bad in a few years. He denies fevers (checks his temperature regularly) or changes in dyspnea from his baseline (hx of COPD). I recommended that he take either Claritin or Zyrtec daily. He would prefer to buy this over the counter. I have also recommended Flonase, which I will prescribe. If he is unable to afford the Flonase, we discussed saline rinses. I advised the patient to let me know if his symptoms worsen or if he develops fever or dyspnea. He expressed understanding.

## 2019-04-14 NOTE — Telephone Encounter (Signed)
Will defer to PCP

## 2019-05-26 ENCOUNTER — Other Ambulatory Visit: Payer: Self-pay

## 2019-05-26 ENCOUNTER — Ambulatory Visit (INDEPENDENT_AMBULATORY_CARE_PROVIDER_SITE_OTHER): Payer: Self-pay | Admitting: Internal Medicine

## 2019-05-26 VITALS — BP 125/76 | HR 77 | Temp 97.6°F | Ht 71.0 in | Wt 151.2 lb

## 2019-05-26 DIAGNOSIS — J3489 Other specified disorders of nose and nasal sinuses: Secondary | ICD-10-CM | POA: Insufficient documentation

## 2019-05-26 MED ORDER — AMOXICILLIN-POT CLAVULANATE 875-125 MG PO TABS: 1.0000 | ORAL_TABLET | Freq: Two times a day (BID) | ORAL | 0 refills | Status: AC

## 2019-05-26 MED FILL — AMOX-CLAV 875-125 MG TABLET: 875-125 | 7 days supply | Qty: 14 | Fill #0

## 2019-05-26 NOTE — Assessment & Plan Note (Signed)
Patient has ongoing sinus drainage and pain. He has been taking anithistamine and flonase daily with minmal relief. His exam showes inflammed turninated with L significantly worse than right. No obvious purulence. He is tender in the region of his L nasolabial fold and has tenderness when eating. He is treated for allergic causes and viral causes would be expected to resolve spontaneously. Given this has been ongoing for >1 month and he is self pay; I will provide a course of antibiotics to cover for bacterial infection as he will not be able to have referral for ENT evaluation and and imaging would be expensive as well. - Augmentin 875-125 BID x 7d

## 2019-05-26 NOTE — Patient Instructions (Addendum)
Thank you for allowing [place] to care for you  For your nostril / Sinus pain - Continue current treatments - We have prescribed 7 days of Augmentin (an antibiotic) to treat for possible sinus infection.  Return if symptoms worsen or fail to improve after 2 weeks
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
## 2019-05-27 NOTE — Progress Notes (Signed)
Internal Medicine Clinic Attending  Case discussed with Dr. Melvin  at the time of the visit.  We reviewed the resident's history and exam and pertinent patient test results.  I agree with the assessment, diagnosis, and plan of care documented in the resident's note.  

## 2019-06-01 ENCOUNTER — Encounter: Payer: Self-pay | Admitting: *Deleted

## 2019-06-04 ENCOUNTER — Other Ambulatory Visit: Payer: Self-pay | Admitting: *Deleted

## 2019-06-04 MED ORDER — HYDROCHLOROTHIAZIDE 25 MG PO TABS: 25.0000 mg | ORAL_TABLET | Freq: Every day | ORAL | 3 refills | Status: DC

## 2019-06-16 ENCOUNTER — Other Ambulatory Visit: Payer: Self-pay

## 2019-06-16 ENCOUNTER — Ambulatory Visit: Payer: Self-pay

## 2019-07-03 MED FILL — VENTOLIN HFA 90 MCG INHALER: 108 (90 BAS | 25 days supply | Qty: 18 | Fill #0

## 2019-08-26 MED FILL — VENTOLIN HFA 90 MCG INHALER: 108 (90 BAS | 25 days supply | Qty: 18 | Fill #1

## 2019-09-09 ENCOUNTER — Encounter: Payer: Self-pay | Admitting: Internal Medicine

## 2019-09-09 ENCOUNTER — Other Ambulatory Visit: Payer: Self-pay

## 2019-09-09 ENCOUNTER — Ambulatory Visit (INDEPENDENT_AMBULATORY_CARE_PROVIDER_SITE_OTHER): Payer: Self-pay | Admitting: Internal Medicine

## 2019-09-09 VITALS — BP 132/78 | HR 66 | Temp 97.9°F | Ht 71.0 in | Wt 153.1 lb

## 2019-09-09 DIAGNOSIS — R3916 Straining to void: Secondary | ICD-10-CM

## 2019-09-09 DIAGNOSIS — Z7951 Long term (current) use of inhaled steroids: Secondary | ICD-10-CM

## 2019-09-09 DIAGNOSIS — K635 Polyp of colon: Secondary | ICD-10-CM

## 2019-09-09 DIAGNOSIS — Z23 Encounter for immunization: Secondary | ICD-10-CM

## 2019-09-09 DIAGNOSIS — I10 Essential (primary) hypertension: Secondary | ICD-10-CM

## 2019-09-09 DIAGNOSIS — Z720 Tobacco use: Secondary | ICD-10-CM

## 2019-09-09 DIAGNOSIS — J3489 Other specified disorders of nose and nasal sinuses: Secondary | ICD-10-CM

## 2019-09-09 DIAGNOSIS — Z86010 Personal history of colonic polyps: Secondary | ICD-10-CM

## 2019-09-09 DIAGNOSIS — J449 Chronic obstructive pulmonary disease, unspecified: Secondary | ICD-10-CM

## 2019-09-09 DIAGNOSIS — Z79899 Other long term (current) drug therapy: Secondary | ICD-10-CM

## 2019-09-09 DIAGNOSIS — Z0000 Encounter for general adult medical examination without abnormal findings: Secondary | ICD-10-CM

## 2019-09-09 LAB — POCT GLYCOSYLATED HEMOGLOBIN (HGB A1C): Hemoglobin A1C: 6.1 % — AB (ref 4.0–5.6)

## 2019-09-09 LAB — GLUCOSE, CAPILLARY: Glucose-Capillary: 92 mg/dL (ref 70–99)

## 2019-09-09 NOTE — Assessment & Plan Note (Signed)
Flu vaccine given at this visit. 

## 2019-09-09 NOTE — Assessment & Plan Note (Signed)
Patient has history of COPD for which he is currently using Trelegy. He reports that he experiences dyspnea after taking the Trelegy and requires his Ventolin rescue inhaler for symptomatic relief. We discussed smoking cessation to help with his symptoms. However, patient has also not seen a pulmonologist since initial diagnosis in 2016. Will discuss with Dr. Maudie Mercury regarding the Trelegy and whether we need to increase dosage for symptom control.   - Continue Trelegy daily and albuterol prn - Discuss Trelegy dosing with Dr. Maudie Mercury - Referral to pulmonology at next visit

## 2019-09-09 NOTE — Assessment & Plan Note (Signed)
Patient is due for a colonoscopy as previous colonoscopy showed tubular adenomas and was recommended for colonoscopy in 5 years which was due in 2015. However, due to insurance problems, patient is not able to currently get this done. Will refer to GI for repeat colonoscopy once patient has coverage.
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
## 2019-09-09 NOTE — Assessment & Plan Note (Signed)
Patient continues to take Flomax and is not experiencing any symptoms.  - Continue Flomax

## 2019-09-09 NOTE — Assessment & Plan Note (Signed)
Derek Patterson reports that his sinus symptoms resolved after completion of Augmentin. He continues to use Flonase as needed but reports that he has not needed it over past several weeks.   - Continue to monitor

## 2019-09-09 NOTE — Assessment & Plan Note (Signed)
BP 132/78 at this visit. Patient is on HCTZ 25mg  qd. He denies any headache, dizziness, vision changes, chest pain or shortness of breath. He is tolerating medication well. Advised for smoking cessation.   - Continue HCTZ 25mg  qd

## 2019-09-09 NOTE — Patient Instructions (Addendum)
Derek Patterson,  It was a pleasure seeing you in clinic today. Today, we discussed your COPD and hypertension.  Please continue to take all medications as prescribed. I will discuss with Dr. Maudie Mercury about your Trelegy. For now, continue to take as you have been taking.  You have cut down on your smoking significantly, please continue to cut down as this will help with your COPD.   You received your flu vaccine and we also did blood work at this visit. I will contact you with results.   Please contact us if you have any questions.  Thank you!

## 2019-09-09 NOTE — Assessment & Plan Note (Signed)
Patient states that he smokes 2-3 puffs/day and a full cigarette on stressful days. Discussed that smoking cessation would be best for COPD. He has tried nicotine patches in the past but due to nightmares, discontinued use. Discussed pharmacotherapy options with patient but he wants to try quitting without use of medications first.

## 2019-09-10 LAB — BMP8+ANION GAP
Anion Gap: 17 mmol/L (ref 10.0–18.0)
BUN/Creatinine Ratio: 14 (ref 10–24)
BUN: 16 mg/dL (ref 8–27)
CO2: 22 mmol/L (ref 20–29)
Calcium: 10.2 mg/dL (ref 8.6–10.2)
Chloride: 100 mmol/L (ref 96–106)
Creatinine, Ser: 1.12 mg/dL (ref 0.76–1.27)
GFR calc Af Amer: 81 mL/min/{1.73_m2} (ref >59)
GFR calc non Af Amer: 70 mL/min/{1.73_m2} (ref >59)
Glucose: 86 mg/dL (ref 65–99)
Potassium: 4.8 mmol/L (ref 3.5–5.2)
Sodium: 139 mmol/L (ref 134–144)

## 2019-09-22 NOTE — Progress Notes (Signed)
Internal Medicine Clinic Attending  I saw and evaluated the patient.  I personally confirmed the key portions of the history and exam documented by Dr. Aslam and I reviewed pertinent patient test results.  The assessment, diagnosis, and plan were formulated together and I agree with the documentation in the resident's note.     

## 2019-10-27 ENCOUNTER — Other Ambulatory Visit: Payer: Self-pay | Admitting: Internal Medicine

## 2019-10-27 MED ORDER — TAMSULOSIN HCL 0.4 MG PO CAPS: ORAL_CAPSULE | ORAL | 3 refills | Status: DC

## 2019-10-27 NOTE — Telephone Encounter (Signed)
Needs refill on tamsulosin (FLOMAX) 0.4 MG CAPS capsule  pt contact [facility], [place] - [address]

## 2019-10-29 ENCOUNTER — Other Ambulatory Visit: Payer: Self-pay | Admitting: *Deleted

## 2019-10-29 DIAGNOSIS — J449 Chronic obstructive pulmonary disease, unspecified: Secondary | ICD-10-CM

## 2019-10-29 MED ORDER — ALBUTEROL SULFATE HFA 108 (90 BASE) MCG/ACT IN AERS: 2.0000 | INHALATION_SPRAY | Freq: Four times a day (QID) | RESPIRATORY_TRACT | 1 refills | Status: DC | PRN

## 2019-10-30 ENCOUNTER — Telehealth: Payer: Self-pay | Admitting: Internal Medicine

## 2019-10-30 MED FILL — ALBUTEROL SULFATE HFA 108 (: 108 (90 BAS | 25 days supply | Qty: 18 | Fill #0

## 2019-10-30 NOTE — Telephone Encounter (Signed)
RX for Albuterol (Ventolin HFA) 108 mcg/ACT inhaler transferred to [facility] per pt request and RX was cancelled at [facility]. Higinio Roger, RN,BSN

## 2019-10-30 NOTE — Telephone Encounter (Signed)
Refill Request-Pt's wife states his inhaler medication should be filled at the [facility] instead of [facility], [address]   Please call back.   albuterol (VENTOLIN HFA) 108 (90 Base) MCG/ACT inhaler

## 2019-11-11 ENCOUNTER — Other Ambulatory Visit: Payer: Self-pay

## 2019-11-11 ENCOUNTER — Encounter: Payer: Self-pay | Admitting: Internal Medicine

## 2019-11-11 ENCOUNTER — Ambulatory Visit (INDEPENDENT_AMBULATORY_CARE_PROVIDER_SITE_OTHER): Payer: Self-pay | Admitting: Internal Medicine

## 2019-11-11 VITALS — BP 135/78 | HR 80 | Temp 97.8°F | Ht 71.0 in | Wt 155.0 lb

## 2019-11-11 DIAGNOSIS — R519 Headache, unspecified: Secondary | ICD-10-CM

## 2019-11-11 DIAGNOSIS — I129 Hypertensive chronic kidney disease with stage 1 through stage 4 chronic kidney disease, or unspecified chronic kidney disease: Secondary | ICD-10-CM

## 2019-11-11 DIAGNOSIS — Z791 Long term (current) use of non-steroidal anti-inflammatories (NSAID): Secondary | ICD-10-CM

## 2019-11-11 DIAGNOSIS — J449 Chronic obstructive pulmonary disease, unspecified: Secondary | ICD-10-CM

## 2019-11-11 DIAGNOSIS — I10 Essential (primary) hypertension: Secondary | ICD-10-CM

## 2019-11-11 DIAGNOSIS — N182 Chronic kidney disease, stage 2 (mild): Secondary | ICD-10-CM

## 2019-11-11 DIAGNOSIS — Z720 Tobacco use: Secondary | ICD-10-CM

## 2019-11-11 DIAGNOSIS — Z79899 Other long term (current) drug therapy: Secondary | ICD-10-CM

## 2019-11-11 DIAGNOSIS — J441 Chronic obstructive pulmonary disease with (acute) exacerbation: Secondary | ICD-10-CM

## 2019-11-11 DIAGNOSIS — G44319 Acute post-traumatic headache, not intractable: Secondary | ICD-10-CM

## 2019-11-11 MED ORDER — NAPROXEN 500 MG PO TABS: 500.0000 mg | ORAL_TABLET | Freq: Two times a day (BID) | ORAL | 0 refills | Status: AC

## 2019-11-11 MED FILL — NAPROXEN 500 MG TABLET: 500 | 30 days supply | Qty: 60 | Fill #0
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
## 2019-11-11 NOTE — Patient Instructions (Signed)
Derek Patterson,   It was a pleasure seeing you in clinic today. You were seen for your headache for the past month. Your physical exam was reassuring. However, given your age and aspirin use, I would like to rule out any bleeding. You will be contacted to schedule a CT scan within one week.   For your COPD, I will place a referral to the lung specialist for you. Please schedule an appointment at your earliest convenience. We discussed smoking cessation as well, please let us know if we can help in any way.    Thank you!

## 2019-11-12 DIAGNOSIS — R519 Headache, unspecified: Secondary | ICD-10-CM | POA: Insufficient documentation

## 2019-11-12 LAB — BMP8+ANION GAP
Anion Gap: 19 mmol/L — ABNORMAL HIGH (ref 10.0–18.0)
BUN/Creatinine Ratio: 14 (ref 10–24)
BUN: 17 mg/dL (ref 8–27)
CO2: 22 mmol/L (ref 20–29)
Calcium: 9.8 mg/dL (ref 8.6–10.2)
Chloride: 100 mmol/L (ref 96–106)
Creatinine, Ser: 1.22 mg/dL (ref 0.76–1.27)
GFR calc Af Amer: 73 mL/min/{1.73_m2} (ref >59)
GFR calc non Af Amer: 63 mL/min/{1.73_m2} (ref >59)
Glucose: 94 mg/dL (ref 65–99)
Potassium: 4.3 mmol/L (ref 3.5–5.2)
Sodium: 141 mmol/L (ref 134–144)

## 2019-11-12 NOTE — Assessment & Plan Note (Signed)
Mr. Copus presents with frontal headaches following head trauma one month ago. He reports that he was reaching to put a box on the top shelf when the box fell over and the metal contents inside the box fell on his head. No loss of consciousness. He reports that he noted bruising in the area that resolved over the next 3-4 days. He has been experiencing headaches since the trauma. He notes them to be along the length of his forehead. No associated with lightheadedness/dizziness, vision changes, sensory changes or focal neurologic deficits. 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 On examination, head is atraumatic and nontender. CN II-XII intact. No focal neurologic deficits or gait abnormalities noted. However, as the headaches are unrelieved with ibuprofen and given his age and he is aspirin therapy, will rule out any intracranial bleed with CT Head.   - CT Head w/o Contrast  - Naproxen 500mg  bid prn

## 2019-11-12 NOTE — Assessment & Plan Note (Signed)
Patient with history of COPD on Trelegy. Previously had dyspnea after use of Trelegy requiring rescue inhaler. He reports that this has improved. However, he still requires his rescue inhaler for any activities. He reports that if he is active, he will use his inhaler 4-5 times per day. He still continues to smoke but notes that its just "a few cigarettes" per day. We discussed smoking cessation to help with symptoms and he notes that he is trying to quit. Offered support and medications but patient does not want any medications at this time. Discussed with patient that he would need to visit pulmonologist. Patient is agreeable.  - Continue Trelegy daily and albuterol prn - Referral to pulmonology placed

## 2019-11-12 NOTE — Assessment & Plan Note (Signed)
Given increased ibuprofen use in setting of CKD, will obtain BMP.  sCr 1.2 and GFR>60, similar to baseline.   - Continue to monitor

## 2019-11-12 NOTE — Progress Notes (Signed)
Internal Medicine Clinic Attending  I saw and evaluated the patient.  I personally confirmed the key portions of the history and exam documented by Dr. Marva Panda and I reviewed pertinent patient test results.  The assessment, diagnosis, and plan were formulated together and I agree with the documentation in the resident's note.    Patient here with 2 months of persistent headache after head trauma to the head. He did not seek medical care at the time. He has been taking ibuprofen with only partial relief. This could possibly be a post concussive syndrome, but given his age and increased risk for bleeding on aspirin, agree with obtaining imaging.

## 2019-11-13 ENCOUNTER — Telehealth: Payer: Self-pay | Admitting: Internal Medicine

## 2019-11-13 NOTE — Telephone Encounter (Signed)
Attempted to rtc to pt, no answer, lm for rtc

## 2019-11-13 NOTE — Telephone Encounter (Signed)
Patient called back. States he received a call after 5 yesterday so may be PCP with lab results. Will forward to PCP to return call. Hubbard Hartshorn, BSN, RN-BC

## 2019-11-13 NOTE — Telephone Encounter (Signed)
Called patient back. No answer. Left message with lab results on self-identified voicemail. Also advised patient that his call yesterday was probably to schedule his CT Head and to call them back at earliest convenience.

## 2019-11-13 NOTE — Telephone Encounter (Signed)
Pt missed a call yesterday, pls contact [phone]

## 2019-11-18 ENCOUNTER — Telehealth: Payer: Self-pay | Admitting: Internal Medicine

## 2019-11-18 NOTE — Telephone Encounter (Signed)
Pt is requesting nurse to call back [phone]

## 2019-11-18 NOTE — Telephone Encounter (Signed)
Pt calling about CT schedule, transferred to [facility].

## 2019-11-19 NOTE — Telephone Encounter (Signed)
Patient has been notified of his appointment with [facility] on 11/25/2019 @ 3:30 to arrive @ 3:15pm.

## 2019-11-25 ENCOUNTER — Other Ambulatory Visit: Payer: Self-pay

## 2019-11-25 ENCOUNTER — Ambulatory Visit (HOSPITAL_COMMUNITY)
Admission: RE | Admit: 2019-11-25 | Discharge: 2019-11-25 | Disposition: A | Discharge status: Home / Self Care | Payer: Self-pay | Source: Ambulatory Visit | Attending: Internal Medicine | Admitting: Internal Medicine

## 2019-11-25 DIAGNOSIS — G44319 Acute post-traumatic headache, not intractable: Secondary | ICD-10-CM | POA: Insufficient documentation

## 2019-11-26 ENCOUNTER — Telehealth: Payer: Self-pay | Admitting: *Deleted

## 2019-11-26 ENCOUNTER — Encounter: Payer: Self-pay | Admitting: Internal Medicine

## 2019-11-26 DIAGNOSIS — I671 Cerebral aneurysm, nonruptured: Secondary | ICD-10-CM

## 2019-11-26 NOTE — Telephone Encounter (Signed)
Can someone help ensure this CT angiogram is scheduled within the next few days? I put it in as ASAP priority. Patient is uninsured. Last CT order took 15 days to complete, which is a little too long for this follow up order.

## 2019-11-26 NOTE — Telephone Encounter (Signed)
Results of CT head, reported by radiology, please take note

## 2019-11-26 NOTE — Telephone Encounter (Signed)
I reviewed the results of the CT head with Derek Patterson. Patient was seen in clinic on 12/1 by Dr. Marva Panda for new post-traumatic headaches and had a CT ordered to rule out subdural hematoma. No hematoma found, but incidentally there was a focal vascular enlargement in the midbrain that could be consistent with a focal aneurysm. Radiology is advising a follow up CT angiography to better characterize. The patient is feeling well this morning. He still has occasional self limited headaches, almost on a daily basis, but not function limiting right now. He is agreeable to further testing and understands that this finding needs follow up and that if it is an aneurysm may need further interventions to treat it. All questions answered.

## 2019-11-26 NOTE — Telephone Encounter (Signed)
Patient has been notified of his CT Appointment for 11/28/2019 for 9 am. Pt has been given instructions and asked to arrive 15 minutes early @ [facility].  Patient verbalized understanding and will call back if he is unable to attend.

## 2019-11-28 ENCOUNTER — Telehealth: Payer: Self-pay | Admitting: Student in an Organized Health Care Education/Training Program

## 2019-11-28 ENCOUNTER — Ambulatory Visit (HOSPITAL_COMMUNITY)
Admission: RE | Admit: 2019-11-28 | Discharge: 2019-11-28 | Disposition: A | Discharge status: Home / Self Care | Payer: Self-pay | Source: Ambulatory Visit | Attending: Student in an Organized Health Care Education/Training Program | Admitting: Student in an Organized Health Care Education/Training Program

## 2019-11-28 ENCOUNTER — Other Ambulatory Visit: Payer: Self-pay

## 2019-11-28 ENCOUNTER — Telehealth: Payer: Self-pay | Admitting: *Deleted

## 2019-11-28 ENCOUNTER — Other Ambulatory Visit: Payer: Self-pay | Admitting: Student in an Organized Health Care Education/Training Program

## 2019-11-28 DIAGNOSIS — Q282 Arteriovenous malformation of cerebral vessels: Secondary | ICD-10-CM

## 2019-11-28 DIAGNOSIS — I671 Cerebral aneurysm, nonruptured: Secondary | ICD-10-CM | POA: Insufficient documentation

## 2019-11-28 MED ORDER — IOHEXOL 350 MG/ML SOLN
100.0000 mL | Freq: Once | INTRAVENOUS | Status: AC | PRN
  Administered 2019-11-28: 10:00:00 100 mL via INTRAVENOUS

## 2019-11-28 NOTE — Telephone Encounter (Signed)
Yes, noted, I have left a phone note with referral instructions and I think I forwarded that to the right person.

## 2019-11-28 NOTE — Telephone Encounter (Signed)
[facility] radiology calls to make sure CT of head is noted by md, sending to attending and pcp, ct done today

## 2019-11-28 NOTE — Telephone Encounter (Signed)
CT angiogram of the head does not show an aneurysm, but does show dilated veins at the mid brain which could be an AV malformation. It is unclear to me if it is related to his ongoing headaches, or if it is an incidental finding, or if it poses risk to him in the future. Radiology report recommends consultation with Neuro-Interventional Radiology, which I will order. Their office number is [phone].

## 2019-12-01 ENCOUNTER — Other Ambulatory Visit (HOSPITAL_COMMUNITY): Payer: Self-pay | Admitting: Interventional Radiology

## 2019-12-01 DIAGNOSIS — Q282 Arteriovenous malformation of cerebral vessels: Secondary | ICD-10-CM

## 2019-12-02 ENCOUNTER — Other Ambulatory Visit: Payer: Self-pay

## 2019-12-02 ENCOUNTER — Encounter: Payer: Self-pay | Admitting: Pulmonary Disease

## 2019-12-02 ENCOUNTER — Ambulatory Visit (INDEPENDENT_AMBULATORY_CARE_PROVIDER_SITE_OTHER): Payer: Self-pay

## 2019-12-02 ENCOUNTER — Ambulatory Visit (INDEPENDENT_AMBULATORY_CARE_PROVIDER_SITE_OTHER): Payer: Self-pay | Admitting: Pulmonary Disease

## 2019-12-02 VITALS — BP 120/78 | HR 68 | Temp 98.0°F | Ht 71.0 in | Wt 159.0 lb

## 2019-12-02 DIAGNOSIS — J449 Chronic obstructive pulmonary disease, unspecified: Secondary | ICD-10-CM

## 2019-12-02 DIAGNOSIS — R0602 Shortness of breath: Secondary | ICD-10-CM

## 2019-12-02 NOTE — Patient Instructions (Signed)
COPD  Obtain PFT Chest x-ray Echocardiogram for shortness of breath  Continue using Trelegy and albuterol  I will see you back in the office in about 4 to 6 weeks  Call with significant concerns
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
## 2019-12-03 ENCOUNTER — Telehealth: Payer: Self-pay | Admitting: Pulmonary Disease

## 2019-12-03 ENCOUNTER — Other Ambulatory Visit: Payer: Self-pay | Admitting: *Deleted

## 2019-12-03 MED ORDER — HYDROCHLOROTHIAZIDE 25 MG PO TABS: 25.0000 mg | ORAL_TABLET | Freq: Every day | ORAL | 3 refills | Status: DC

## 2019-12-03 MED FILL — ALBUTEROL SULFATE HFA 108 (: 108 (90 BAS | 17 days supply | Qty: 9 | Fill #0

## 2019-12-04 ENCOUNTER — Ambulatory Visit (HOSPITAL_COMMUNITY)
Admission: RE | Admit: 2019-12-04 | Discharge: 2019-12-04 | Disposition: A | Discharge status: Home / Self Care | Payer: Self-pay | Source: Ambulatory Visit | Attending: Interventional Radiology | Admitting: Interventional Radiology

## 2019-12-04 ENCOUNTER — Other Ambulatory Visit: Payer: Self-pay

## 2019-12-04 DIAGNOSIS — Q282 Arteriovenous malformation of cerebral vessels: Secondary | ICD-10-CM
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
## 2019-12-08 ENCOUNTER — Other Ambulatory Visit (HOSPITAL_COMMUNITY): Payer: Self-pay | Admitting: Interventional Radiology

## 2019-12-11 NOTE — Telephone Encounter (Signed)
Error

## 2019-12-22 ENCOUNTER — Other Ambulatory Visit (HOSPITAL_COMMUNITY): Payer: Self-pay

## 2019-12-31 ENCOUNTER — Ambulatory Visit (INDEPENDENT_AMBULATORY_CARE_PROVIDER_SITE_OTHER): Payer: 59 | Admitting: Pulmonary Disease

## 2019-12-31 ENCOUNTER — Other Ambulatory Visit: Payer: Self-pay

## 2019-12-31 ENCOUNTER — Encounter: Payer: Self-pay | Admitting: Pulmonary Disease

## 2019-12-31 VITALS — BP 122/68 | HR 82 | Temp 98.6°F | Ht 71.0 in | Wt 158.6 lb

## 2019-12-31 DIAGNOSIS — J449 Chronic obstructive pulmonary disease, unspecified: Secondary | ICD-10-CM | POA: Diagnosis not present

## 2019-12-31 DIAGNOSIS — R0602 Shortness of breath: Secondary | ICD-10-CM | POA: Diagnosis not present

## 2019-12-31 MED ORDER — TRELEGY ELLIPTA 200-62.5-25 MCG/INH IN AEPB: 1.0000 | INHALATION_SPRAY | Freq: Every day | RESPIRATORY_TRACT | 0 refills | Status: DC

## 2019-12-31 MED ORDER — TRELEGY ELLIPTA 200-62.5-25 MCG/INH IN AEPB: 1.0000 | INHALATION_SPRAY | Freq: Every day | RESPIRATORY_TRACT | 4 refills | Status: DC
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
## 2019-12-31 NOTE — Addendum Note (Signed)
Addended by: Nena Polio on: 12/31/2019 02:34 PM   Modules accepted: Orders

## 2019-12-31 NOTE — Patient Instructions (Addendum)
Obstructive lung disease  Follow-up with your PFT schedule-breathing study  We will provide you with a sample of Trelegy 200-1 puff daily Prescription will be sent to pharmacy  Stay active stay activeStay active Call with any significant concerns

## 2020-01-01 ENCOUNTER — Ambulatory Visit (HOSPITAL_COMMUNITY): Payer: 59 | Attending: Internal Medicine

## 2020-01-01 DIAGNOSIS — R0602 Shortness of breath: Secondary | ICD-10-CM | POA: Insufficient documentation

## 2020-01-19 ENCOUNTER — Other Ambulatory Visit (HOSPITAL_COMMUNITY)
Admission: RE | Admit: 2020-01-19 | Discharge: 2020-01-19 | Disposition: A | Discharge status: Home / Self Care | Payer: 59 | Source: Ambulatory Visit | Attending: Pulmonary Disease | Admitting: Pulmonary Disease

## 2020-01-19 DIAGNOSIS — Z01812 Encounter for preprocedural laboratory examination: Secondary | ICD-10-CM | POA: Insufficient documentation

## 2020-01-19 DIAGNOSIS — Z20822 Contact with and (suspected) exposure to covid-19: Secondary | ICD-10-CM | POA: Diagnosis not present

## 2020-01-19 LAB — SARS CORONAVIRUS 2 (TAT 6-24 HRS): SARS Coronavirus 2: NEGATIVE

## 2020-01-22 ENCOUNTER — Other Ambulatory Visit: Payer: Self-pay

## 2020-01-22 ENCOUNTER — Ambulatory Visit (INDEPENDENT_AMBULATORY_CARE_PROVIDER_SITE_OTHER): Payer: 59 | Admitting: Pulmonary Disease

## 2020-01-22 DIAGNOSIS — R0602 Shortness of breath: Secondary | ICD-10-CM

## 2020-01-22 DIAGNOSIS — J449 Chronic obstructive pulmonary disease, unspecified: Secondary | ICD-10-CM

## 2020-01-22 LAB — PULMONARY FUNCTION TEST
DL/VA % pred: 55 %
DL/VA: 2.31 ml/min/mmHg/L
DLCO unc % pred: 39 %
DLCO unc: 11 ml/min/mmHg
FEF 25-75 Post: 0.88 L/sec
FEF 25-75 Pre: 0.51 L/sec
FEF2575-%Change-Post: 73 %
FEF2575-%Pred-Post: 30 %
FEF2575-%Pred-Pre: 17 %
FEV1-%Change-Post: 22 %
FEV1-%Pred-Post: 48 %
FEV1-%Pred-Pre: 39 %
FEV1-Post: 1.56 L
FEV1-Pre: 1.28 L
FEV1FVC-%Change-Post: 2 %
FEV1FVC-%Pred-Pre: 62 %
FEV6-%Change-Post: 19 %
FEV6-%Pred-Post: 74 %
FEV6-%Pred-Pre: 62 %
FEV6-Post: 2.98 L
FEV6-Pre: 2.5 L
FEV6FVC-%Change-Post: 0 %
FEV6FVC-%Pred-Post: 99 %
FEV6FVC-%Pred-Pre: 99 %
FVC-%Change-Post: 19 %
FVC-%Pred-Post: 75 %
FVC-%Pred-Pre: 63 %
FVC-Post: 3.12 L
FVC-Pre: 2.62 L
Post FEV1/FVC ratio: 50 %
Post FEV6/FVC ratio: 95 %
Pre FEV1/FVC ratio: 49 %
Pre FEV6/FVC Ratio: 95 %
RV % pred: 145 %
RV: 3.41 L
TLC % pred: 89 %
TLC: 6.48 L

## 2020-01-22 NOTE — Progress Notes (Signed)
Full PFT performed today. °

## 2020-03-24 ENCOUNTER — Other Ambulatory Visit: Payer: Self-pay | Admitting: *Deleted

## 2020-03-24 ENCOUNTER — Other Ambulatory Visit: Payer: Self-pay | Admitting: Internal Medicine

## 2020-03-24 DIAGNOSIS — J449 Chronic obstructive pulmonary disease, unspecified: Secondary | ICD-10-CM

## 2020-03-24 MED ORDER — ALBUTEROL SULFATE HFA 108 (90 BASE) MCG/ACT IN AERS: 1.0000 | INHALATION_SPRAY | RESPIRATORY_TRACT | 0 refills | Status: DC | PRN

## 2020-03-24 NOTE — Telephone Encounter (Signed)
Call from [facility], [place] - stated received rx for Albuterol inh which will cost $51.00. Please re-send rx with "IM Program" if appropriate. Thanks

## 2020-04-26 ENCOUNTER — Other Ambulatory Visit: Payer: Self-pay | Admitting: Internal Medicine

## 2020-04-26 DIAGNOSIS — J449 Chronic obstructive pulmonary disease, unspecified: Secondary | ICD-10-CM

## 2020-05-04 ENCOUNTER — Other Ambulatory Visit: Payer: Self-pay | Admitting: *Deleted

## 2020-05-04 MED ORDER — HYDROCHLOROTHIAZIDE 25 MG PO TABS: 25.0000 mg | ORAL_TABLET | Freq: Every day | ORAL | 1 refills | Status: DC

## 2020-05-19 ENCOUNTER — Other Ambulatory Visit: Payer: Self-pay | Admitting: Internal Medicine

## 2020-05-19 DIAGNOSIS — J449 Chronic obstructive pulmonary disease, unspecified: Secondary | ICD-10-CM

## 2020-05-19 NOTE — Telephone Encounter (Signed)
Last visit 11/11/2019 w/pcp No future appts scheduled Will send request to pcp for review. Please advise.Marland Kitchen  Marland KitchenFront Desk: Please schedule next avail appt with pcp

## 2020-05-19 NOTE — Telephone Encounter (Signed)
Last refill 3 weeks ago. Patient was last seen by pulmonology 12/2019 and recommended for PFTs. Patient should schedule for this and should also be scheduled for appt in clinic.

## 2020-05-21 ENCOUNTER — Other Ambulatory Visit: Payer: Self-pay | Admitting: Internal Medicine

## 2020-05-21 DIAGNOSIS — J449 Chronic obstructive pulmonary disease, unspecified: Secondary | ICD-10-CM

## 2020-05-21 NOTE — Telephone Encounter (Signed)
Please call pt back about albuterol (VENTOLIN HFA) 108 (90 Base) MCG/ACT inhaler.

## 2020-05-24 NOTE — Telephone Encounter (Signed)
An Appt has been sch for 06/07/2020.

## 2020-06-07 ENCOUNTER — Encounter: Payer: Self-pay | Admitting: Internal Medicine

## 2020-06-07 ENCOUNTER — Other Ambulatory Visit: Payer: Self-pay

## 2020-06-07 ENCOUNTER — Ambulatory Visit (INDEPENDENT_AMBULATORY_CARE_PROVIDER_SITE_OTHER): Payer: 59 | Admitting: Internal Medicine

## 2020-06-07 VITALS — BP 124/76 | HR 82 | Temp 98.0°F | Ht 71.0 in | Wt 152.9 lb

## 2020-06-07 DIAGNOSIS — K635 Polyp of colon: Secondary | ICD-10-CM | POA: Diagnosis not present

## 2020-06-07 DIAGNOSIS — R7303 Prediabetes: Secondary | ICD-10-CM | POA: Diagnosis not present

## 2020-06-07 DIAGNOSIS — N182 Chronic kidney disease, stage 2 (mild): Secondary | ICD-10-CM

## 2020-06-07 DIAGNOSIS — J449 Chronic obstructive pulmonary disease, unspecified: Secondary | ICD-10-CM | POA: Diagnosis not present

## 2020-06-07 LAB — POCT GLYCOSYLATED HEMOGLOBIN (HGB A1C): Hemoglobin A1C: 6 % — AB (ref 4.0–5.6)

## 2020-06-07 LAB — GLUCOSE, CAPILLARY: Glucose-Capillary: 103 mg/dL — ABNORMAL HIGH (ref 70–99)

## 2020-06-07 NOTE — Progress Notes (Signed)
Prior to ambulation, at rest - O2 sat - 98%; P - 84. Ambulating - O2 sat 94 - 95 %; P - 102.

## 2020-06-07 NOTE — Progress Notes (Deleted)
Prior to ambulation, at rest - O2 sat - 98%; P - 84. Ambulating - O2 sat 94 - 95 %; P - 102.

## 2020-06-07 NOTE — Patient Instructions (Addendum)
[name],  It was a pleasure seeing you in clinic. Today we discussed:   COPD: Please continue to take your Trelegy as prescribed and use your rescue inhaler as needed. I would recommend for complete smoking cessation to prevent any further progression. Please follow up with the pulmonologist for further recommendations.  I will also check some blood work today. I will call you if there are any abnormalities. I will also place a referral to GI to set you up for a colonoscopy.  If you have any questions or concerns, please call our clinic at [phone] between 9am-5pm and after hours call [phone] and ask for the internal medicine resident on call. If you feel you are having a medical emergency please call 911.   Thank you, we look forward to helping you remain healthy!  If you have not already done so, I would recommend getting the COVID 19 vaccine.  To schedule an appointment for a COVID vaccine or be added to the vaccine wait list: Go to [email or web address]   OR Go to [email or web address]                  OR Call [phone]                                     OR Call [phone] and select Option 2

## 2020-06-08 LAB — BMP8+ANION GAP
Anion Gap: 13 mmol/L (ref 10.0–18.0)
BUN/Creatinine Ratio: 12 (ref 10–24)
BUN: 15 mg/dL (ref 8–27)
CO2: 26 mmol/L (ref 20–29)
Calcium: 10.1 mg/dL (ref 8.6–10.2)
Chloride: 99 mmol/L (ref 96–106)
Creatinine, Ser: 1.29 mg/dL — ABNORMAL HIGH (ref 0.76–1.27)
GFR calc Af Amer: 68 mL/min/{1.73_m2} (ref >59)
GFR calc non Af Amer: 59 mL/min/{1.73_m2} — ABNORMAL LOW (ref >59)
Glucose: 91 mg/dL (ref 65–99)
Potassium: 4.8 mmol/L (ref 3.5–5.2)
Sodium: 138 mmol/L (ref 134–144)

## 2020-06-08 LAB — CBC
Hematocrit: 49.6 % (ref 37.5–51.0)
Hemoglobin: 17 g/dL (ref 13.0–17.7)
MCH: 27.8 pg (ref 26.6–33.0)
MCHC: 34.3 g/dL (ref 31.5–35.7)
MCV: 81 fL (ref 79–97)
Platelets: 298 10*3/uL (ref 150–450)
RBC: 6.12 x10E6/uL — ABNORMAL HIGH (ref 4.14–5.80)
RDW: 14.1 % (ref 11.6–15.4)
WBC: 5.5 10*3/uL (ref 3.4–10.8)

## 2020-06-08 NOTE — Assessment & Plan Note (Signed)
Patient is on Trelegy and albuterol inhaler. He was evaluated by pulmonology and PFTs show severe COPD. He notes ongoing use of his rescue inhaler with any activity and can use his inhaler 4-5 times per day.  Patient continues to smoke 2-3 cigarettes per day. Advised patient for smoking cessation. Patient ambulated in clinic with pulse ox. No oxygen requirements at this time. Patient recommended to continue Trelegy and albuterol prn at this time. Recommended to follow up with pulmonology.
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
## 2020-06-08 NOTE — Assessment & Plan Note (Signed)
sCr continues to be at baseline.   Continue to monitor

## 2020-06-08 NOTE — Assessment & Plan Note (Signed)
Patient had tubular adenoma on colonoscopy in 2010 and recommended for follow up colonoscopy in 5 years. Patient has not been able to get this done due to insurance issues. - Referral to GI for repeat colonoscopy

## 2020-06-09 NOTE — Progress Notes (Signed)
Internal Medicine Clinic Attending  Case discussed with Dr. Aslam at the time of the visit.  We reviewed the resident's history and exam and pertinent patient test results.  I agree with the assessment, diagnosis, and plan of care documented in the resident's note.  

## 2020-06-25 ENCOUNTER — Other Ambulatory Visit: Payer: Self-pay | Admitting: Internal Medicine

## 2020-06-25 ENCOUNTER — Encounter: Payer: Self-pay | Admitting: Gastroenterology

## 2020-06-25 ENCOUNTER — Telehealth: Payer: Self-pay

## 2020-06-25 DIAGNOSIS — R0602 Shortness of breath: Secondary | ICD-10-CM

## 2020-06-25 DIAGNOSIS — J449 Chronic obstructive pulmonary disease, unspecified: Secondary | ICD-10-CM

## 2020-06-25 MED ORDER — TRELEGY ELLIPTA 200-62.5-25 MCG/INH IN AEPB: 1.0000 | INHALATION_SPRAY | Freq: Every day | RESPIRATORY_TRACT | 4 refills | Status: DC

## 2020-06-25 NOTE — Telephone Encounter (Signed)
Need refill on Fluticasone-Umeclidin-Vilant [facility] ELLIPTA) 200-62.5-25 MCG/INH AEPB ;pt contact [place], [place] - [address]

## 2020-06-25 NOTE — Telephone Encounter (Signed)
Received TC from patient's wife, Alden Server.  She states refill was requested earlier today for her husband's Trelegy and that her husband receives this from the [place] [facility].  She is requesting the RX be faxed to [place] [facility] Program at [phone].  If RX approved by MD, please print and sign RX and give to triage nurse to fax for you. Thanks! SChaplin, RN,BSN

## 2020-06-25 NOTE — Telephone Encounter (Signed)
Trelogy RX faxed to [place] [facility] Assistance Program per patient's wife request, confirmation fax received. SChaplin, RN,BSN

## 2020-06-28 MED ORDER — TRELEGY ELLIPTA 200-62.5-25 MCG/INH IN AEPB: 1.0000 | INHALATION_SPRAY | Freq: Every day | RESPIRATORY_TRACT | 0 refills | Status: DC

## 2020-06-28 MED ORDER — TRELEGY ELLIPTA 200-62.5-25 MCG/INH IN AEPB: 1.0000 | INHALATION_SPRAY | Freq: Every day | RESPIRATORY_TRACT | 2 refills | Status: DC

## 2020-06-28 NOTE — Telephone Encounter (Signed)
Rx sent to [facility]; however, patient does have insurance and does not qualify for IM Program.

## 2020-06-28 NOTE — Addendum Note (Signed)
Addended by: Ebbie Latus on: 06/28/2020 03:09 PM   Modules accepted: Orders

## 2020-06-28 NOTE — Addendum Note (Signed)
Addended by: Harvie Heck on: 06/28/2020 03:12 PM   Modules accepted: Orders

## 2020-06-28 NOTE — Addendum Note (Signed)
Addended by: Harvie Heck on: 06/28/2020 04:02 PM   Modules accepted: Orders

## 2020-06-28 NOTE — Telephone Encounter (Signed)
Rx for Trelegy printed and signed to be faxed to [facility].

## 2020-06-28 NOTE — Telephone Encounter (Signed)
Call from pt's wife - stated the patient assistance program did not received a rx for Trelegy. Stated to send new rx to a local pharmcy and pt will be able to get it for free. Send rx to [facility].

## 2020-06-28 NOTE — Telephone Encounter (Addendum)
Patient's wife called back stating Trelegy Ellipta is $1100 at [facility]. Requesting Rx be faxed to [place] program at [phone]. States patient has already been approved by [facility], just needs new Rx. This has been done with Fax confirmation receipt received. Wife notified and is very Patent attorney. Hubbard Hartshorn, BSN, RN-BC

## 2020-08-02 ENCOUNTER — Ambulatory Visit (AMBULATORY_SURGERY_CENTER): Payer: Self-pay

## 2020-08-02 ENCOUNTER — Other Ambulatory Visit: Payer: Self-pay

## 2020-08-02 VITALS — Ht 71.0 in | Wt 152.8 lb

## 2020-08-02 DIAGNOSIS — Z86010 Personal history of colonic polyps: Secondary | ICD-10-CM

## 2020-08-02 NOTE — Progress Notes (Signed)
No egg or soy allergy known to patient  No issues with past sedation with any surgeries or procedures no intubation problems in the past  No FH of Malignant Hyperthermia No diet pills per patient No home 02 use per patient  No blood thinners per patient  Pt denies issues with constipation  No A fib or A flutter  EMMI video to pt or via [place] 19 guidelines implemented in PV today with Pt and RN   PT HAS BEEN VACCINATED FOR COVID  Due to the COVID-19 pandemic we are asking patients to follow these guidelines. Please only bring one care partner. Please be aware that your care partner may wait in the car in the parking lot or if they feel like they will be too hot to wait in the car, they may wait in the lobby on the 4th floor. All care partners are required to wear a mask the entire time (we do not have any that we can provide them), they need to practice social distancing, and we will do a Covid check for all patient's and care partners when you arrive. Also we will check their temperature and your temperature. If the care partner waits in their car they need to stay in the parking lot the entire time and we will call them on their cell phone when the patient is ready for discharge so they can bring the car to the front of the building. Also all patient's will need to wear a mask into building.

## 2020-08-17 ENCOUNTER — Other Ambulatory Visit: Payer: Self-pay | Admitting: Internal Medicine

## 2020-08-24 ENCOUNTER — Other Ambulatory Visit: Payer: Self-pay | Admitting: Internal Medicine

## 2020-08-25 ENCOUNTER — Other Ambulatory Visit: Payer: Self-pay | Admitting: Internal Medicine

## 2020-08-25 DIAGNOSIS — J449 Chronic obstructive pulmonary disease, unspecified: Secondary | ICD-10-CM

## 2020-08-26 ENCOUNTER — Encounter: Payer: Self-pay | Admitting: Gastroenterology

## 2020-08-26 ENCOUNTER — Other Ambulatory Visit: Payer: Self-pay

## 2020-08-26 ENCOUNTER — Ambulatory Visit (AMBULATORY_SURGERY_CENTER): Payer: 59 | Admitting: Gastroenterology

## 2020-08-26 VITALS — BP 118/73 | HR 72 | Temp 97.1°F | Resp 19 | Ht 71.0 in | Wt 152.8 lb

## 2020-08-26 DIAGNOSIS — D128 Benign neoplasm of rectum: Secondary | ICD-10-CM

## 2020-08-26 DIAGNOSIS — D127 Benign neoplasm of rectosigmoid junction: Secondary | ICD-10-CM

## 2020-08-26 DIAGNOSIS — Z86010 Personal history of colonic polyps: Secondary | ICD-10-CM | POA: Diagnosis not present

## 2020-08-26 DIAGNOSIS — D129 Benign neoplasm of anus and anal canal: Secondary | ICD-10-CM

## 2020-08-26 MED ORDER — SODIUM CHLORIDE 0.9 % IV SOLN: 500.0000 mL | Freq: Once | INTRAVENOUS | Status: DC

## 2020-08-26 NOTE — Patient Instructions (Signed)
Impression/Recommendations:  Hemorrhoid handout given to patient. Polyp handout given to patient. High fiber diet handout given to patient.  Use FIberCon 1-2 tablets by mouth daily. Continue present medications. Await pathology results.  Repeat colonoscopy in 7 years for surveillance.  YOU HAD AN ENDOSCOPIC PROCEDURE TODAY AT [facility]:   Refer to the procedure report that was given to you for any specific questions about what was found during the examination.  If the procedure report does not answer your questions, please call your gastroenterologist to clarify.  If you requested that your care partner not be given the details of your procedure findings, then the procedure report has been included in a sealed envelope for you to review at your convenience later.  YOU SHOULD EXPECT: Some feelings of bloating in the abdomen. Passage of more gas than usual.  Walking can help get rid of the air that was put into your GI tract during the procedure and reduce the bloating. If you had a lower endoscopy (such as a colonoscopy or flexible sigmoidoscopy) you may notice spotting of blood in your stool or on the toilet paper. If you underwent a bowel prep for your procedure, you may not have a normal bowel movement for a few days.  Please Note:  You might notice some irritation and congestion in your nose or some drainage.  This is from the oxygen used during your procedure.  There is no need for concern and it should clear up in a day or so.  SYMPTOMS TO REPORT IMMEDIATELY:   Following lower endoscopy (colonoscopy or flexible sigmoidoscopy):  Excessive amounts of blood in the stool  Significant tenderness or worsening of abdominal pains  Swelling of the abdomen that is new, acute  Fever of 100F or higher  For urgent or emergent issues, a gastroenterologist can be reached at any hour by calling [phone]. Do not use MyChart messaging for urgent concerns.    DIET:  We do  recommend a small meal at first, but then you may proceed to your regular diet.  Drink plenty of fluids but you should avoid alcoholic beverages for 24 hours.  ACTIVITY:  You should plan to take it easy for the rest of today and you should NOT DRIVE or use heavy machinery until tomorrow (because of the sedation medicines used during the test).    FOLLOW UP: Our staff will call the number listed on your records 48-72 hours following your procedure to check on you and address any questions or concerns that you may have regarding the information given to you following your procedure. If we do not reach you, we will leave a message.  We will attempt to reach you two times.  During this call, we will ask if you have developed any symptoms of COVID 19. If you develop any symptoms (ie: fever, flu-like symptoms, shortness of breath, cough etc.) before then, please call [phone].  If you test positive for Covid 19 in the 2 weeks post procedure, please call and report this information to [place].    If any biopsies were taken you will be contacted by phone or by letter within the next 1-3 weeks.  Please call us at [phone] if you have not heard about the biopsies in 3 weeks.    SIGNATURES/CONFIDENTIALITY: You and/or your care partner have signed paperwork which will be entered into your electronic medical record.  These signatures attest to the fact that that the information above on your After Visit  Summary has been reviewed and is understood.  Full responsibility of the confidentiality of this discharge information lies with you and/or your care-partner. 

## 2020-08-26 NOTE — Progress Notes (Signed)
Pt. Asleep when Dr. Rush Landmark came into the RR.  Pt. Held in RR prior to D/C until Dr. Rush Landmark available to come back and speak to pt. Re: results of procedure.

## 2020-08-26 NOTE — Progress Notes (Signed)
Report to PACU, RN, vss, BBS= Clear.  

## 2020-08-26 NOTE — Op Note (Signed)
[facility] Patient Name: Derek Patterson Procedure Date: 08/26/2020 2:42 PM MRN: [id number] Endoscopist: Gabriel Mansouraty , MD Age: 64 Referring MD:  Date of Birth: 06/11/1956 Gender: Male Account #: [id number] Procedure:                Colonoscopy Indications:              Surveillance: Personal history of adenomatous                            polyps on last colonoscopy > 5 years ago Medicines:                Monitored Anesthesia Care Procedure:                Pre-Anesthesia Assessment:                           - Prior to the procedure, a History and Physical                            was performed, and patient medications and                            allergies were reviewed. The patient's tolerance of                            previous anesthesia was also reviewed. The risks                            and benefits of the procedure and the sedation                            options and risks were discussed with the patient.                            All questions were answered, and informed consent                            was obtained. Prior Anticoagulants: The patient has                            taken no previous anticoagulant or antiplatelet                            agents except for aspirin. ASA Grade Assessment: II                            - A patient with mild systemic disease. After                            reviewing the risks and benefits, the patient was                            deemed in satisfactory condition to undergo the                              procedure.                           After obtaining informed consent, the colonoscope                            was passed under direct vision. Throughout the                            procedure, the patient's blood pressure, pulse, and                            oxygen saturations were monitored continuously. The                            Colonoscope was introduced through the anus and                             advanced to the the cecum, identified by                            appendiceal orifice and ileocecal valve. The                            colonoscopy was performed without difficulty. The                            patient tolerated the procedure. The quality of the                            bowel preparation was adequate. The ileocecal                            valve, appendiceal orifice, and rectum were                            photographed. Scope In: 3:05:42 PM Scope Out: 3:23:24 PM Scope Withdrawal Time: 0 hours 11 minutes 27 seconds  Total Procedure Duration: 0 hours 17 minutes 42 seconds  Findings:                 Skin tags were found on perianal exam.                           The digital rectal exam findings include                            hemorrhoids. Pertinent negatives include no                            palpable rectal lesions.                           Two sessile polyps were found in the rectum and                              recto-sigmoid colon. The polyps were 2 to 4 mm in                            size. These polyps were removed with a cold snare.                            Resection and retrieval were complete.                           Normal mucosa was found in the entire colon                            otherwise.                           Non-bleeding non-thrombosed external and internal                            hemorrhoids were found during retroflexion, during                            perianal exam and during digital exam. The                            hemorrhoids were Grade II (internal hemorrhoids                            that prolapse but reduce spontaneously). Complications:            No immediate complications. Estimated Blood Loss:     Estimated blood loss was minimal. Impression:               - Perianal skin tags found on perianal exam.                           - Hemorrhoids found on digital rectal exam.                            - Two 2 to 4 mm polyps in the rectum and at the                            recto-sigmoid colon, removed with a cold snare.                            Resected and retrieved.                           - Normal mucosa in the entire examined colon                            otherwise.                           - Non-bleeding non-thrombosed external and internal  hemorrhoids. Recommendation:           - The patient will be observed post-procedure,                            until all discharge criteria are met.                           - Discharge patient to home.                           - Patient has a contact number available for                            emergencies. The signs and symptoms of potential                            delayed complications were discussed with the                            patient. Return to normal activities tomorrow.                            Written discharge instructions were provided to the                            patient.                           - High fiber diet.                           - Use FiberCon 1-2 tablets PO daily.                           - Continue present medications.                           - Await pathology results.                           - Repeat colonoscopy in 7 years for surveillance no                            matter pathology since patient has history of prior                            adenomatous colon polyps.                           - The findings and recommendations were discussed                            with the patient. Gabriel Mansouraty, MD 08/26/2020 3:28:58 PM 

## 2020-08-26 NOTE — Progress Notes (Signed)
VS-JD  Pt's states no medical or surgical changes since previsit or office visit.

## 2020-08-26 NOTE — Progress Notes (Signed)
Called to room to assist during endoscopic procedure.  Patient ID and intended procedure confirmed with present staff. Received instructions for my participation in the procedure from the performing physician.  

## 2020-08-30 ENCOUNTER — Telehealth: Payer: Self-pay

## 2020-08-30 ENCOUNTER — Telehealth: Payer: Self-pay | Admitting: *Deleted

## 2020-08-30 NOTE — Telephone Encounter (Signed)
  Follow up Call-  Call back number 08/26/2020  Post procedure Call Back phone  # [phone]  Permission to leave phone message Yes  Some recent data might be hidden     No answer at 2nd attempt follow up phone call.  Left message on voicemail.

## 2020-08-30 NOTE — Telephone Encounter (Signed)
Called [phone] and left a message we tried to reach pt for a follow up call. maw

## 2020-08-30 NOTE — Telephone Encounter (Signed)
Pt called  Back and is doing fine.

## 2020-09-05 ENCOUNTER — Encounter: Payer: Self-pay | Admitting: Gastroenterology

## 2020-09-15 ENCOUNTER — Ambulatory Visit (INDEPENDENT_AMBULATORY_CARE_PROVIDER_SITE_OTHER): Payer: 59 | Admitting: Internal Medicine

## 2020-09-15 ENCOUNTER — Encounter: Payer: Self-pay | Admitting: Internal Medicine

## 2020-09-15 VITALS — BP 130/81 | HR 87 | Temp 98.2°F | Ht 71.0 in | Wt 151.1 lb

## 2020-09-15 DIAGNOSIS — R040 Epistaxis: Secondary | ICD-10-CM

## 2020-09-15 DIAGNOSIS — Z23 Encounter for immunization: Secondary | ICD-10-CM

## 2020-09-15 DIAGNOSIS — Q282 Arteriovenous malformation of cerebral vessels: Secondary | ICD-10-CM | POA: Diagnosis not present

## 2020-09-15 DIAGNOSIS — Z0000 Encounter for general adult medical examination without abnormal findings: Secondary | ICD-10-CM | POA: Diagnosis not present

## 2020-09-15 NOTE — Patient Instructions (Signed)
Derek Patterson,  It was a pleasure seeing you in clinic. Today we discussed:   Nose bleed: I will do some blood work for this and will let you know of the results. I recommend using a humidifier at home.  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   If you have any questions or concerns, please call our clinic at [phone] between 9am-5pm and after hours call [phone] and ask for the internal medicine resident on call. If you feel you are having a medical emergency please call 911.   Thank you, we look forward to helping you remain healthy!

## 2020-09-16 DIAGNOSIS — R040 Epistaxis: Secondary | ICD-10-CM | POA: Insufficient documentation

## 2020-09-16 DIAGNOSIS — Q282 Arteriovenous malformation of cerebral vessels: Secondary | ICD-10-CM | POA: Insufficient documentation

## 2020-09-16 LAB — CBC
Hematocrit: 48.7 % (ref 37.5–51.0)
Hemoglobin: 16.8 g/dL (ref 13.0–17.7)
MCH: 27.8 pg (ref 26.6–33.0)
MCHC: 34.5 g/dL (ref 31.5–35.7)
MCV: 81 fL (ref 79–97)
Platelets: 321 10*3/uL (ref 150–450)
RBC: 6.04 x10E6/uL — ABNORMAL HIGH (ref 4.14–5.80)
RDW: 13.7 % (ref 11.6–15.4)
WBC: 5.3 10*3/uL (ref 3.4–10.8)

## 2020-09-16 NOTE — Progress Notes (Signed)
Called patient to discuss lab results. Unable to reach at this time. LVM to return call for lab results.   Lab results wnl. Platelets and Hb wnl. Recommend for saline nasal spray and humidifier at night.
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
## 2020-09-16 NOTE — Progress Notes (Signed)
DOS 09/15/20 Internal Medicine Clinic Attending  Case discussed with Dr. Marva Panda  At the time of the visit.  We reviewed the resident's history and exam and pertinent patient test results.  I agree with the assessment, diagnosis, and plan of care documented in the resident's note.

## 2020-09-16 NOTE — Assessment & Plan Note (Signed)
Patient is presenting with few weeks of epistaxis with 2-3 episodes noted. He notes that these are usually self limited and resolve with application of pressure. Denies any gingival bleeding or any family history of epistaxis. He does note that he used to have episodes of epistaxis as a child but has not had any since then. He does note that his wife has recently turned on the central heat at home. On exam, no nasal polyps or active bleeding noted. Does have some erythema of the left nare compared to right. CBC with normal Hb and platelet count.  Suspect epistaxis due to use of central heat.  Plan: Recommended for use of nasal saline spray and humidifier at night  F/u if symptoms persist

## 2020-09-16 NOTE — Assessment & Plan Note (Signed)
Patient received flu vaccine at this visit
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
## 2020-09-20 ENCOUNTER — Telehealth (HOSPITAL_COMMUNITY): Payer: Self-pay | Admitting: Radiology

## 2020-09-20 NOTE — Telephone Encounter (Signed)
Called pt, left VM. Per Deveshwar pt is to have cerebral angiogram. Left message for him to call to schedule. JM

## 2020-09-27 ENCOUNTER — Other Ambulatory Visit: Payer: Self-pay

## 2020-09-27 MED ORDER — TRELEGY ELLIPTA 200-62.5-25 MCG/INH IN AEPB
1.0000 | INHALATION_SPRAY | Freq: Every day | RESPIRATORY_TRACT | 4 refills | Status: DC
Start: 2020-09-27 — End: 2020-12-25

## 2020-09-27 NOTE — Telephone Encounter (Signed)
Need refill on Fluticasone-Umeclidin-Vilant (TRELEGY ELLIPTA) 200-62.5-25 MCG/INH AEPB ;pt contact [phone]    PHARMACY: [facility]  FAX# [phone]

## 2020-09-27 NOTE — Telephone Encounter (Signed)
Rx printed

## 2020-09-27 NOTE — Telephone Encounter (Addendum)
Rx for Evlyn Courier faxed to [facility] at [phone]. Fax confirmation receipt received. Hubbard Hartshorn, BSN, RN-BC

## 2020-09-29 ENCOUNTER — Telehealth (HOSPITAL_COMMUNITY): Payer: Self-pay | Admitting: Radiology

## 2020-09-29 ENCOUNTER — Other Ambulatory Visit (HOSPITAL_COMMUNITY): Payer: Self-pay | Admitting: Interventional Radiology

## 2020-09-29 DIAGNOSIS — Q282 Arteriovenous malformation of cerebral vessels: Secondary | ICD-10-CM

## 2020-09-29 NOTE — Telephone Encounter (Signed)
Called pt, left VM for him to call to schedule angiogram with Deveshwar. JM

## 2020-09-30 ENCOUNTER — Telehealth: Payer: Self-pay

## 2020-09-30 NOTE — Telephone Encounter (Signed)
Returned call to patient. No answer. Left message on VM requesting return call if still needed. Hubbard Hartshorn, RN, BSN

## 2020-09-30 NOTE — Telephone Encounter (Signed)
Pls contact pt [phone] regarding medicine

## 2020-11-25 ENCOUNTER — Other Ambulatory Visit: Payer: Self-pay | Admitting: Internal Medicine

## 2020-11-25 DIAGNOSIS — J449 Chronic obstructive pulmonary disease, unspecified: Secondary | ICD-10-CM

## 2020-11-25 NOTE — Telephone Encounter (Signed)
Need refill on albuterol (VENTOLIN HFA) 108 (90 Base) MCG/ACT inhaler ;pt contact   [place], [place] - [address]

## 2020-12-01 ENCOUNTER — Encounter (HOSPITAL_COMMUNITY): Payer: Self-pay

## 2020-12-01 ENCOUNTER — Other Ambulatory Visit: Payer: Self-pay

## 2020-12-01 ENCOUNTER — Emergency Department (HOSPITAL_COMMUNITY): Payer: 59

## 2020-12-01 ENCOUNTER — Inpatient Hospital Stay (HOSPITAL_COMMUNITY)
Admission: EM | Admit: 2020-12-01 | Discharge: 2020-12-02 | DRG: 190 | Disposition: A | Discharge status: Home / Self Care | Payer: 59 | Attending: Internal Medicine | Admitting: Internal Medicine

## 2020-12-01 DIAGNOSIS — R5383 Other fatigue: Secondary | ICD-10-CM | POA: Diagnosis present

## 2020-12-01 DIAGNOSIS — N182 Chronic kidney disease, stage 2 (mild): Secondary | ICD-10-CM | POA: Diagnosis present

## 2020-12-01 DIAGNOSIS — R040 Epistaxis: Secondary | ICD-10-CM | POA: Diagnosis present

## 2020-12-01 DIAGNOSIS — J441 Chronic obstructive pulmonary disease with (acute) exacerbation: Principal | ICD-10-CM | POA: Diagnosis present

## 2020-12-01 DIAGNOSIS — E785 Hyperlipidemia, unspecified: Secondary | ICD-10-CM | POA: Diagnosis present

## 2020-12-01 DIAGNOSIS — Z823 Family history of stroke: Secondary | ICD-10-CM

## 2020-12-01 DIAGNOSIS — Z8249 Family history of ischemic heart disease and other diseases of the circulatory system: Secondary | ICD-10-CM

## 2020-12-01 DIAGNOSIS — K219 Gastro-esophageal reflux disease without esophagitis: Secondary | ICD-10-CM | POA: Diagnosis present

## 2020-12-01 DIAGNOSIS — K635 Polyp of colon: Secondary | ICD-10-CM | POA: Diagnosis present

## 2020-12-01 DIAGNOSIS — Z20822 Contact with and (suspected) exposure to covid-19: Secondary | ICD-10-CM | POA: Diagnosis present

## 2020-12-01 DIAGNOSIS — F32A Depression, unspecified: Secondary | ICD-10-CM | POA: Diagnosis present

## 2020-12-01 DIAGNOSIS — R002 Palpitations: Secondary | ICD-10-CM | POA: Diagnosis present

## 2020-12-01 DIAGNOSIS — Z79899 Other long term (current) drug therapy: Secondary | ICD-10-CM

## 2020-12-01 DIAGNOSIS — Z833 Family history of diabetes mellitus: Secondary | ICD-10-CM

## 2020-12-01 DIAGNOSIS — I129 Hypertensive chronic kidney disease with stage 1 through stage 4 chronic kidney disease, or unspecified chronic kidney disease: Secondary | ICD-10-CM | POA: Diagnosis present

## 2020-12-01 DIAGNOSIS — Q282 Arteriovenous malformation of cerebral vessels: Secondary | ICD-10-CM

## 2020-12-01 DIAGNOSIS — N400 Enlarged prostate without lower urinary tract symptoms: Secondary | ICD-10-CM | POA: Diagnosis present

## 2020-12-01 DIAGNOSIS — K029 Dental caries, unspecified: Secondary | ICD-10-CM | POA: Diagnosis present

## 2020-12-01 DIAGNOSIS — M1990 Unspecified osteoarthritis, unspecified site: Secondary | ICD-10-CM | POA: Diagnosis present

## 2020-12-01 DIAGNOSIS — I4510 Unspecified right bundle-branch block: Secondary | ICD-10-CM | POA: Diagnosis present

## 2020-12-01 DIAGNOSIS — Z7982 Long term (current) use of aspirin: Secondary | ICD-10-CM

## 2020-12-01 DIAGNOSIS — F17210 Nicotine dependence, cigarettes, uncomplicated: Secondary | ICD-10-CM | POA: Diagnosis present

## 2020-12-01 DIAGNOSIS — F1911 Other psychoactive substance abuse, in remission: Secondary | ICD-10-CM | POA: Diagnosis present

## 2020-12-01 LAB — I-STAT VENOUS BLOOD GAS, ED
Acid-Base Excess: 4 mmol/L — ABNORMAL HIGH (ref 0.0–2.0)
Bicarbonate: 31.8 mmol/L — ABNORMAL HIGH (ref 20.0–28.0)
Calcium, Ion: 1.21 mmol/L (ref 1.15–1.40)
HCT: 49 % (ref 39.0–52.0)
Hemoglobin: 16.7 g/dL (ref 13.0–17.0)
O2 Saturation: 92 %
Potassium: 3.7 mmol/L (ref 3.5–5.1)
Sodium: 139 mmol/L (ref 135–145)
TCO2: 34 mmol/L — ABNORMAL HIGH (ref 22–32)
pCO2, Ven: 60.3 mmHg — ABNORMAL HIGH (ref 44.0–60.0)
pH, Ven: 7.33 (ref 7.250–7.430)
pO2, Ven: 72 mmHg — ABNORMAL HIGH (ref 32.0–45.0)

## 2020-12-01 LAB — CBC WITH DIFFERENTIAL/PLATELET
Abs Immature Granulocytes: 0.01 10*3/uL (ref 0.00–0.07)
Basophils Absolute: 0 10*3/uL (ref 0.0–0.1)
Basophils Relative: 1 %
Eosinophils Absolute: 0.1 10*3/uL (ref 0.0–0.5)
Eosinophils Relative: 2 %
HCT: 49.5 % (ref 39.0–52.0)
Hemoglobin: 16.2 g/dL (ref 13.0–17.0)
Immature Granulocytes: 0 %
Lymphocytes Relative: 52 %
Lymphs Abs: 3.5 10*3/uL (ref 0.7–4.0)
MCH: 27.7 pg (ref 26.0–34.0)
MCHC: 32.7 g/dL (ref 30.0–36.0)
MCV: 84.6 fL (ref 80.0–100.0)
Monocytes Absolute: 0.6 10*3/uL (ref 0.1–1.0)
Monocytes Relative: 8 %
Neutro Abs: 2.5 10*3/uL (ref 1.7–7.7)
Neutrophils Relative %: 37 %
Platelets: 276 10*3/uL (ref 150–400)
RBC: 5.85 MIL/uL — ABNORMAL HIGH (ref 4.22–5.81)
RDW: 14 % (ref 11.5–15.5)
WBC: 6.8 10*3/uL (ref 4.0–10.5)
nRBC: 0 % (ref 0.0–0.2)

## 2020-12-01 MED ORDER — METHYLPREDNISOLONE SODIUM SUCC 125 MG IJ SOLR
125.0000 mg | Freq: Once | INTRAMUSCULAR | Status: AC
  Administered 2020-12-01: 23:00:00 125 mg via INTRAVENOUS
  Filled 2020-12-01: qty 2

## 2020-12-01 MED ORDER — MAGNESIUM SULFATE 2 GM/50ML IV SOLN
2.0000 g | Freq: Once | INTRAVENOUS | Status: AC
  Administered 2020-12-01: 23:00:00 2 g via INTRAVENOUS
  Filled 2020-12-01: qty 50
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
## 2020-12-01 NOTE — ED Triage Notes (Signed)
Patient arrives with [facility] from home, has been short of breath, about 2 hours-patient got worse with L sided chest pain, tried nebs at home without relief, given neb by EMS  EMS Vitals  18 L FA  BP 142/68 66 HR 100% on neb

## 2020-12-02 ENCOUNTER — Encounter (HOSPITAL_COMMUNITY): Payer: Self-pay | Admitting: Internal Medicine

## 2020-12-02 ENCOUNTER — Other Ambulatory Visit (HOSPITAL_COMMUNITY): Payer: Self-pay | Admitting: Student

## 2020-12-02 DIAGNOSIS — Z7982 Long term (current) use of aspirin: Secondary | ICD-10-CM | POA: Diagnosis not present

## 2020-12-02 DIAGNOSIS — R5383 Other fatigue: Secondary | ICD-10-CM | POA: Diagnosis present

## 2020-12-02 DIAGNOSIS — F1911 Other psychoactive substance abuse, in remission: Secondary | ICD-10-CM | POA: Diagnosis present

## 2020-12-02 DIAGNOSIS — Z20822 Contact with and (suspected) exposure to covid-19: Secondary | ICD-10-CM | POA: Diagnosis present

## 2020-12-02 DIAGNOSIS — E785 Hyperlipidemia, unspecified: Secondary | ICD-10-CM | POA: Diagnosis present

## 2020-12-02 DIAGNOSIS — N400 Enlarged prostate without lower urinary tract symptoms: Secondary | ICD-10-CM | POA: Diagnosis present

## 2020-12-02 DIAGNOSIS — F32A Depression, unspecified: Secondary | ICD-10-CM | POA: Diagnosis present

## 2020-12-02 DIAGNOSIS — Z823 Family history of stroke: Secondary | ICD-10-CM | POA: Diagnosis not present

## 2020-12-02 DIAGNOSIS — Z79899 Other long term (current) drug therapy: Secondary | ICD-10-CM | POA: Diagnosis not present

## 2020-12-02 DIAGNOSIS — F17210 Nicotine dependence, cigarettes, uncomplicated: Secondary | ICD-10-CM | POA: Diagnosis present

## 2020-12-02 DIAGNOSIS — K219 Gastro-esophageal reflux disease without esophagitis: Secondary | ICD-10-CM | POA: Diagnosis present

## 2020-12-02 DIAGNOSIS — J441 Chronic obstructive pulmonary disease with (acute) exacerbation: Principal | ICD-10-CM

## 2020-12-02 DIAGNOSIS — R040 Epistaxis: Secondary | ICD-10-CM | POA: Diagnosis present

## 2020-12-02 DIAGNOSIS — Z833 Family history of diabetes mellitus: Secondary | ICD-10-CM | POA: Diagnosis not present

## 2020-12-02 DIAGNOSIS — R002 Palpitations: Secondary | ICD-10-CM | POA: Diagnosis present

## 2020-12-02 DIAGNOSIS — K029 Dental caries, unspecified: Secondary | ICD-10-CM | POA: Diagnosis present

## 2020-12-02 DIAGNOSIS — I4510 Unspecified right bundle-branch block: Secondary | ICD-10-CM | POA: Diagnosis present

## 2020-12-02 DIAGNOSIS — I129 Hypertensive chronic kidney disease with stage 1 through stage 4 chronic kidney disease, or unspecified chronic kidney disease: Secondary | ICD-10-CM | POA: Diagnosis present

## 2020-12-02 DIAGNOSIS — Z8249 Family history of ischemic heart disease and other diseases of the circulatory system: Secondary | ICD-10-CM | POA: Diagnosis not present

## 2020-12-02 DIAGNOSIS — Q282 Arteriovenous malformation of cerebral vessels: Secondary | ICD-10-CM | POA: Diagnosis not present

## 2020-12-02 DIAGNOSIS — M1990 Unspecified osteoarthritis, unspecified site: Secondary | ICD-10-CM | POA: Diagnosis present

## 2020-12-02 DIAGNOSIS — N182 Chronic kidney disease, stage 2 (mild): Secondary | ICD-10-CM | POA: Diagnosis present

## 2020-12-02 DIAGNOSIS — K635 Polyp of colon: Secondary | ICD-10-CM | POA: Diagnosis present

## 2020-12-02 LAB — COMPREHENSIVE METABOLIC PANEL
ALT: 30 U/L (ref 0–44)
AST: 47 U/L — ABNORMAL HIGH (ref 15–41)
Albumin: 3.9 g/dL (ref 3.5–5.0)
Alkaline Phosphatase: 86 U/L (ref 38–126)
Anion gap: 9 (ref 5–15)
BUN: 18 mg/dL (ref 8–23)
CO2: 28 mmol/L (ref 22–32)
Calcium: 9.7 mg/dL (ref 8.9–10.3)
Chloride: 102 mmol/L (ref 98–111)
Creatinine, Ser: 1.4 mg/dL — ABNORMAL HIGH (ref 0.61–1.24)
GFR, Estimated: 56 mL/min — ABNORMAL LOW (ref >60)
Glucose, Bld: 115 mg/dL — ABNORMAL HIGH (ref 70–99)
Potassium: 3.8 mmol/L (ref 3.5–5.1)
Sodium: 139 mmol/L (ref 135–145)
Total Bilirubin: 1 mg/dL (ref 0.3–1.2)
Total Protein: 6.6 g/dL (ref 6.5–8.1)

## 2020-12-02 LAB — RESPIRATORY PANEL BY PCR

## 2020-12-02 LAB — TROPONIN I (HIGH SENSITIVITY): Troponin I (High Sensitivity): 5 ng/L (ref <18)

## 2020-12-02 LAB — RESP PANEL BY RT-PCR (FLU A&B, COVID) ARPGX2
Influenza A by PCR: NEGATIVE
Influenza B by PCR: NEGATIVE
SARS Coronavirus 2 by RT PCR: NEGATIVE

## 2020-12-02 LAB — HIV ANTIBODY (ROUTINE TESTING W REFLEX): HIV Screen 4th Generation wRfx: NONREACTIVE

## 2020-12-02 MED ORDER — NICOTINE 7 MG/24HR TD PT24: 7.0000 mg | MEDICATED_PATCH | Freq: Every day | TRANSDERMAL | 1 refills | Status: DC

## 2020-12-02 MED ORDER — ALBUTEROL SULFATE HFA 108 (90 BASE) MCG/ACT IN AERS: 1.0000 | INHALATION_SPRAY | RESPIRATORY_TRACT | Status: DC | PRN

## 2020-12-02 MED ORDER — IPRATROPIUM-ALBUTEROL 0.5-2.5 (3) MG/3ML IN SOLN: 3.0000 mL | RESPIRATORY_TRACT | Status: DC | PRN

## 2020-12-02 MED ORDER — HYDROCHLOROTHIAZIDE 25 MG PO TABS
25.0000 mg | ORAL_TABLET | Freq: Every day | ORAL | Status: DC
  Administered 2020-12-02: 09:00:00 25 mg via ORAL
  Filled 2020-12-02: qty 1

## 2020-12-02 MED ORDER — TAMSULOSIN HCL 0.4 MG PO CAPS: 0.4000 mg | ORAL_CAPSULE | Freq: Every day | ORAL | Status: DC

## 2020-12-02 MED ORDER — GUAIFENESIN ER 600 MG PO TB12
600.0000 mg | ORAL_TABLET | Freq: Two times a day (BID) | ORAL | Status: DC
  Administered 2020-12-02: 11:00:00 600 mg via ORAL
  Filled 2020-12-02: qty 1

## 2020-12-02 MED ORDER — PREDNISONE 20 MG PO TABS
40.0000 mg | ORAL_TABLET | Freq: Every day | ORAL | Status: DC
  Administered 2020-12-02: 09:00:00 40 mg via ORAL
  Filled 2020-12-02: qty 2

## 2020-12-02 MED ORDER — FLUTICASONE-UMECLIDIN-VILANT 200-62.5-25 MCG/INH IN AEPB: 1.0000 | INHALATION_SPRAY | Freq: Every day | RESPIRATORY_TRACT | Status: DC

## 2020-12-02 MED ORDER — IPRATROPIUM-ALBUTEROL 0.5-2.5 (3) MG/3ML IN SOLN
3.0000 mL | RESPIRATORY_TRACT | Status: DC
  Administered 2020-12-02: 05:00:00 3 mL via RESPIRATORY_TRACT
  Filled 2020-12-02 (×2): qty 3

## 2020-12-02 MED ORDER — ENOXAPARIN SODIUM 30 MG/0.3ML ~~LOC~~ SOLN: 30.0000 mg | SUBCUTANEOUS | Status: DC

## 2020-12-02 MED ORDER — ASPIRIN EC 81 MG PO TBEC
81.0000 mg | DELAYED_RELEASE_TABLET | Freq: Every day | ORAL | Status: DC
  Administered 2020-12-02: 09:00:00 81 mg via ORAL
  Filled 2020-12-02: qty 1

## 2020-12-02 MED ORDER — NICOTINE 7 MG/24HR TD PT24
7.0000 mg | MEDICATED_PATCH | Freq: Every day | TRANSDERMAL | Status: DC
  Filled 2020-12-02: qty 1

## 2020-12-02 MED ORDER — FLUTICASONE FUROATE-VILANTEROL 100-25 MCG/INH IN AEPB
1.0000 | INHALATION_SPRAY | Freq: Every day | RESPIRATORY_TRACT | Status: DC
  Filled 2020-12-02: qty 28

## 2020-12-02 MED ORDER — UMECLIDINIUM BROMIDE 62.5 MCG/INH IN AEPB
1.0000 | INHALATION_SPRAY | Freq: Every day | RESPIRATORY_TRACT | Status: DC
  Filled 2020-12-02: qty 7

## 2020-12-02 MED ORDER — PREDNISONE 20 MG PO TABS: 40.0000 mg | ORAL_TABLET | Freq: Every day | ORAL | 0 refills | Status: DC

## 2020-12-02 MED FILL — predniSONE 20 MG TABS: 20 | 4 days supply | Qty: 8 | Fill #0

## 2020-12-02 NOTE — ED Notes (Signed)
Pt ambulated to bathroom with assistance.

## 2020-12-02 NOTE — Progress Notes (Signed)
Pt removed from BIPAP and placed on 3L [place]. WOB WNL.

## 2020-12-02 NOTE — ED Notes (Signed)
Lunch Tray Ordered @ 1046. 

## 2020-12-02 NOTE — ED Notes (Signed)
Pt ambulated to bathroom without difficulty.

## 2020-12-02 NOTE — Discharge Summary (Signed)
Name: Derek Patterson MRN: [id number] DOB: October 14, 1956 64 y.o. PCP: Derek Heck, MD  Date of Admission: 12/01/2020 10:50 PM Date of Discharge: 12/02/2020 12/02/2020 Attending Physician: No att. providers found  Discharge Diagnosis: 1. Acute COPD exacerbation   Discharge Medications: Allergies as of 12/02/2020   No Known Allergies     Medication List    TAKE these medications   albuterol 108 (90 Base) MCG/ACT inhaler Commonly known as: VENTOLIN HFA inhale 1-2 PUFFS into THE lungs EVERY 4 HOURS AS NEEDED FOR FOR WHEEZING OR SHORTNESS OF BREATH What changed: See the new instructions.   aspirin 81 MG EC tablet Take 81 mg by mouth daily.   CENTRUM SILVER 50+MEN PO Take 1 tablet by mouth daily.   fluticasone 50 MCG/ACT nasal spray Commonly known as: Flonase Place 2 sprays into both nostrils daily. What changed:   when to take this  reasons to take this   hydrochlorothiazide 25 MG tablet Commonly known as: HYDRODIURIL TAKE 1 TABLET BY MOUTH EVERY DAY   nicotine 7 mg/24hr patch Commonly known as: NICODERM CQ - dosed in mg/24 hr Place 1 patch (7 mg total) onto the skin daily.   predniSONE 20 MG tablet Commonly known as: DELTASONE Take 2 tablets (40 mg total) by mouth daily with breakfast for 4 doses.   tamsulosin 0.4 MG Caps capsule Commonly known as: FLOMAX TAKE 1 CAPSULE BY MOUTH EVERY DAY AFTER SUPPER What changed: See the new instructions.   Trelegy Ellipta 200-62.5-25 MCG/INH Aepb Generic drug: Fluticasone-Umeclidin-Vilant Inhale 1 puff into the lungs daily.       Disposition and follow-up:   Derek Patterson was discharged from [facility] in Stable condition.  At the hospital follow up visit please address:  1.  Acute COPD Exacerbation. PFTs in 01/2020 with FEV1/FVC 62% of predicted. Uses albuterol up to 6x daily, so would benefit from adjustment of prescriptions. Gave BiPAP overnight and steroids with good response. Afebrile and no  leukocytosis so antibiotics held. Sent home with prednisone 40mg  for total 5 day course. Advised to continue home albuterol inhaler prn, trelegy ellipta daily, and flonase daily. Will f/u with [facility] and PCP as outpatient.  2.  Labs / imaging needed at time of follow-up: CBC, BMP  3.  Pending labs/ test needing follow-up: None  Follow-up Appointments:  Follow-up Information    Derek Patterson. Schedule an appointment as soon as possible for a visit in 2 week(s).   Specialty: Pulmonology Contact information: [address] [place] [phone] [facility] Course by problem list: 1. Acute COPD exacerbation. Patient presented to the ED with progressively worsening SHOB and cough and was found to have an acute COPD exacerbation. Respiratory viral panel negative, CXR negative. No evidence of underlying infection (afebrile, no leukocytosis). Noted to have mild hypercarbia on VBG with pCO2 of 60, but this is likely chronic hypercarbia given elevated bicarbonate and normal pH. Initially placed on BiPAP yesterday, but transitioned to room air today. No SHOB on exam today and O2 sats have been >90% on room air. Patient received one dose of solumedrol 125mg  in the ED. Continued with steroids and transitioned patient to prednisone 40mg  daily to complete a 5-day course. No indication for antibiotics, so they were held. Continued home trelegy daily and albuterol prn. Encouraged smoking cessation. Patient medically stable for discharge today with outpatient follow-up with PCP  and Derek Patterson [facility]ology.  HTN - chronic and stable. Continued home HCTZ 25mg  daily.  CKD stage 2 - chronic and stable. Baseline Cr around 1.2. On admission, Cr 1.4.  BPH - chronic and stable. Continued home flomax 0.4mg .  Discharge Vitals:   BP 133/89   Pulse 82   Temp 98 F (36.7 C) (Oral)   Resp 19   Ht 5\' 11"  (1.803 m)   Wt  68 kg   SpO2 93%   BMI 20.92 kg/m   Pertinent Labs, Studies, and Procedures:  CBC Latest Ref Rng & Units 12/01/2020 12/01/2020 09/15/2020  WBC 4.0 - 10.5 K/uL - 6.8 5.3  Hemoglobin 13.0 - 17.0 g/dL 16.7 16.2 16.8  Hematocrit 39.0 - 52.0 % 49.0 49.5 48.7  Platelets 150 - 400 K/uL - 276 321   CMP     Component Value Date/Time   NA 139 12/01/2020 2331   NA 138 06/07/2020 1450   K 3.7 12/01/2020 2331   CL 102 12/01/2020 2316   CO2 28 12/01/2020 2316   GLUCOSE 115 (H) 12/01/2020 2316   BUN 18 12/01/2020 2316   BUN 15 06/07/2020 1450   CREATININE 1.40 (H) 12/01/2020 2316   CREATININE 1.15 12/22/2014 1702   CALCIUM 9.7 12/01/2020 2316   CALCIUM 10.0 10/15/2008 1346   PROT 6.6 12/01/2020 2316   ALBUMIN 3.9 12/01/2020 2316   AST 47 (H) 12/01/2020 2316   ALT 30 12/01/2020 2316   ALKPHOS 86 12/01/2020 2316   BILITOT 1.0 12/01/2020 2316   GFRNONAA 56 (L) 12/01/2020 2316   GFRNONAA 70 12/22/2014 1702   GFRAA 68 06/07/2020 1450   GFRAA 81 12/22/2014 1702   VBG    Component Value Date/Time   HCO3 31.8 (H) 12/01/2020 2331   TCO2 34 (H) 12/01/2020 2331   O2SAT 92.0 12/01/2020 2331   Respiratory Panel by PCR -- Negative  Troponin I: 5   Discharge Instructions: Discharge Instructions    Call MD for:  difficulty breathing, headache or visual disturbances   Complete by: As directed    Call MD for:  extreme fatigue   Complete by: As directed    Call MD for:  hives   Complete by: As directed    Call MD for:  persistant dizziness or light-headedness   Complete by: As directed    Call MD for:  persistant nausea and vomiting   Complete by: As directed    Call MD for:  redness, tenderness, or signs of infection (pain, swelling, redness, odor or green/yellow discharge around incision site)   Complete by: As directed    Call MD for:  severe uncontrolled pain   Complete by: As directed    Call MD for:  temperature >100.4   Complete by: As directed    Diet - low sodium heart  healthy   Complete by: As directed    Discharge instructions   Complete by: As directed    Derek Patterson, it was a pleasure taking Patterson of you during your time here. You came in with an exacerbation of your COPD and were treated for it.  Please pay attention to the following: 1. We are sending you home with a 4 day course of steroids. Please take 40mg  (2 tablets) each day for the next 4 days.  2. We are also sending you home with a prescription for nicotine patches. Please use them as directed. It is very important for you to stop smoking cigarettes as they can worsen your COPD. 3. Please  schedule an appointment to see [facility] in 2 weeks for follow up of your COPD.   Increase activity slowly   Complete by: As directed       Signed: Virl Axe, MD 12/05/2020, 4:45 PM   Pager: [phone]

## 2020-12-02 NOTE — Discharge Planning (Signed)
[facility] ([facility] will deliver Rx to pt at bedside prior to discharge home today.  
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
## 2020-12-02 NOTE — Progress Notes (Signed)
Subjective:   Overnight, patient on Bipap, which was discontinued early this morning.  On evaluation at bedside during rounds, patient reports he is feeling better, less short of breath. States he is hungry, and his throat is dry. Able to get some sleep overnight. States he is not on home oxygen. Denies fever/chills, nausea/vomiting, chest pain, palpitations, abdominal pain, dysuria, lower leg edema. Notes periodic difficulty with urination at night, but notes that he was recently started on flomax for this which has been helping. Had regular BM yesterday.   Patient states his wife has been encouraging to cut back and quit smoking. He states, "I think I can quit."  Discussed possible discharge today and patient agrees with this.   Objective:  Vital signs in last 24 hours: Vitals:   12/02/20 0758 12/02/20 1000 12/02/20 1100 12/02/20 1200  BP:  126/88 132/87 125/74  Pulse:  64 (!) 106 85  Resp:  (!) 21 (!) 27 (!) 21  Temp: 98.1 F (36.7 C)     TempSrc: Oral     SpO2:  95% 100% 95%  Weight:      Height:       Physical Exam Constitutional:      Appearance: He is well-developed. He is not ill-appearing.  HENT:     Head: Normocephalic and atraumatic.     Mouth/Throat:     Mouth: Mucous membranes are moist.     Pharynx: Oropharynx is clear.  Eyes:     Extraocular Movements: Extraocular movements intact.     Pupils: Pupils are equal, round, and reactive to light.  Cardiovascular:     Rate and Rhythm: Normal rate and regular rhythm.     Pulses: Normal pulses.     Heart sounds: Normal heart sounds. No murmur heard. No friction rub. No gallop.   Pulmonary:     Effort: Pulmonary effort is normal. No tachypnea, accessory muscle usage or respiratory distress.     Breath sounds: Normal breath sounds. No decreased breath sounds, wheezing, rhonchi or rales.     Comments: Off BiPAP, saturating >95% on room air. Periodic non-productive coughing during evaluation. Abdominal:      General: Bowel sounds are normal.     Palpations: Abdomen is soft. There is no mass.     Tenderness: There is no abdominal tenderness. There is no guarding or rebound.  Musculoskeletal:        General: Normal range of motion.     Right lower leg: No edema.     Left lower leg: No edema.  Skin:    General: Skin is warm and dry.  Neurological:     General: No focal deficit present.     Mental Status: He is alert and oriented to person, place, and time.  Psychiatric:        Mood and Affect: Mood normal.        Behavior: Behavior normal.     Assessment/Plan:  Active Problems:   COPD exacerbation ([place])  Alyson Locket. Milici is a 64 year old male with history of COPD, HTN, CKD stage 2, HLD, allergic rhinitis, cerebral AVM presenting for nonproductive cough and SHOB likely 2/2 COPD exacerbation, now medically stable for discharge.  COPD exacerbation (GOLD 3) Tobacco use disorder PFTs in 01/2020 with FEV1/FVC 62% of predicted. Seen at [facility] in 12/2019. Uses albuterol up to 6x daily when working, so likely needs adjustment of prescriptions. COVID negative. CXR with hyperinflated lungs but otherwise unremarkable. VBG consistent with chronic respiratory acidosis with metabolic  compensation. ED started patient on BiPAP, mag, steroids with good response. Negative respiratory panel. Holding abx given improvement and no signs of infection (afebrile, no leukocytosis). Discontinued BiPAP today, patient satting >95% on room air and denies SHOB at this time. Still having a mild nonproductive cough. -added mucinex for one dose -started regular diet -patient is medically stable for discharge today -will send home with prednisone 40mg  for 5 day course -continue home albuterol inhaler prn, trelegy ellipta daily, and flonase daily  -counseled on smoking cessation, would like some nicotine patches to help with cravings -f/u with  Pulm as outpatient  HTN Normotensive today. Continue home HCTZ  25mg  daily after discharge.  CKD stage 2 Chronic and stable with baseline Cr around 1.2. On admission, Cr 1.4.   BPH -continue home flomax 0.4mg  after discharge.   Prior to Admission Living Arrangement: Home Anticipated Discharge Location: Home Barriers to Discharge: None Dispo: Anticipated discharge today.   Virl Axe, MD 12/02/2020, 12:46 PM Pager: [phone] After 5pm on weekdays and 1pm on weekends: On Call pager [phone]

## 2020-12-21 ENCOUNTER — Ambulatory Visit (INDEPENDENT_AMBULATORY_CARE_PROVIDER_SITE_OTHER): Payer: 59 | Admitting: Internal Medicine

## 2020-12-21 ENCOUNTER — Encounter: Payer: Self-pay | Admitting: Internal Medicine

## 2020-12-21 DIAGNOSIS — J449 Chronic obstructive pulmonary disease, unspecified: Secondary | ICD-10-CM | POA: Diagnosis not present

## 2020-12-21 DIAGNOSIS — Z720 Tobacco use: Secondary | ICD-10-CM

## 2020-12-21 MED ORDER — BREO ELLIPTA 200-25 MCG/INH IN AEPB: 1.0000 | INHALATION_SPRAY | Freq: Every day | RESPIRATORY_TRACT | 2 refills | Status: DC

## 2020-12-21 MED ORDER — NICOTINE 7 MG/24HR TD PT24: 7.0000 mg | MEDICATED_PATCH | Freq: Every day | TRANSDERMAL | 1 refills | Status: DC
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
## 2020-12-21 NOTE — Assessment & Plan Note (Signed)
Still smoking ~4 cigarettes per day. Discussed importance of smoking cessation.   - sent in nicotine patches

## 2020-12-21 NOTE — Assessment & Plan Note (Signed)
Has been feeling well since he got home from the hospital. He has had a small cold with congestion since starting the nasal spray and has stopped it now. He was tested for covid last week and was negative. He has had the immuinzations and booster.  In the ER he was given Breo 100-25 mcg, which he feels is helping his COPD more than the trelegy. The trelegy seems to make his breathing worse before helping. He was using his albuterol inhaler every hour but now uses it around every two hours. He denies wheezing, shortness of breathing increased from baseline or cough, fever or chills. He continues to smoke ~4 cigarettes per day. He is not using nicotine patches, he thinks his insurance would not cover them before but he would like to try again.  PFTs done 01/2020 indicated both obstruction and restriction.   - discussed going for covid testing again as symptoms began after last test  - no history of increased eosinophils, but as he is having symptoms improvement from the [facility] I will send in refills and increase to 200/25 mcg qd. I am not sure his insurance will cover this an have sent message to pharmacy  - stop Trelegy once we confirm that he can actually get [facility]  - advised to return to clinic if he is still requiring albuterol q2h after increasing Breo  - smoking cessation counseling today. Sent in nicotine patches

## 2020-12-21 NOTE — Progress Notes (Signed)
Internal Medicine Clinic Attending  Case discussed with Dr. Seawell at the time of the visit.  We reviewed the resident's history and exam and pertinent patient test results.  I agree with the assessment, diagnosis, and plan of care documented in the resident's note.  Alexander Raines, M.D., Ph.D.  

## 2020-12-25 ENCOUNTER — Other Ambulatory Visit: Payer: Self-pay | Admitting: Internal Medicine

## 2020-12-25 DIAGNOSIS — J449 Chronic obstructive pulmonary disease, unspecified: Secondary | ICD-10-CM

## 2020-12-25 MED ORDER — BREO ELLIPTA 200-25 MCG/INH IN AEPB: 1.0000 | INHALATION_SPRAY | Freq: Every day | RESPIRATORY_TRACT | 2 refills | Status: DC

## 2020-12-30 ENCOUNTER — Other Ambulatory Visit: Payer: Self-pay | Admitting: Student

## 2020-12-30 DIAGNOSIS — J449 Chronic obstructive pulmonary disease, unspecified: Secondary | ICD-10-CM

## 2020-12-30 NOTE — Telephone Encounter (Signed)
Fluticasone rx faxed to [facility] @ [phone] per Dr Margy Clarks request. Confirmation received.

## 2021-02-08 ENCOUNTER — Other Ambulatory Visit: Payer: Self-pay | Admitting: Internal Medicine

## 2021-02-08 DIAGNOSIS — J449 Chronic obstructive pulmonary disease, unspecified: Secondary | ICD-10-CM

## 2021-02-21 ENCOUNTER — Other Ambulatory Visit: Payer: Self-pay | Admitting: Internal Medicine

## 2021-03-29 ENCOUNTER — Other Ambulatory Visit: Payer: Self-pay | Admitting: Internal Medicine

## 2021-03-29 DIAGNOSIS — J449 Chronic obstructive pulmonary disease, unspecified: Secondary | ICD-10-CM

## 2021-03-30 ENCOUNTER — Telehealth: Payer: Self-pay

## 2021-03-30 ENCOUNTER — Encounter: Payer: 59 | Admitting: Internal Medicine

## 2021-03-30 NOTE — Telephone Encounter (Signed)
Pt was wanting to cancel his appt because he is going out of town but wants to speak to the dr  Or RN

## 2021-03-30 NOTE — Telephone Encounter (Signed)
Return pt's call - stated he wants to talk to Dr Marva Panda; I told him she's seeing pts at this time. He's going out of town today; he canceled his appt for this afternoon also. Stated when he coughs, "on an empty stomach", he gets cramps "all over". I told pt we can re-schedule his appt next week with one of Dr Margy Clarks team members. He's agreeable - call transferred back to the front office; appt scheduled with Dr Konrad Penta on 4/28.

## 2021-04-07 ENCOUNTER — Other Ambulatory Visit: Payer: Self-pay

## 2021-04-07 ENCOUNTER — Ambulatory Visit (INDEPENDENT_AMBULATORY_CARE_PROVIDER_SITE_OTHER): Payer: 59 | Admitting: Internal Medicine

## 2021-04-07 ENCOUNTER — Encounter: Payer: Self-pay | Admitting: Internal Medicine

## 2021-04-07 ENCOUNTER — Telehealth (HOSPITAL_COMMUNITY): Payer: Self-pay

## 2021-04-07 VITALS — BP 134/81 | HR 89 | Temp 98.3°F | Ht 71.0 in | Wt 149.7 lb

## 2021-04-07 DIAGNOSIS — Z720 Tobacco use: Secondary | ICD-10-CM

## 2021-04-07 DIAGNOSIS — J449 Chronic obstructive pulmonary disease, unspecified: Secondary | ICD-10-CM | POA: Diagnosis not present

## 2021-04-07 DIAGNOSIS — Z122 Encounter for screening for malignant neoplasm of respiratory organs: Secondary | ICD-10-CM

## 2021-04-07 DIAGNOSIS — Q282 Arteriovenous malformation of cerebral vessels: Secondary | ICD-10-CM

## 2021-04-07 MED ORDER — SPIRIVA HANDIHALER 18 MCG IN CAPS: 18.0000 ug | ORAL_CAPSULE | Freq: Every day | RESPIRATORY_TRACT | 2 refills | Status: DC

## 2021-04-07 NOTE — Assessment & Plan Note (Signed)
Mentions continuing to reduce tobacco use. Down to 1-2 cigarettes daily. Discussed importance of smoking cessation  - C/w encourage smoking cessation

## 2021-04-07 NOTE — Assessment & Plan Note (Signed)
Presenting with hx of cerebral AV malformation. Previously was referred to Neuro IR but was unable to follow up due to lack of medical coverage. Derek Patterson mentions that he has insurance now. Denies any further headaches, vision changes, dizziness or light-headedness.[  A/P Prior CTA head showing dilated veins along midbrain and pons concerning for AV malformation vs fistula. Radiology recommended f/u with IR. Will make referral. - Referral for IR, Spoke with Dr.Deveshwar's office who has arranged follow up for Derek Patterson

## 2021-04-07 NOTE — Patient Instructions (Signed)
Dear Derek Patterson,  Thank you for allowing [place] to provide your care today. Today we discussed your copd    I have ordered no labs for you. I will call if any are abnormal.    Today we made the following changes to your medications:    Please start Spiriva inhaler once daily  Please follow-up in 3 months.    Please call the internal medicine center clinic if you have any questions or concerns, we may be able to help and keep you from a long and expensive emergency room wait. Our clinic and after hours phone number is [phone], the best time to call is Monday through Friday 9 am to 4 pm but there is always someone available 24/7 if you have an emergency. If you need medication refills please notify your pharmacy one week in advance and they will send [place] a request.    If you have not gotten the COVID vaccine, I recommend doing so:  You may get it at your local [facility] or [facility] OR To schedule an appointment for a COVID vaccine or be added to the vaccine wait list: Go to [email or web address]   OR Go to [email or web address]                  OR Call [phone]                                     OR Call [phone] and select Option 2  Thank you for choosing [place]   Chronic Obstructive Pulmonary Disease  Chronic obstructive pulmonary disease (COPD) is a long-term (chronic) lung problem. When you have COPD, it is hard for air to get in and out of your lungs. Usually the condition gets worse over time, and your lungs will never return to normal. There are things you can do to keep yourself as healthy as possible. What are the causes?  Smoking. This is the most common cause.  Certain genes passed from parent to child (inherited). What increases the risk?  Being exposed to secondhand smoke from cigarettes, pipes, or cigars.  Being exposed to chemicals and other irritants, such as fumes and dust in the work environment.  Having chronic lung conditions or  infections. What are the signs or symptoms?  Shortness of breath, especially during physical activity.  A long-term cough with a large amount of thick mucus. Sometimes, the cough may not have any mucus (dry cough).  Wheezing.  Breathing quickly.  Skin that looks gray or blue, especially in the fingers, toes, or lips.  Feeling tired (fatigue).  Weight loss.  Chest tightness.  Having infections often.  Episodes when breathing symptoms become much worse (exacerbations). At the later stages of this disease, you may have swelling in the ankles, feet, or legs. How is this treated?  Taking medicines.  Quitting smoking, if you smoke.  Rehabilitation. This includes steps to make your body work better. It may involve a team of specialists.  Doing exercises.  Making changes to your diet.  Using oxygen.  Lung surgery.  Lung transplant.  Comfort measures (palliative care). Follow these instructions at home: Medicines  Take over-the-counter and prescription medicines only as told by your doctor.  Talk to your doctor before taking any cough or allergy medicines. You may need to avoid medicines that cause your lungs to be dry. Lifestyle  If you smoke, stop smoking. Smoking makes the  problem worse.  Do not smoke or use any products that contain nicotine or tobacco. If you need help quitting, ask your doctor.  Avoid being around things that make your breathing worse. This may include smoke, chemicals, and fumes.  Stay active, but remember to rest as well.  Learn and use tips on how to manage stress and control your breathing.  Make sure you get enough sleep. Most adults need at least 7 hours of sleep every night.  Eat healthy foods. Eat smaller meals more often. Rest before meals. Controlled breathing Learn and use tips on how to control your breathing as told by your doctor. Try:  Breathing in (inhaling) through your nose for 1 second. Then, pucker your lips and  breath out (exhale) through your lips for 2 seconds.  Putting one hand on your belly (abdomen). Breathe in slowly through your nose for 1 second. Your hand on your belly should move out. Pucker your lips and breathe out slowly through your lips. Your hand on your belly should move in as you breathe out.   Controlled coughing Learn and use controlled coughing to clear mucus from your lungs. Follow these steps: 1. Lean your head a little forward. 2. Breathe in deeply. 3. Try to hold your breath for 3 seconds. 4. Keep your mouth slightly open while coughing 2 times. 5. Spit any mucus out into a tissue. 6. Rest and do the steps again 1 or 2 times as needed. General instructions  Make sure you get all the shots (vaccines) that your doctor recommends. Ask your doctor about a flu shot and a pneumonia shot.  Use oxygen therapy and pulmonary rehabilitation if told by your doctor. If you need home oxygen therapy, ask your doctor if you should buy a tool to measure your oxygen level (oximeter).  Make a COPD action plan with your doctor. This helps you to know what to do if you feel worse than usual.  Manage any other conditions you have as told by your doctor.  Avoid going outside when it is very hot, cold, or humid.  Avoid people who have a sickness you can catch (contagious).  Keep all follow-up visits. Contact a doctor if:  You cough up more mucus than usual.  There is a change in the color or thickness of the mucus.  It is harder to breathe than usual.  Your breathing is faster than usual.  You have trouble sleeping.  You need to use your medicines more often than usual.  You have trouble doing your normal activities such as getting dressed or walking around the house. Get help right away if:  You have shortness of breath while resting.  You have shortness of breath that stops you from: ? Being able to talk. ? Doing normal activities.  Your chest hurts for longer than 5  minutes.  Your skin color is more blue than usual.  Your pulse oximeter shows that you have low oxygen for longer than 5 minutes.  You have a fever.  You feel too tired to breathe normally. These symptoms may represent a serious problem that is an emergency. Do not wait to see if the symptoms will go away. Get medical help right away. Call your local emergency services (911 in the [place] Do not drive yourself to the hospital. Summary  Chronic obstructive pulmonary disease (COPD) is a long-term lung problem.  The way your lungs work will never return to normal. Usually the condition gets worse over time. There  are things you can do to keep yourself as healthy as possible.  Take over-the-counter and prescription medicines only as told by your doctor.  If you smoke, stop. Smoking makes the problem worse. This information is not intended to replace advice given to you by your health care provider. Make sure you discuss any questions you have with your health care provider. Document Revised: 10/05/2020 Document Reviewed: 10/05/2020 Elsevier Patient Education  2021 [employer].
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
## 2021-04-07 NOTE — Assessment & Plan Note (Signed)
Has >20 pack year smoking history. Current smoker. Discussed risk / benefit of low dose lung cancer screening. Derek Patterson express interest in getting chest CT.  - Low dose CT chest ordered

## 2021-04-07 NOTE — Telephone Encounter (Signed)
Called to schedule angiogram. Pt will discuss with wife to get a date and call back to schedule. AW

## 2021-04-07 NOTE — Assessment & Plan Note (Signed)
Ms.Maclellan is a 65 yo M w/ PMH of COPD presenting to [facility] for management of his chronic conditions. He mentions that he has been continuing to endorse shortness of breath with exertion. 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 He mentions that in the past he had difficulty getting his Trelegy due to financial constraints. He mentions being able to get Breo w/o difficulty with his current insurance. Also mentions using his albuterol inhaler frequently up to 4-5x daily during 'bad seasons.  A/P Presenting with COPD. GOLD B COPD with grade 3 obstruction based on last PFT. Currently on LABA, ICS but not on LAMA. Over-using his rescue inhaler. Need LAMA on board.  - Start tiotropium 21mcg daily - C/w fluticasone-vilanterol 200-7mcg daily - Refused f/u with pulm

## 2021-04-08 NOTE — Progress Notes (Signed)
Internal Medicine Clinic Attending  Case discussed with Dr. Lee  At the time of the visit.  We reviewed the resident's history and exam and pertinent patient test results.  I agree with the assessment, diagnosis, and plan of care documented in the resident's note.    

## 2021-04-13 ENCOUNTER — Other Ambulatory Visit: Payer: Self-pay | Admitting: Radiology

## 2021-04-14 ENCOUNTER — Other Ambulatory Visit (HOSPITAL_COMMUNITY): Payer: Self-pay | Admitting: Interventional Radiology

## 2021-04-14 ENCOUNTER — Other Ambulatory Visit: Payer: Self-pay

## 2021-04-14 ENCOUNTER — Ambulatory Visit (HOSPITAL_COMMUNITY)
Admission: RE | Admit: 2021-04-14 | Discharge: 2021-04-14 | Disposition: A | Discharge status: Home / Self Care | Payer: 59 | Source: Ambulatory Visit | Attending: Interventional Radiology | Admitting: Interventional Radiology

## 2021-04-14 ENCOUNTER — Encounter (HOSPITAL_COMMUNITY): Payer: Self-pay

## 2021-04-14 DIAGNOSIS — Z7982 Long term (current) use of aspirin: Secondary | ICD-10-CM | POA: Insufficient documentation

## 2021-04-14 DIAGNOSIS — Q282 Arteriovenous malformation of cerebral vessels: Secondary | ICD-10-CM

## 2021-04-14 DIAGNOSIS — I10 Essential (primary) hypertension: Secondary | ICD-10-CM | POA: Diagnosis not present

## 2021-04-14 DIAGNOSIS — F17210 Nicotine dependence, cigarettes, uncomplicated: Secondary | ICD-10-CM | POA: Insufficient documentation

## 2021-04-14 DIAGNOSIS — Z79899 Other long term (current) drug therapy: Secondary | ICD-10-CM | POA: Diagnosis not present

## 2021-04-14 DIAGNOSIS — J449 Chronic obstructive pulmonary disease, unspecified: Secondary | ICD-10-CM | POA: Diagnosis not present

## 2021-04-14 HISTORY — PX: IR ANGIO INTRA EXTRACRAN SEL COM CAROTID INNOMINATE BILAT MOD SED: IMG5360

## 2021-04-14 HISTORY — PX: IR US GUIDE VASC ACCESS RIGHT: IMG2390

## 2021-04-14 HISTORY — PX: IR ANGIO VERTEBRAL SEL SUBCLAVIAN INNOMINATE BILAT MOD SED: IMG5366

## 2021-04-14 LAB — BASIC METABOLIC PANEL
Anion gap: 9 (ref 5–15)
BUN: 21 mg/dL (ref 8–23)
CO2: 27 mmol/L (ref 22–32)
Calcium: 9.6 mg/dL (ref 8.9–10.3)
Chloride: 103 mmol/L (ref 98–111)
Creatinine, Ser: 1.3 mg/dL — ABNORMAL HIGH (ref 0.61–1.24)
GFR, Estimated: >60 mL/min (ref >60)
Glucose, Bld: 118 mg/dL — ABNORMAL HIGH (ref 70–99)
Potassium: 3.8 mmol/L (ref 3.5–5.1)
Sodium: 139 mmol/L (ref 135–145)

## 2021-04-14 LAB — CBC
HCT: 50.6 % (ref 39.0–52.0)
Hemoglobin: 16.6 g/dL (ref 13.0–17.0)
MCH: 27.5 pg (ref 26.0–34.0)
MCHC: 32.8 g/dL (ref 30.0–36.0)
MCV: 83.8 fL (ref 80.0–100.0)
Platelets: 273 10*3/uL (ref 150–400)
RBC: 6.04 MIL/uL — ABNORMAL HIGH (ref 4.22–5.81)
RDW: 13.4 % (ref 11.5–15.5)
WBC: 5.6 10*3/uL (ref 4.0–10.5)
nRBC: 0 % (ref 0.0–0.2)

## 2021-04-14 LAB — PROTIME-INR
INR: 1 (ref 0.8–1.2)
Prothrombin Time: 13.3 seconds (ref 11.4–15.2)

## 2021-04-14 MED ORDER — NITROGLYCERIN 0.4 MG SL SUBL
SUBLINGUAL_TABLET | SUBLINGUAL | Status: AC
  Filled 2021-04-14: qty 1

## 2021-04-14 MED ORDER — HEPARIN SODIUM (PORCINE) 1000 UNIT/ML IJ SOLN
INTRAMUSCULAR | Status: AC
  Filled 2021-04-14: qty 1

## 2021-04-14 MED ORDER — SODIUM CHLORIDE 0.9 % IV SOLN: INTRAVENOUS | Status: AC

## 2021-04-14 MED ORDER — NITROGLYCERIN 1 MG/10 ML FOR IR/CATH LAB
INTRA_ARTERIAL | Status: AC
  Filled 2021-04-14: qty 10

## 2021-04-14 MED ORDER — FENTANYL CITRATE (PF) 100 MCG/2ML IJ SOLN
INTRAMUSCULAR | Status: AC | PRN
  Administered 2021-04-14: 25 ug via INTRAVENOUS

## 2021-04-14 MED ORDER — LIDOCAINE HCL 1 % IJ SOLN
INTRAMUSCULAR | Status: AC | PRN
  Administered 2021-04-14: 5 mL

## 2021-04-14 MED ORDER — IOHEXOL 300 MG/ML  SOLN
100.0000 mL | Freq: Once | INTRAMUSCULAR | Status: AC | PRN
  Administered 2021-04-14: 25 mL

## 2021-04-14 MED ORDER — FENTANYL CITRATE (PF) 100 MCG/2ML IJ SOLN
INTRAMUSCULAR | Status: AC
  Filled 2021-04-14: qty 2

## 2021-04-14 MED ORDER — IOHEXOL 300 MG/ML  SOLN
100.0000 mL | Freq: Once | INTRAMUSCULAR | Status: AC | PRN
  Administered 2021-04-14: 50 mL

## 2021-04-14 MED ORDER — MIDAZOLAM HCL 2 MG/2ML IJ SOLN
INTRAMUSCULAR | Status: AC
  Filled 2021-04-14: qty 2

## 2021-04-14 MED ORDER — HEPARIN SODIUM (PORCINE) 1000 UNIT/ML IJ SOLN
INTRAMUSCULAR | Status: AC | PRN
  Administered 2021-04-14: 2000 [IU] via INTRA_ARTERIAL

## 2021-04-14 MED ORDER — SODIUM CHLORIDE 0.9 % IV SOLN: Freq: Once | INTRAVENOUS | Status: AC

## 2021-04-14 MED ORDER — NITROGLYCERIN 1 MG/10 ML FOR IR/CATH LAB
INTRA_ARTERIAL | Status: AC | PRN
  Administered 2021-04-14: 200 ug via INTRA_ARTERIAL

## 2021-04-14 MED ORDER — LIDOCAINE HCL 1 % IJ SOLN
INTRAMUSCULAR | Status: AC
  Filled 2021-04-14: qty 20

## 2021-04-14 MED ORDER — SODIUM CHLORIDE 0.9 % IV SOLN
INTRAVENOUS | Status: AC | PRN
  Administered 2021-04-14: 250 mL via INTRAVENOUS

## 2021-04-14 MED ORDER — MIDAZOLAM HCL 2 MG/2ML IJ SOLN
INTRAMUSCULAR | Status: AC | PRN
  Administered 2021-04-14: 1 mg via INTRAVENOUS

## 2021-04-14 MED ORDER — VERAPAMIL HCL 2.5 MG/ML IV SOLN
INTRAVENOUS | Status: AC
  Filled 2021-04-14: qty 2

## 2021-04-14 NOTE — Procedures (Signed)
S/P 4 vessel cerebral arteriogram. RT Rad approach. Findings. 1.Approx 31mm x 18 mm nidus  LT cerebellar AVM fed by Lt superior cerebellar and ?LT AICA with drainage into LT ICV and VAG.  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 S.Lateef Juncaj MD
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
## 2021-04-14 NOTE — Discharge Instructions (Addendum)
DRINK PLENTY OF FLUIDS OVER THE NEXT 2-3 DAYS. Radial Site Care  This sheet gives you information about how to care for yourself after your procedure. Your health care provider may also give you more specific instructions. If you have problems or questions, contact your health care provider. What can I expect after the procedure? After the procedure, it is common to have:  Bruising and tenderness at the catheter insertion area. Follow these instructions at home: Medicines  Take over-the-counter and prescription medicines only as told by your health care provider. Insertion site care  Follow instructions from your health care provider about how to take care of your insertion site. Make sure you: ? Wash your hands with soap and water before you change your bandage (dressing). If soap and water are not available, use hand sanitizer. ? Change your dressing as told by your health care provider. ? Leave stitches (sutures), skin glue, or adhesive strips in place. These skin closures may need to stay in place for 2 weeks or longer. If adhesive strip edges start to loosen and curl up, you may trim the loose edges. Do not remove adhesive strips completely unless your health care provider tells you to do that.  Check your insertion site every day for signs of infection. Check for: ? Redness, swelling, or pain. ? Fluid or blood. ? Pus or a bad smell. ? Warmth.  Do not take baths, swim, or use a hot tub until your health care provider approves.  You may shower 24-48 hours after the procedure, or as directed by your health care provider. ? Remove the dressing and gently wash the site with plain soap and water. ? Pat the area dry with a clean towel. ? Do not rub the site. That could cause bleeding.  Do not apply powder or lotion to the site. Activity  For 24 hours after the procedure, or as directed by your health care provider: ? Do not flex or bend the affected arm. ? Do not push or pull  heavy objects with the affected arm. ? Do not drive yourself home from the hospital or clinic. You may drive 24 hours after the procedure unless your health care provider tells you not to. ? Do not operate machinery or power tools.  Do not lift anything that is heavier than 10 lb (4.5 kg), or the limit that you are told, until your health care provider says that it is safe.  Ask your health care provider when it is okay to: ? Return to work or school. ? Resume usual physical activities or sports. ? Resume sexual activity.   General instructions  If the catheter site starts to bleed, raise your arm and put firm pressure on the site. If the bleeding does not stop, get help right away. This is a medical emergency.  If you went home on the same day as your procedure, a responsible adult should be with you for the first 24 hours after you arrive home.  Keep all follow-up visits as told by your health care provider. This is important. Contact a health care provider if:  You have a fever.  You have redness, swelling, or yellow drainage around your insertion site. Get help right away if:  You have unusual pain at the radial site.  The catheter insertion area swells very fast.  The insertion area is bleeding, and the bleeding does not stop when you hold steady pressure on the area.  Your arm or hand becomes pale, cool,  tingly, or numb. These symptoms may represent a serious problem that is an emergency. Do not wait to see if the symptoms will go away. Get medical help right away. Call your local emergency services (911 in the [place] Do not drive yourself to the hospital. Summary  After the procedure, it is common to have bruising and tenderness at the site.  Follow instructions from your health care provider about how to take care of your radial site wound. Check the wound every day for signs of infection.  Do not lift anything that is heavier than 10 lb (4.5 kg), or the limit that you  are told, until your health care provider says that it is safe. This information is not intended to replace advice given to you by your health care provider. Make sure you discuss any questions you have with your health care provider. Document Revised: 01/02/2018 Document Reviewed: 01/02/2018 Elsevier Patient Education  2021 [place]. Cerebral Angiogram, Care After This sheet gives you information about how to care for yourself after your procedure. Your health care provider may also give you more specific instructions. If you have problems or questions, contact your health care provider. What can I expect after the procedure? After the procedure, it is common to have:  Bruising and tenderness at the catheter insertion site.  A mild headache. Follow these instructions at home: Insertion site care  Follow instructions from your health care provider about how to take care of the insertion site. Make sure you: ? Wash your hands with soap and water before and after you change your bandage (dressing). If soap and water are not available, use hand sanitizer. ? Change your dressing as told by your health care provider.  Do not take baths, swim, or use a hot tub until your health care provider approves. You may shower 24-48 hours after the procedure, or as told by your health care provider.  To clean your insertion site: ? Gently wash the site with plain soap and water. ? Pat the area dry with a clean towel. ? Do not rub the site. This may cause bleeding.  Do not apply powder or lotion to the site. Keep the site clean and dry. Infection signs Check your incision area every day for signs of infection. Check for:  Redness, swelling, or pain.  Fluid or blood.  Warmth.  Pus or a bad smell.   Activity  Do not drive for 24 hours if you were given a sedative during your procedure.  Rest as told by your health care provider.  Do not lift anything that is heavier than 10 lb (4.5 kg),  or the limit that you are told, until your health care provider says that it is safe.  Return to your normal activities as told by your health care provider, usually in about a week. Ask your health care provider what activities are safe for you. General instructions  If your insertion site starts to bleed, lie flat and put pressure on the site. If the bleeding does not stop, get help right away. This is a medical emergency.  Do not use any products that contain nicotine or tobacco, such as cigarettes, e-cigarettes, and chewing tobacco. If you need help quitting, ask your health care provider.  Take over-the-counter and prescription medicines only as told by your health care provider.  Drink enough fluid to keep your urine pale yellow. This helps flush the contrast dye from your body.  Keep all follow-up visits as directed by  your health care provider. This is important.   Contact a health care provider if:  You have a fever or chills.  You have redness, swelling, or pain around your insertion site.  You have fluid or blood coming from your insertion site.  The insertion site feels warm to the touch.  You have pus or a bad smell coming from your insertion site.  You notice blood collecting in the tissue around the insertion site (hematoma). The hematoma may be painful to the touch. Get help right away if:  You have chest pain or trouble breathing.  You have severe pain or swelling at the insertion site.  The insertion area bleeds, and bleeding continues after 30 minutes of holding steady pressure on the site.  The arm or leg where the catheter was inserted is pale, cold, numb, tingling, or weak.  You have a rash.  You have any symptoms of a stroke. "BE FAST" is an easy way to remember the main warning signs of a stroke: ? B - Balance. Signs are dizziness, sudden trouble walking, or loss of balance. ? E - Eyes. Signs are trouble seeing or a sudden change in vision. ? F -  Face. Signs are sudden weakness or numbness of the face, or the face or eyelid drooping on one side. ? A - Arms. Signs are weakness or numbness in an arm. This happens suddenly and usually on one side of the body. ? S - Speech. Signs are sudden trouble speaking, slurred speech, or trouble understanding what people say. ? T - Time. Time to call emergency services. Write down what time symptoms started.  You have other signs of a stroke, such as: ? A sudden, severe headache with no known cause. ? Nausea or vomiting. ? Seizure. These symptoms may represent a serious problem that is an emergency. Do not wait to see if the symptoms will go away. Get medical help right away. Call your local emergency services (911 in the [place] Do not drive yourself to the hospital. Summary  Bruising and tenderness at the insertion site are common.  Follow your health care provider's instructions about caring for your insertion site. Change dressing and clean the area as instructed.  If your insertion site bleeds, apply direct pressure until bleeding stops.  Return to your normal activities as told by your health care provider. Ask what activities are safe.  Rest and drink plenty of fluids. This information is not intended to replace advice given to you by your health care provider. Make sure you discuss any questions you have with your health care provider. Document Revised: 06/17/2019 Document Reviewed: 06/17/2019 Elsevier Patient Education  2021 [place]. Moderate Conscious Sedation, Adult Sedation is the use of medicines to promote relaxation and to relieve discomfort and anxiety. Moderate conscious sedation is a type of sedation. Under moderate conscious sedation, you are less alert than normal, but you are still able to respond to instructions, touch, or both. Moderate conscious sedation is used during short medical and dental procedures. It is milder than deep sedation, which is a type of sedation  under which you cannot be easily woken up. It is also milder than general anesthesia, which is the use of medicines to make you unconscious. Moderate conscious sedation allows you to return to your regular activities sooner. Tell a health care provider about:  Any allergies you have.  All medicines you are taking, including vitamins, herbs, eye drops, creams, and over-the-counter medicines.  Any use of steroids.  This includes steroids taken by mouth or as a cream.  Any problems you or family members have had with sedatives and anesthetic medicines.  Any blood disorders you have.  Any surgeries you have had.  Any medical conditions you have, such as sleep apnea.  Whether you are pregnant or may be pregnant.  Any use of cigarettes, alcohol, marijuana, or drugs. What are the risks? Generally, this is a safe procedure. However, problems may occur, including:  Getting too much medicine (oversedation).  Nausea.  Allergic reaction to medicines.  Trouble breathing. If this happens, a breathing tube may be used. It will be removed when you are awake and breathing on your own.  Heart trouble.  Lung trouble.  Confusion that gets better with time (emergence delirium). What happens before the procedure? Staying hydrated Follow instructions from your health care provider about hydration, which may include:  Up to 2 hours before the procedure - you may continue to drink clear liquids, such as water, clear fruit juice, black coffee, and plain tea. Eating and drinking restrictions Follow instructions from your health care provider about eating and drinking, which may include:  8 hours before the procedure - stop eating heavy meals or foods, such as meat, fried foods, or fatty foods.  6 hours before the procedure - stop eating light meals or foods, such as toast or cereal.  6 hours before the procedure - stop drinking milk or drinks that contain milk.  2 hours before the procedure -  stop drinking clear liquids. Medicines Ask your health care provider about:  Changing or stopping your regular medicines. This is especially important if you are taking diabetes medicines or blood thinners.  Taking medicines such as aspirin and ibuprofen. These medicines can thin your blood. Do not take these medicines unless your health care provider tells you to take them.  Taking over-the-counter medicines, vitamins, herbs, and supplements. Tests and exams  You will have a physical exam.  You may have blood tests done to show how well: ? Your kidneys and liver work. ? Your blood clots. General instructions  Plan to have a responsible adult take you home from the hospital or clinic.  If you will be going home right after the procedure, plan to have a responsible adult care for you for the time you are told. This is important. What happens during the procedure?  You will be given the sedative. The sedative may be given: ? As a pill that you will swallow. It can also be inserted into the rectum. ? As a spray through the nose. ? As an injection into the muscle. ? As an injection into the vein through an IV.  You may be given oxygen as needed.  Your breathing, heart rate, and blood pressure will be monitored during the procedure.  The medical or dental procedure will be done. The procedure may vary among health care providers and hospitals.   What happens after the procedure?  Your blood pressure, heart rate, breathing rate, and blood oxygen level will be monitored until you leave the hospital or clinic.  You will get fluids through your IV if needed.  Do not drive or operate machinery until your health care provider says that it is safe. Summary  Sedation is the use of medicines to promote relaxation and to relieve discomfort and anxiety. Moderate conscious sedation is a type of sedation that is used during short medical and dental procedures.  Tell the health care  provider about any  medical conditions that you have and about all the medicines that you are taking.  You will be given the sedative as a pill, a spray through the nose, an injection into the muscle, or an injection into the vein through an IV. Vital signs are monitored during the sedation.  Moderate conscious sedation allows you to return to your regular activities sooner. This information is not intended to replace advice given to you by your health care provider. Make sure you discuss any questions you have with your health care provider. Document Revised: 03/26/2020 Document Reviewed: 10/23/2019 Elsevier Patient Education  2021 [employer].

## 2021-04-14 NOTE — Sedation Documentation (Signed)
Pt transported to Short stay room 19 via stretcher accompanied by RN. Sonia Baller at bedside to receive pt. Handoff completed. Pt awake alert and oriented. Right radial site level 0 without any bleeding present. No s/s of distress at this time.

## 2021-04-20 ENCOUNTER — Other Ambulatory Visit (HOSPITAL_COMMUNITY): Payer: Self-pay | Admitting: Interventional Radiology

## 2021-04-20 DIAGNOSIS — Q282 Arteriovenous malformation of cerebral vessels: Secondary | ICD-10-CM

## 2021-04-25 ENCOUNTER — Other Ambulatory Visit: Payer: Self-pay

## 2021-04-25 ENCOUNTER — Ambulatory Visit
Admission: RE | Admit: 2021-04-25 | Discharge: 2021-04-25 | Disposition: A | Discharge status: Home / Self Care | Payer: 59 | Source: Ambulatory Visit | Attending: Student in an Organized Health Care Education/Training Program | Admitting: Student in an Organized Health Care Education/Training Program

## 2021-04-25 DIAGNOSIS — Z122 Encounter for screening for malignant neoplasm of respiratory organs: Secondary | ICD-10-CM

## 2021-05-02 ENCOUNTER — Ambulatory Visit (HOSPITAL_COMMUNITY): Admission: RE | Admit: 2021-05-02 | Payer: 59 | Source: Ambulatory Visit

## 2021-05-02 ENCOUNTER — Telehealth (HOSPITAL_COMMUNITY): Payer: Self-pay

## 2021-05-02 NOTE — Telephone Encounter (Signed)
Called to reschedule consult, no answer, left vm. AW  

## 2021-05-10 ENCOUNTER — Ambulatory Visit (HOSPITAL_COMMUNITY): Admission: RE | Admit: 2021-05-10 | Payer: 59 | Source: Ambulatory Visit

## 2021-05-10 ENCOUNTER — Telehealth (HOSPITAL_COMMUNITY): Payer: Self-pay

## 2021-05-10 NOTE — Telephone Encounter (Signed)
Called to move consult up, no answer, left vm. AW

## 2021-05-19 ENCOUNTER — Ambulatory Visit (HOSPITAL_COMMUNITY): Payer: 59

## 2021-05-25 ENCOUNTER — Other Ambulatory Visit: Payer: Self-pay | Admitting: Internal Medicine

## 2021-05-25 ENCOUNTER — Encounter: Payer: Self-pay | Admitting: Internal Medicine

## 2021-05-25 DIAGNOSIS — J449 Chronic obstructive pulmonary disease, unspecified: Secondary | ICD-10-CM

## 2021-05-25 NOTE — Telephone Encounter (Signed)
  fluticasone furoate-vilanterol (BREO ELLIPTA) 200-25 MCG/INH AEPB, refill request. Pt's wife states the pt get this medication through medication assist.

## 2021-05-27 ENCOUNTER — Ambulatory Visit (HOSPITAL_COMMUNITY): Admission: RE | Admit: 2021-05-27 | Payer: 59 | Source: Ambulatory Visit

## 2021-05-27 ENCOUNTER — Telehealth: Payer: Self-pay

## 2021-05-27 ENCOUNTER — Encounter (HOSPITAL_COMMUNITY): Payer: Self-pay

## 2021-05-27 NOTE — Telephone Encounter (Signed)
Marcelle Smiling,  Ms Brumett called and was asking questions about the patient assistance program for the patient's Breo.  I informed her you were working on it, but she wanted to know how long it would take to get in. He is out of the medication. She is asking if you can call Mr. Gingras and give him an update.  I wasn't sure if we sample this or you have a copay card of something? Thank you for your help, Ranisha Allaire

## 2021-05-30 ENCOUNTER — Other Ambulatory Visit: Payer: Self-pay | Admitting: Student

## 2021-05-30 ENCOUNTER — Encounter: Payer: Self-pay | Admitting: Internal Medicine

## 2021-05-30 DIAGNOSIS — J449 Chronic obstructive pulmonary disease, unspecified: Secondary | ICD-10-CM

## 2021-05-30 MED ORDER — FLUTICASONE FUROATE-VILANTEROL 200-25 MCG/INH IN AEPB: 1.0000 | INHALATION_SPRAY | Freq: Every day | RESPIRATORY_TRACT | 2 refills | Status: DC

## 2021-05-30 NOTE — Telephone Encounter (Signed)
Requesting to speak with a nurse about getting sample on Breo. Please call back.

## 2021-05-30 NOTE — Telephone Encounter (Signed)
The only option that I believe we have would be to try to send in for Derek Patterson since he is already on LABA/ICS + LAMA. There is a co-pay card for that medication. I am not sure the cost though until it is sent in and the coupon can be run. Otherwise Derek Patterson spoke to patient already and patient was going to wait to receive Breo due to being unable to afford price at pharmacy.

## 2021-05-30 NOTE — Telephone Encounter (Signed)
Patient is in the process of reapplying for patient assistance, but is out of Breo (which we do not sample).  Will send to pharmacy team to see if they have a coupon or can substitute until patient receives his Breo.  If you could please call the patient and let him know (see message below). Thank you, SChaplin, RN,BSN

## 2021-05-30 NOTE — Telephone Encounter (Signed)
Thanks Dr. Georgina Peer!  His wife (EC) called again this afternoon about the Breo so I called her back and told her Rosendo Gros was working on this.  She told me his current patient assistance doesn't end until next year.  Rosendo Gros, do you mind checking into this? Thank you, Joven Mom

## 2021-06-14 ENCOUNTER — Encounter: Payer: Self-pay | Admitting: *Deleted

## 2021-07-14 ENCOUNTER — Other Ambulatory Visit: Payer: Self-pay | Admitting: Student

## 2021-07-14 DIAGNOSIS — J449 Chronic obstructive pulmonary disease, unspecified: Secondary | ICD-10-CM

## 2021-08-25 ENCOUNTER — Other Ambulatory Visit: Payer: Self-pay | Admitting: Student

## 2021-08-25 ENCOUNTER — Other Ambulatory Visit: Payer: Self-pay | Admitting: Internal Medicine

## 2021-08-25 DIAGNOSIS — J449 Chronic obstructive pulmonary disease, unspecified: Secondary | ICD-10-CM

## 2021-08-30 ENCOUNTER — Ambulatory Visit (INDEPENDENT_AMBULATORY_CARE_PROVIDER_SITE_OTHER): Payer: 59 | Admitting: Internal Medicine

## 2021-08-30 ENCOUNTER — Encounter: Payer: Self-pay | Admitting: Internal Medicine

## 2021-08-30 DIAGNOSIS — M26609 Unspecified temporomandibular joint disorder, unspecified side: Secondary | ICD-10-CM | POA: Diagnosis not present

## 2021-08-30 DIAGNOSIS — R634 Abnormal weight loss: Secondary | ICD-10-CM | POA: Diagnosis not present

## 2021-08-30 NOTE — Assessment & Plan Note (Addendum)
Patient reporting history of weight loss, decreased appetite, nightsweats for several months. Hx tobacco use. Recent 04/2021 low dose chest CT negative.  Given recent imaging, do not feel as though patient would benefit from additional imaging at this time. Will continue to monitor with yearly CT scans.

## 2021-08-30 NOTE — Patient Instructions (Addendum)
Dear Mr. Derek Patterson,  Today we discussed your ear pain.  We evaluated your ear and it is not an ear infection.  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  We recommend some jaw exercises to help strengthen and realign the jaw to prevent future popping and pain.  We also advised some over-the-counter medications such as ibuprofen to help as well.
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
## 2021-10-13 ENCOUNTER — Other Ambulatory Visit: Payer: Self-pay | Admitting: Internal Medicine

## 2021-10-13 DIAGNOSIS — J449 Chronic obstructive pulmonary disease, unspecified: Secondary | ICD-10-CM

## 2021-11-28 ENCOUNTER — Other Ambulatory Visit: Payer: Self-pay | Admitting: Internal Medicine

## 2021-11-28 ENCOUNTER — Telehealth: Payer: Self-pay

## 2021-11-28 DIAGNOSIS — J449 Chronic obstructive pulmonary disease, unspecified: Secondary | ICD-10-CM

## 2021-11-28 MED ORDER — FLUTICASONE FUROATE-VILANTEROL 200-25 MCG/ACT IN AEPB: 1.0000 | INHALATION_SPRAY | Freq: Every day | RESPIRATORY_TRACT | 2 refills | Status: DC

## 2021-11-28 NOTE — Telephone Encounter (Signed)
Rx for Derek Patterson 200-52mcg sent to [facility] pharmacy.

## 2021-12-08 ENCOUNTER — Telehealth: Payer: Self-pay

## 2021-12-08 NOTE — Telephone Encounter (Signed)
Spoke with pt regarding refills on Breo medication.   Informed pt this is a patient assistance medication from [employer] and they have to call the company themselves for refills [phone]). Pt's wife said this is what they've been doing however the medication is out of refills. Informed pt's wife I will get the refills completed by PCP and fax to [facility] since this medication doesn't get called into the pharmacy. Also gave pt's wife my direct line for future patient assistance inquiries. Pt and wife expressed understanding.

## 2021-12-19 ENCOUNTER — Other Ambulatory Visit: Payer: Self-pay | Admitting: Student

## 2021-12-19 DIAGNOSIS — J449 Chronic obstructive pulmonary disease, unspecified: Secondary | ICD-10-CM

## 2021-12-19 MED ORDER — FLUTICASONE FUROATE-VILANTEROL 200-25 MCG/ACT IN AEPB: 1.0000 | INHALATION_SPRAY | Freq: Every day | RESPIRATORY_TRACT | 2 refills | Status: DC

## 2021-12-20 ENCOUNTER — Encounter: Payer: Self-pay | Admitting: Internal Medicine

## 2021-12-23 ENCOUNTER — Other Ambulatory Visit (HOSPITAL_COMMUNITY): Payer: Self-pay

## 2022-03-01 ENCOUNTER — Other Ambulatory Visit: Payer: Self-pay | Admitting: Internal Medicine

## 2022-03-01 NOTE — Telephone Encounter (Signed)
Next appt scheduled 03/13/22 with PCP. ?

## 2022-03-01 NOTE — Progress Notes (Signed)
Received notification from [employer] regarding approval for BREO 200MCG. Patient assistance approved from 02/21/22 to  ?02/22/23. ? ?MEDICATION SHIPS TO PT'S HOME. PT IS RESPONSIBLE FOR REFILLS. CAN BE CALLED TO COMPANY OR DONE ON AUTOMATED SYSTEM ? ?Phone: [phone] ? ?

## 2022-03-13 ENCOUNTER — Ambulatory Visit (INDEPENDENT_AMBULATORY_CARE_PROVIDER_SITE_OTHER): Payer: 59 | Admitting: Internal Medicine

## 2022-03-13 VITALS — BP 126/82 | HR 72 | Temp 97.7°F | Ht 71.0 in | Wt 143.7 lb

## 2022-03-13 DIAGNOSIS — R911 Solitary pulmonary nodule: Secondary | ICD-10-CM

## 2022-03-13 DIAGNOSIS — Z122 Encounter for screening for malignant neoplasm of respiratory organs: Secondary | ICD-10-CM | POA: Diagnosis not present

## 2022-03-13 DIAGNOSIS — R61 Generalized hyperhidrosis: Secondary | ICD-10-CM

## 2022-03-13 DIAGNOSIS — R634 Abnormal weight loss: Secondary | ICD-10-CM

## 2022-03-13 DIAGNOSIS — J3489 Other specified disorders of nose and nasal sinuses: Secondary | ICD-10-CM | POA: Diagnosis not present

## 2022-03-13 DIAGNOSIS — I10 Essential (primary) hypertension: Secondary | ICD-10-CM

## 2022-03-13 MED ORDER — FEXOFENADINE HCL 60 MG PO TABS: 60.0000 mg | ORAL_TABLET | Freq: Two times a day (BID) | ORAL | 0 refills | Status: DC

## 2022-03-13 MED ORDER — FLUTICASONE PROPIONATE 50 MCG/ACT NA SUSP: 1.0000 | Freq: Every day | NASAL | 0 refills | Status: DC

## 2022-03-13 NOTE — Assessment & Plan Note (Signed)
BP Readings from Last 3 Encounters:  ?03/13/22 126/82  ?08/30/21 130/71  ?04/14/21 110/76  ? ?Assessment: BP is currently well controlled on HCTZ '25mg'$  daily. Renal function stable on BMP 6 mo ago ? ?Plan: ?Continue HCTZ '25mg'$  daily ?Repeat BMP today ?

## 2022-03-13 NOTE — Patient Instructions (Addendum)
Derek Patterson, ? ?It was a pleasure seeing you in clinic. Today we discussed:  ? ?Sinus problems: ?At this time, please start taking Fexofenadine twice daily and Flonase once daily. If no improvement in symptoms or if you develop any fevers, please contact us.  ? ?Night sweats:  ?I am ordering lab work to evaluate this. I also ordered a CT Chest to follow up on your previous lung nodule. I will call you with any abnormal results.   ? ?If you have any questions or concerns, please call our clinic at [phone] between 9am-5pm and after hours call [phone] and ask for the internal medicine resident on call. If you feel you are having a medical emergency please call 911.  ? ?Thank you, we look forward to helping you remain healthy! ? ? ? ?

## 2022-03-13 NOTE — Assessment & Plan Note (Addendum)
Patient's weight has stabilized at this time due to improved appetite. However, reports ongoing night sweats with 2-3 times weekly being drenched in sweat. He does have a history of tobacco use with most recent low dose CT scan having 2.30m nodule. Most recent colonoscopy in 2021 with sessile polyp; recommended for repeat colonoscopy in 7 years.  ?No known family history of prostate cancer and patient denies any urinary symptoms. Previously HIV negative.  ? ?Plan: ?CBC, CMP, HIV, TSH, UA ?Repeat CT Chest f/u lung nodule ?If persistent, can consider CT Abdomen/Pelvis for further evaluation ? ? ?ADDENDUM: ?Mild hypercalcemia on CMP. Otherwise, unremarkable work up. Will repeat CMP at next visit.  ?Patient to schedule CT Chest to follow up on lung nodule ?If persistent, will obtain further imaging with CT Abd/Pelv ?

## 2022-03-13 NOTE — Assessment & Plan Note (Signed)
Patient is presenting for evaluation of sinus pain, specially on the left side for several weeks duration. He has been taking Sudafed for this and has had some relief. However, over the past 3-4 days, notes worsening symptoms. Reports similar episodes since 1996 with seasonal changes. Most recent episode in 2020 that was treated with short course of Augmentin with improvement in symptoms. Patient has had some associated left sided headaches; however, denies any rhinorrhea. Denies any fevers. Examination does reveal tenderness along the left maxillary sinus. Symptoms seem consistent with allergic rhinitis at this time; less likely viral or bacterial sinusitis.  ? ?Plan:  ?Fexofendadine '60mg'$  daily + Flonase once daily  ?Encouraged for sinus irrigation  ?
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
## 2022-03-14 LAB — CMP14 + ANION GAP
ALT: 25 IU/L (ref 0–44)
AST: 27 IU/L (ref 0–40)
Albumin/Globulin Ratio: 2 (ref 1.2–2.2)
Albumin: 4.7 g/dL (ref 3.8–4.8)
Alkaline Phosphatase: 104 IU/L (ref 44–121)
Anion Gap: 15 mmol/L (ref 10.0–18.0)
BUN/Creatinine Ratio: 16 (ref 10–24)
BUN: 18 mg/dL (ref 8–27)
Bilirubin Total: 0.5 mg/dL (ref 0.0–1.2)
CO2: 26 mmol/L (ref 20–29)
Calcium: 10.3 mg/dL — ABNORMAL HIGH (ref 8.6–10.2)
Chloride: 102 mmol/L (ref 96–106)
Creatinine, Ser: 1.15 mg/dL (ref 0.76–1.27)
Globulin, Total: 2.3 g/dL (ref 1.5–4.5)
Glucose: 69 mg/dL — ABNORMAL LOW (ref 70–99)
Potassium: 4.8 mmol/L (ref 3.5–5.2)
Sodium: 143 mmol/L (ref 134–144)
Total Protein: 7 g/dL (ref 6.0–8.5)
eGFR: 71 mL/min/{1.73_m2} (ref >59)

## 2022-03-14 LAB — HIV ANTIBODY (ROUTINE TESTING W REFLEX): HIV Screen 4th Generation wRfx: NONREACTIVE

## 2022-03-14 LAB — URINALYSIS, ROUTINE W REFLEX MICROSCOPIC
Bilirubin, UA: NEGATIVE
Glucose, UA: NEGATIVE
Ketones, UA: NEGATIVE
Leukocytes,UA: NEGATIVE
Nitrite, UA: NEGATIVE
Protein,UA: NEGATIVE
RBC, UA: NEGATIVE
Specific Gravity, UA: 1.016 (ref 1.005–1.030)
Urobilinogen, Ur: 0.2 mg/dL (ref 0.2–1.0)
pH, UA: 8.5 — ABNORMAL HIGH (ref 5.0–7.5)

## 2022-03-14 LAB — CBC
Hematocrit: 47.5 % (ref 37.5–51.0)
Hemoglobin: 16.4 g/dL (ref 13.0–17.7)
MCH: 28 pg (ref 26.6–33.0)
MCHC: 34.5 g/dL (ref 31.5–35.7)
MCV: 81 fL (ref 79–97)
Platelets: 314 10*3/uL (ref 150–450)
RBC: 5.86 x10E6/uL — ABNORMAL HIGH (ref 4.14–5.80)
RDW: 13.5 % (ref 11.6–15.4)
WBC: 3.9 10*3/uL (ref 3.4–10.8)

## 2022-03-14 LAB — TSH: TSH: 1.38 u[IU]/mL (ref 0.450–4.500)

## 2022-03-16 NOTE — Progress Notes (Signed)
Internal Medicine Clinic Attending ° °Case discussed with Dr. Aslam  At the time of the visit.  We reviewed the resident’s history and exam and pertinent patient test results.  I agree with the assessment, diagnosis, and plan of care documented in the resident’s note.  °

## 2022-03-27 ENCOUNTER — Ambulatory Visit (INDEPENDENT_AMBULATORY_CARE_PROVIDER_SITE_OTHER): Payer: 59 | Admitting: Internal Medicine

## 2022-03-27 DIAGNOSIS — J3489 Other specified disorders of nose and nasal sinuses: Secondary | ICD-10-CM | POA: Diagnosis not present

## 2022-03-27 MED ORDER — PSEUDOEPHEDRINE HCL ER 120 MG PO TB12: 120.0000 mg | ORAL_TABLET | Freq: Two times a day (BID) | ORAL | 0 refills | Status: AC
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
## 2022-03-27 NOTE — Addendum Note (Signed)
Addended by: Randalyn Rhea on: 03/27/2022 02:05 PM ? ? Modules accepted: Level of Service ? ?

## 2022-03-27 NOTE — Assessment & Plan Note (Addendum)
Patient presented as a telehealth visit today regarding his sinus pain, of which he was previously seen at the [facility] for on 4/3. At that time, the patient was endorsing left sided sinus/nasal pain and occasional left sided headaches, with some relief with the use of Sudafed. He denied any fevers, chills, or rhinorrhea at that time and was diagnosed with allergic rhinitis. He was given allegra 60 mg daily and flonase to use daily. Today, the patient states that he has been using his Flonase daily, as well as the allegra. He believes that the Flonase is helping some, but has not gotten any relief from the allergra. He continues to have left sided sinus pain and occassional left sided headaches without any fevers ,chills, diaphoresis, rhinorrhea, cough ,or sore throat. When asked what has helped him during previous flares, he notes that Sudafed usually works well. When the patient was previously taking this, he was not taking it consistently, thus, we will re-trial Sudafed today. Counseled the patient that Sudafed could raise his blood pressure, however, with a short course, he should be ok.  ? ?If patient does not improve after trialing scheduled sudafed or if he develops fevers/chills or pain when brushing his teeth, could consider a short course of Augmentin, however, at this time, I do not believe he has bacterial sinusitis. ? ?Plan: ?- Sudafed 120 mg bid x 7 days ?- Continue daily flonase ?- Encouraged for sinus irrigation  ? ?

## 2022-03-27 NOTE — Progress Notes (Signed)
Internal Medicine Clinic Attending ° °Case discussed with Dr. Atway  At the time of the visit.  We reviewed the resident’s history and exam and pertinent patient test results.  I agree with the assessment, diagnosis, and plan of care documented in the resident’s note.  °

## 2022-04-10 ENCOUNTER — Encounter: Payer: Self-pay | Admitting: Internal Medicine

## 2022-04-10 DIAGNOSIS — J449 Chronic obstructive pulmonary disease, unspecified: Secondary | ICD-10-CM

## 2022-04-10 MED ORDER — FLUTICASONE FUROATE-VILANTEROL 200-25 MCG/ACT IN AEPB: 1.0000 | INHALATION_SPRAY | Freq: Every day | RESPIRATORY_TRACT | 3 refills | Status: DC

## 2022-05-09 ENCOUNTER — Ambulatory Visit (HOSPITAL_COMMUNITY): Admission: RE | Admit: 2022-05-09 | Payer: 59 | Source: Ambulatory Visit

## 2022-06-05 ENCOUNTER — Other Ambulatory Visit: Payer: Self-pay | Admitting: Internal Medicine

## 2022-06-13 IMAGING — DX DG CHEST 1V PORT
1 series · 1 of 1 positions shown · non-contrast
Comparison: Chest radiograph dated 12/02/2019.

CLINICAL DATA: 64-year-old male with shortness of breath.

EXAM:
PORTABLE CHEST 1 VIEW

[chest]
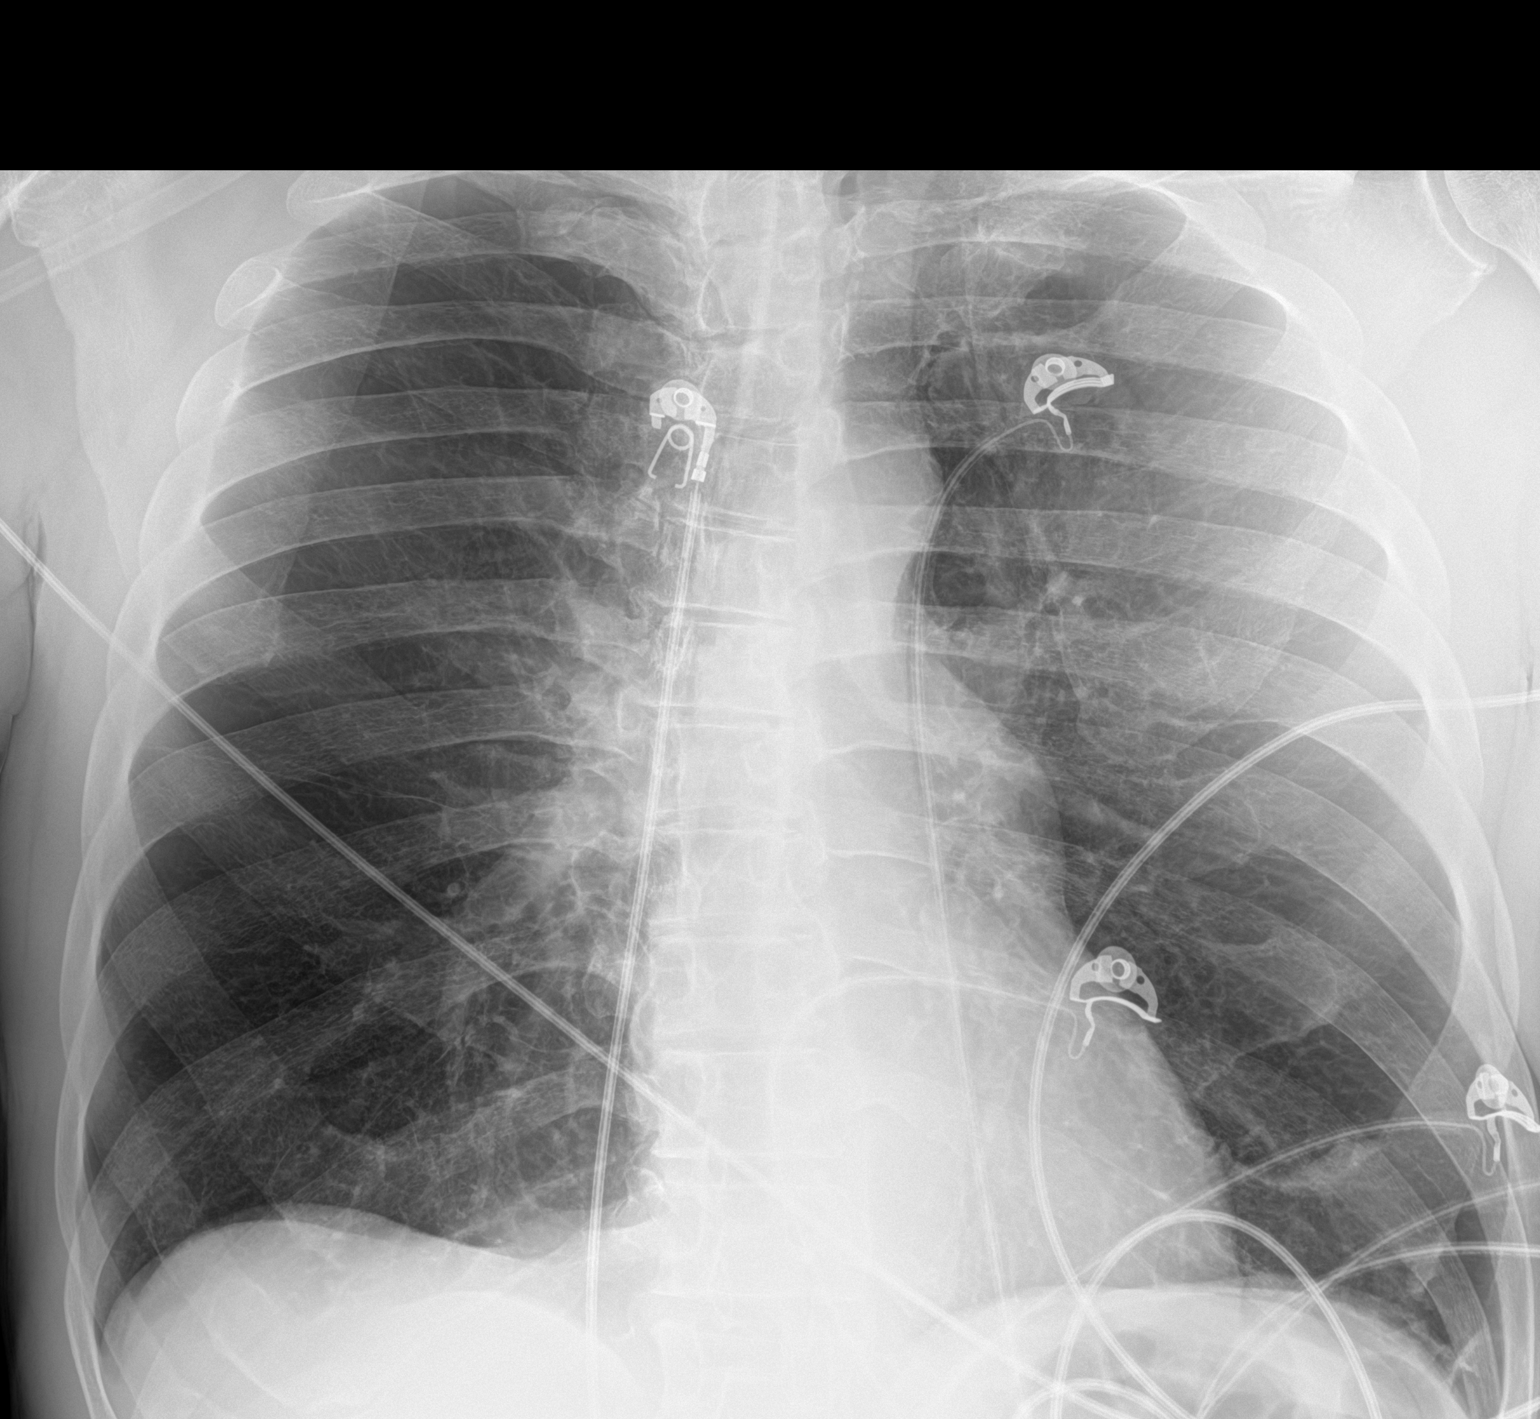

[1 of 1 positions shown; findings below may reference images not displayed]

FINDINGS: Background of emphysema. No focal consolidation, pleural effusion or
pneumothorax. The cardiac silhouette is within limits. No acute
osseous pathology.
IMPRESSION: No active disease.

## 2022-06-19 ENCOUNTER — Other Ambulatory Visit: Payer: Self-pay | Admitting: Internal Medicine

## 2022-06-19 DIAGNOSIS — J449 Chronic obstructive pulmonary disease, unspecified: Secondary | ICD-10-CM

## 2022-07-18 ENCOUNTER — Emergency Department (HOSPITAL_COMMUNITY): Payer: 59

## 2022-07-18 ENCOUNTER — Observation Stay (HOSPITAL_COMMUNITY)
Admission: EM | Admit: 2022-07-18 | Discharge: 2022-07-20 | Disposition: A | Discharge status: Home / Self Care | Payer: 59 | Attending: Internal Medicine | Admitting: Internal Medicine

## 2022-07-18 DIAGNOSIS — Z79899 Other long term (current) drug therapy: Secondary | ICD-10-CM | POA: Diagnosis not present

## 2022-07-18 DIAGNOSIS — J441 Chronic obstructive pulmonary disease with (acute) exacerbation: Secondary | ICD-10-CM | POA: Diagnosis not present

## 2022-07-18 DIAGNOSIS — Z7982 Long term (current) use of aspirin: Secondary | ICD-10-CM | POA: Insufficient documentation

## 2022-07-18 DIAGNOSIS — N182 Chronic kidney disease, stage 2 (mild): Secondary | ICD-10-CM | POA: Insufficient documentation

## 2022-07-18 DIAGNOSIS — I129 Hypertensive chronic kidney disease with stage 1 through stage 4 chronic kidney disease, or unspecified chronic kidney disease: Secondary | ICD-10-CM | POA: Insufficient documentation

## 2022-07-18 DIAGNOSIS — J9601 Acute respiratory failure with hypoxia: Secondary | ICD-10-CM | POA: Diagnosis not present

## 2022-07-18 DIAGNOSIS — R0602 Shortness of breath: Secondary | ICD-10-CM | POA: Diagnosis present

## 2022-07-18 DIAGNOSIS — F17210 Nicotine dependence, cigarettes, uncomplicated: Secondary | ICD-10-CM | POA: Insufficient documentation

## 2022-07-18 DIAGNOSIS — Z20822 Contact with and (suspected) exposure to covid-19: Secondary | ICD-10-CM | POA: Insufficient documentation

## 2022-07-18 LAB — CBC WITH DIFFERENTIAL/PLATELET
Abs Immature Granulocytes: 0.01 10*3/uL (ref 0.00–0.07)
Basophils Absolute: 0.1 10*3/uL (ref 0.0–0.1)
Basophils Relative: 1 %
Eosinophils Absolute: 0.1 10*3/uL (ref 0.0–0.5)
Eosinophils Relative: 2 %
HCT: 50.5 % (ref 39.0–52.0)
Hemoglobin: 16.4 g/dL (ref 13.0–17.0)
Immature Granulocytes: 0 %
Lymphocytes Relative: 59 %
Lymphs Abs: 4 10*3/uL (ref 0.7–4.0)
MCH: 27.5 pg (ref 26.0–34.0)
MCHC: 32.5 g/dL (ref 30.0–36.0)
MCV: 84.6 fL (ref 80.0–100.0)
Monocytes Absolute: 0.4 10*3/uL (ref 0.1–1.0)
Monocytes Relative: 7 %
Neutro Abs: 2.1 10*3/uL (ref 1.7–7.7)
Neutrophils Relative %: 31 %
Platelets: 268 10*3/uL (ref 150–400)
RBC: 5.97 MIL/uL — ABNORMAL HIGH (ref 4.22–5.81)
RDW: 13.8 % (ref 11.5–15.5)
WBC: 6.7 10*3/uL (ref 4.0–10.5)
nRBC: 0 % (ref 0.0–0.2)

## 2022-07-18 LAB — I-STAT VENOUS BLOOD GAS, ED
Acid-Base Excess: 1 mmol/L (ref 0.0–2.0)
Bicarbonate: 26.9 mmol/L (ref 20.0–28.0)
Calcium, Ion: 1.09 mmol/L — ABNORMAL LOW (ref 1.15–1.40)
HCT: 51 % (ref 39.0–52.0)
Hemoglobin: 17.3 g/dL — ABNORMAL HIGH (ref 13.0–17.0)
O2 Saturation: 78 %
Potassium: 3.7 mmol/L (ref 3.5–5.1)
Sodium: 136 mmol/L (ref 135–145)
TCO2: 28 mmol/L (ref 22–32)
pCO2, Ven: 46.5 mmHg (ref 44–60)
pH, Ven: 7.371 (ref 7.25–7.43)
pO2, Ven: 44 mmHg (ref 32–45)

## 2022-07-18 LAB — RESP PANEL BY RT-PCR (FLU A&B, COVID) ARPGX2
Influenza A by PCR: NEGATIVE
Influenza B by PCR: NEGATIVE
SARS Coronavirus 2 by RT PCR: NEGATIVE

## 2022-07-18 LAB — BASIC METABOLIC PANEL
Anion gap: 10 (ref 5–15)
BUN: 17 mg/dL (ref 8–23)
CO2: 26 mmol/L (ref 22–32)
Calcium: 9.7 mg/dL (ref 8.9–10.3)
Chloride: 102 mmol/L (ref 98–111)
Creatinine, Ser: 1.18 mg/dL (ref 0.61–1.24)
GFR, Estimated: >60 mL/min (ref >60)
Glucose, Bld: 113 mg/dL — ABNORMAL HIGH (ref 70–99)
Potassium: 3.8 mmol/L (ref 3.5–5.1)
Sodium: 138 mmol/L (ref 135–145)

## 2022-07-18 LAB — BRAIN NATRIURETIC PEPTIDE: B Natriuretic Peptide: 3.9 pg/mL (ref 0.0–100.0)

## 2022-07-18 MED ORDER — ALBUTEROL SULFATE (2.5 MG/3ML) 0.083% IN NEBU
10.0000 mg | INHALATION_SOLUTION | Freq: Once | RESPIRATORY_TRACT | Status: AC
  Administered 2022-07-18: 10 mg via RESPIRATORY_TRACT
  Filled 2022-07-18: qty 12

## 2022-07-18 MED ORDER — DOXYCYCLINE HYCLATE 100 MG IV SOLR
100.0000 mg | Freq: Two times a day (BID) | INTRAVENOUS | Status: DC
Start: 2022-07-18 — End: 2022-07-19
  Administered 2022-07-18: 100 mg via INTRAVENOUS
  Filled 2022-07-18: qty 100

## 2022-07-18 MED ORDER — METHYLPREDNISOLONE SODIUM SUCC 125 MG IJ SOLR
125.0000 mg | Freq: Once | INTRAMUSCULAR | Status: AC
  Administered 2022-07-18: 125 mg via INTRAVENOUS
  Filled 2022-07-18: qty 2

## 2022-07-18 MED ORDER — IPRATROPIUM BROMIDE 0.02 % IN SOLN
0.5000 mg | Freq: Once | RESPIRATORY_TRACT | Status: AC
  Administered 2022-07-18: 0.5 mg via RESPIRATORY_TRACT
  Filled 2022-07-18: qty 2.5

## 2022-07-18 NOTE — ED Notes (Signed)
Respiratory at bedside and patient placed on BiPAP at this time.

## 2022-07-18 NOTE — ED Triage Notes (Signed)
BIB EMS pt coming from home, hx of COPD, and per pt x1 hr increased shob.

## 2022-07-18 NOTE — ED Provider Notes (Addendum)
[facility] EMERGENCY DEPARTMENT Provider Note   CSN: [id number] Arrival date & time: 07/18/22  2055     History  Chief Complaint  Patient presents with   Shortness of Breath    Derek Patterson is a 66 y.o. male.  HPI     66 year old male comes in with chief complaint of shortness of breath Patient has history of COPD, CKD, hyperlipidemia and tobacco use disorder.  Patient states that he woke up this morning feeling well, however as the day progressed he started getting increased wheezing and increased chest tightness.  About an hour ago he decided to give himself a breathing treatment, and his symptoms actually got worse prompting him to come to the ER.   Home Medications Prior to Admission medications   Medication Sig Start Date End Date Taking? Authorizing Provider  albuterol (VENTOLIN HFA) 108 (90 Base) MCG/ACT inhaler INHALE 1-2 PUFFS INTO THE LUNGS EVERY 4 HOURS FOR WHEEZING OR SHORTNESS OF BREATH 06/19/22   Masters, Joellen Jersey, DO  aspirin 81 MG EC tablet Take 81 mg by mouth daily.    [provider]  feeding supplement, ENSURE COMPLETE, (ENSURE COMPLETE) LIQD Take 237 mLs by mouth daily.    [provider]  fexofenadine (ALLEGRA) 60 MG tablet Take 1 tablet (60 mg total) by mouth 2 (two) times daily. 03/13/22 04/12/22  Harvie Heck, MD  fluticasone (FLONASE) 50 MCG/ACT nasal spray Place 1 spray into both nostrils daily. 03/13/22 03/13/23  Harvie Heck, MD  fluticasone furoate-vilanterol (BREO ELLIPTA) 200-25 MCG/ACT AEPB Inhale 1 puff into the lungs daily. 04/10/22 04/05/23  Harvie Heck, MD  hydrochlorothiazide (HYDRODIURIL) 25 MG tablet TAKE 1 TABLET BY MOUTH EVERY DAY 03/01/22   Harvie Heck, MD  Multiple Vitamins-Minerals (CENTRUM SILVER 50+MEN PO) Take 1 tablet by mouth daily.    [provider]  nicotine (NICODERM CQ - DOSED IN MG/24 HR) 7 mg/24hr patch Place 1 patch (7 mg total) onto the skin daily. 12/21/20   Seawell, Jaimie A, DO   tamsulosin (FLOMAX) 0.4 MG CAPS capsule TAKE 1 CAPSULE BY MOUTH EVERY DAY AFTER SUPPER 08/26/21   Harvie Heck, MD  tiotropium (SPIRIVA HANDIHALER) 18 MCG inhalation capsule Place 1 capsule (18 mcg total) into inhaler and inhale daily. 04/07/21 04/07/22  Mosetta Anis, MD      Allergies    Patient has no known allergies.    Review of Systems   Review of Systems  All other systems reviewed and are negative.   Physical Exam Updated Vital Signs BP 105/88   Pulse 67   Temp (!) 97.2 F (36.2 C) (Oral)   Resp (!) 21   Ht '5\' 11"'$  (1.803 m)   Wt 65.2 kg   SpO2 98%   BMI 20.05 kg/m  Physical Exam Vitals and nursing note reviewed.  Constitutional:      General: He is in acute distress.     Appearance: He is well-developed. He is diaphoretic.  HENT:     Head: Atraumatic.  Cardiovascular:     Rate and Rhythm: Normal rate.  Pulmonary:     Effort: Pulmonary effort is normal.     Breath sounds: Decreased breath sounds and wheezing present.  Musculoskeletal:     Cervical back: Neck supple.  Skin:    General: Skin is warm.  Neurological:     Mental Status: He is alert and oriented to person, place, and time.     ED Results / Procedures / Treatments   Labs (all  labs ordered are listed, but only abnormal results are displayed) Labs Reviewed  BASIC METABOLIC PANEL - Abnormal; Notable for the following components:      Result Value   Glucose, Bld 113 (*)    All other components within normal limits  CBC WITH DIFFERENTIAL/PLATELET - Abnormal; Notable for the following components:   RBC 5.97 (*)    All other components within normal limits  I-STAT VENOUS BLOOD GAS, ED - Abnormal; Notable for the following components:   Calcium, Ion 1.09 (*)    Hemoglobin 17.3 (*)    All other components within normal limits  RESP PANEL BY RT-PCR (FLU A&B, COVID) ARPGX2  BRAIN NATRIURETIC PEPTIDE    EKG EKG Interpretation  Date/Time:  Tuesday July 18 2022 20:59:43 EDT Ventricular Rate:   92 PR Interval:  196 QRS Duration: 128 QT Interval:  389 QTC Calculation: 482 R Axis:   151 Text Interpretation: Sinus rhythm Biatrial enlargement Right bundle branch block ST elevation, consider inferior injury No acute changes TWI not new Confirmed by Varney Biles ([id number]) on 07/18/2022 11:25:28 PM  Radiology DG Chest Port 1 View  Result Date: 07/18/2022 CLINICAL DATA:  Shortness of breath EXAM: PORTABLE CHEST 1 VIEW COMPARISON:  12/01/2020 FINDINGS: Two frontal views of the chest demonstrate an unremarkable cardiac silhouette. Background emphysema and hyperinflation without acute airspace disease, effusion, or pneumothorax. No acute bony abnormality. IMPRESSION: 1. Stable emphysema.  No acute process. Electronically Signed   By: Randa Ngo M.D.   On: 07/18/2022 21:44    Procedures .Critical Care  Performed by: Varney Biles, MD Authorized by: Varney Biles, MD   Critical care provider statement:    Critical care time (minutes):  30   Critical care was necessary to treat or prevent imminent or life-threatening deterioration of the following conditions:  Respiratory failure   Critical care was time spent personally by me on the following activities:  Development of treatment plan with patient or surrogate, discussions with consultants, evaluation of patient's response to treatment, examination of patient, ordering and review of laboratory studies, ordering and review of radiographic studies, ordering and performing treatments and interventions, pulse oximetry, re-evaluation of patient's condition and review of old charts     Medications Ordered in ED Medications  doxycycline (VIBRAMYCIN) 100 mg in sodium chloride 0.9 % 250 mL IVPB (100 mg Intravenous New Bag/Given 07/18/22 2315)  albuterol (PROVENTIL) (2.5 MG/3ML) 0.083% nebulizer solution 10 mg (10 mg Nebulization Given 07/18/22 2120)  ipratropium (ATROVENT) nebulizer solution 0.5 mg (0.5 mg Nebulization Given 07/18/22 2120)   methylPREDNISolone sodium succinate (SOLU-MEDROL) 125 mg/2 mL injection 125 mg (125 mg Intravenous Given 07/18/22 2313)    ED Course/ Medical Decision Making/ A&P                           Medical Decision Making Amount and/or Complexity of Data Reviewed Labs: ordered. Radiology: ordered.  Risk Prescription drug management. Decision regarding hospitalization.  This patient presents to the ED with chief complaint(s) of acute shortness of breath, chest tightness with pertinent past medical history of COPD which further complicates the presenting complaint. The complaint involves an extensive differential diagnosis and also carries with it a high risk of complications and morbidity.    The differential diagnosis includes acute COPD exacerbation, pneumothorax, pulmonary edema, pleural effusion, pneumonia, acute coronary syndrome  The initial plan is to basic labs, venous blood gas to make sure there is no significant hypercapnia, chest x-ray and EKG  Additional history obtained: Additional history obtained from EMS  Records reviewed [place] labs interpretation:  The following labs were independently interpreted: Venous blood gases reassuring.  Patient does not have significant leukocytosis  Independent visualization of imaging: - I independently visualized the following imaging with scope of interpretation limited to determining acute life threatening conditions related to emergency care: X-ray of the chest, which revealed no evidence of pneumonia  Treatment and Reassessment: Patient was initially started on 10 mg/h of albuterol.  Thereafter, patient was reassessed.  He has fine expiratory wheezing and has persistent tachypnea, chest tightness.  Still appears uncomfortable.  We will place him on BiPAP  11:27 PM Patient reassessed again.  He has been on BiPAP for at least 30 minutes now.  He is feeling a lot better being on BiPAP.  We will proceed with  admission. IV Solu-Medrol and doxycycline ordered.  Final Clinical Impression(s) / ED Diagnoses Final diagnoses:  COPD exacerbation [facility])    Rx / DC Orders ED Discharge Orders     None         Varney Biles, MD 07/18/22 3383    Varney Biles, MD 07/18/22 2328

## 2022-07-18 NOTE — ED Notes (Signed)
Patient states "I still feel that I am struggling to breathe" Nanavati MD made aware. Respiratory paged.

## 2022-07-19 ENCOUNTER — Observation Stay (HOSPITAL_COMMUNITY): Payer: 59

## 2022-07-19 ENCOUNTER — Other Ambulatory Visit: Payer: Self-pay

## 2022-07-19 ENCOUNTER — Encounter (HOSPITAL_COMMUNITY): Payer: Self-pay | Admitting: Internal Medicine

## 2022-07-19 DIAGNOSIS — J441 Chronic obstructive pulmonary disease with (acute) exacerbation: Secondary | ICD-10-CM | POA: Diagnosis not present

## 2022-07-19 DIAGNOSIS — J9601 Acute respiratory failure with hypoxia: Secondary | ICD-10-CM | POA: Diagnosis not present

## 2022-07-19 DIAGNOSIS — F17210 Nicotine dependence, cigarettes, uncomplicated: Secondary | ICD-10-CM

## 2022-07-19 LAB — I-STAT ARTERIAL BLOOD GAS, ED
Acid-Base Excess: 2 mmol/L (ref 0.0–2.0)
Bicarbonate: 27.4 mmol/L (ref 20.0–28.0)
Calcium, Ion: 1.22 mmol/L (ref 1.15–1.40)
HCT: 43 % (ref 39.0–52.0)
Hemoglobin: 14.6 g/dL (ref 13.0–17.0)
O2 Saturation: 98 %
Patient temperature: 98.6
Potassium: 3.5 mmol/L (ref 3.5–5.1)
Sodium: 136 mmol/L (ref 135–145)
TCO2: 29 mmol/L (ref 22–32)
pCO2 arterial: 45.5 mmHg (ref 32–48)
pH, Arterial: 7.387 (ref 7.35–7.45)
pO2, Arterial: 101 mmHg (ref 83–108)

## 2022-07-19 LAB — CBC
HCT: 44.4 % (ref 39.0–52.0)
Hemoglobin: 14.7 g/dL (ref 13.0–17.0)
MCH: 27.6 pg (ref 26.0–34.0)
MCHC: 33.1 g/dL (ref 30.0–36.0)
MCV: 83.5 fL (ref 80.0–100.0)
Platelets: 243 10*3/uL (ref 150–400)
RBC: 5.32 MIL/uL (ref 4.22–5.81)
RDW: 13.8 % (ref 11.5–15.5)
WBC: 6.2 10*3/uL (ref 4.0–10.5)
nRBC: 0 % (ref 0.0–0.2)

## 2022-07-19 LAB — COMPREHENSIVE METABOLIC PANEL
ALT: 25 U/L (ref 0–44)
AST: 31 U/L (ref 15–41)
Albumin: 3.8 g/dL (ref 3.5–5.0)
Alkaline Phosphatase: 78 U/L (ref 38–126)
Anion gap: 12 (ref 5–15)
BUN: 16 mg/dL (ref 8–23)
CO2: 24 mmol/L (ref 22–32)
Calcium: 9.4 mg/dL (ref 8.9–10.3)
Chloride: 102 mmol/L (ref 98–111)
Creatinine, Ser: 1.03 mg/dL (ref 0.61–1.24)
GFR, Estimated: >60 mL/min (ref >60)
Glucose, Bld: 141 mg/dL — ABNORMAL HIGH (ref 70–99)
Potassium: 3.9 mmol/L (ref 3.5–5.1)
Sodium: 138 mmol/L (ref 135–145)
Total Bilirubin: 0.8 mg/dL (ref 0.3–1.2)
Total Protein: 6.9 g/dL (ref 6.5–8.1)

## 2022-07-19 LAB — D-DIMER, QUANTITATIVE: D-Dimer, Quant: <0.27 ug/mL-FEU (ref 0.00–0.50)

## 2022-07-19 LAB — TROPONIN I (HIGH SENSITIVITY): Troponin I (High Sensitivity): 6 ng/L (ref <18)

## 2022-07-19 MED ORDER — FLUTICASONE PROPIONATE 50 MCG/ACT NA SUSP
1.0000 | Freq: Every day | NASAL | Status: DC
Start: 2022-07-19 — End: 2022-07-20
  Filled 2022-07-19: qty 16

## 2022-07-19 MED ORDER — ONDANSETRON HCL 4 MG PO TABS: 4.0000 mg | ORAL_TABLET | Freq: Four times a day (QID) | ORAL | Status: DC | PRN

## 2022-07-19 MED ORDER — ARFORMOTEROL TARTRATE 15 MCG/2ML IN NEBU
15.0000 ug | INHALATION_SOLUTION | Freq: Two times a day (BID) | RESPIRATORY_TRACT | Status: DC
Start: 2022-07-19 — End: 2022-07-20
  Administered 2022-07-19 – 2022-07-20 (×3): 15 ug via RESPIRATORY_TRACT
  Filled 2022-07-19 (×3): qty 2

## 2022-07-19 MED ORDER — AZITHROMYCIN 500 MG PO TABS
250.0000 mg | ORAL_TABLET | Freq: Every day | ORAL | Status: DC
  Administered 2022-07-19 – 2022-07-20 (×2): 250 mg via ORAL
  Filled 2022-07-19 (×4): qty 1

## 2022-07-19 MED ORDER — ACETAMINOPHEN 650 MG RE SUPP: 650.0000 mg | Freq: Four times a day (QID) | RECTAL | Status: DC | PRN

## 2022-07-19 MED ORDER — ONDANSETRON HCL 4 MG/2ML IJ SOLN: 4.0000 mg | Freq: Four times a day (QID) | INTRAMUSCULAR | Status: DC | PRN

## 2022-07-19 MED ORDER — AZITHROMYCIN 250 MG PO TABS
250.0000 mg | ORAL_TABLET | Freq: Two times a day (BID) | ORAL | Status: DC
Start: 2022-07-19 — End: 2022-07-19

## 2022-07-19 MED ORDER — ALBUTEROL SULFATE (2.5 MG/3ML) 0.083% IN NEBU: 2.5000 mg | INHALATION_SOLUTION | RESPIRATORY_TRACT | Status: DC | PRN

## 2022-07-19 MED ORDER — REVEFENACIN 175 MCG/3ML IN SOLN
175.0000 ug | Freq: Every day | RESPIRATORY_TRACT | Status: DC
  Administered 2022-07-19 – 2022-07-20 (×2): 175 ug via RESPIRATORY_TRACT
  Filled 2022-07-19 (×4): qty 3

## 2022-07-19 MED ORDER — ENOXAPARIN SODIUM 40 MG/0.4ML IJ SOSY
40.0000 mg | PREFILLED_SYRINGE | Freq: Every day | INTRAMUSCULAR | Status: DC
  Administered 2022-07-19: 40 mg via SUBCUTANEOUS
  Filled 2022-07-19: qty 0.4

## 2022-07-19 MED ORDER — NICOTINE 14 MG/24HR TD PT24
14.0000 mg | MEDICATED_PATCH | Freq: Every day | TRANSDERMAL | Status: DC
  Filled 2022-07-19: qty 1

## 2022-07-19 MED ORDER — UMECLIDINIUM BROMIDE 62.5 MCG/ACT IN AEPB
1.0000 | INHALATION_SPRAY | Freq: Every day | RESPIRATORY_TRACT | Status: DC
Start: 2022-07-19 — End: 2022-07-19
  Filled 2022-07-19: qty 7

## 2022-07-19 MED ORDER — ADULT MULTIVITAMIN W/MINERALS CH
1.0000 | ORAL_TABLET | Freq: Every day | ORAL | Status: DC
  Administered 2022-07-19: 1 via ORAL
  Filled 2022-07-19 (×2): qty 1

## 2022-07-19 MED ORDER — BOOST / RESOURCE BREEZE PO LIQD CUSTOM
237.0000 mL | Freq: Every day | ORAL | Status: DC
  Filled 2022-07-19: qty 1

## 2022-07-19 MED ORDER — TAMSULOSIN HCL 0.4 MG PO CAPS
0.4000 mg | ORAL_CAPSULE | Freq: Every day | ORAL | Status: DC
  Administered 2022-07-19: 0.4 mg via ORAL
  Filled 2022-07-19: qty 1

## 2022-07-19 MED ORDER — ASPIRIN 81 MG PO TBEC
81.0000 mg | DELAYED_RELEASE_TABLET | Freq: Every day | ORAL | Status: DC
  Administered 2022-07-19 – 2022-07-20 (×2): 81 mg via ORAL
  Filled 2022-07-19 (×3): qty 1

## 2022-07-19 MED ORDER — BUDESONIDE 0.25 MG/2ML IN SUSP
0.2500 mg | Freq: Two times a day (BID) | RESPIRATORY_TRACT | Status: DC
  Administered 2022-07-19 – 2022-07-20 (×3): 0.25 mg via RESPIRATORY_TRACT
  Filled 2022-07-19 (×3): qty 2

## 2022-07-19 MED ORDER — FLUTICASONE FUROATE-VILANTEROL 100-25 MCG/ACT IN AEPB
1.0000 | INHALATION_SPRAY | Freq: Every day | RESPIRATORY_TRACT | Status: DC
Start: 2022-07-19 — End: 2022-07-19
  Filled 2022-07-19: qty 28

## 2022-07-19 MED ORDER — IPRATROPIUM-ALBUTEROL 0.5-2.5 (3) MG/3ML IN SOLN
3.0000 mL | RESPIRATORY_TRACT | Status: DC
  Administered 2022-07-19 – 2022-07-20 (×5): 3 mL via RESPIRATORY_TRACT
  Filled 2022-07-19 (×5): qty 3

## 2022-07-19 MED ORDER — ACETAMINOPHEN 325 MG PO TABS: 650.0000 mg | ORAL_TABLET | Freq: Four times a day (QID) | ORAL | Status: DC | PRN

## 2022-07-19 MED ORDER — PREDNISONE 20 MG PO TABS
40.0000 mg | ORAL_TABLET | Freq: Every day | ORAL | Status: DC
  Administered 2022-07-19 – 2022-07-20 (×2): 40 mg via ORAL
  Filled 2022-07-19 (×3): qty 2

## 2022-07-19 NOTE — ED Notes (Signed)
Patient transported to CT 

## 2022-07-19 NOTE — ED Notes (Signed)
Mr. Derek Patterson able to speak in complete sentences with his oxygen saturation staying above 95% but he is complaining of being "exhausted" easily. No retractions or nasal fairing noted.

## 2022-07-19 NOTE — ED Notes (Signed)
C/O chest pain non radiating and oxygen saturation on RA going to the low 80's with dyspnea. EKG done and pt place on oxygen at 2L Attending notified of change.
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
## 2022-07-19 NOTE — ED Notes (Signed)
Mr. Derek Patterson sitting up eating lunch, [name] from CT at bedside . Agreed to go as long as food still hot.

## 2022-07-19 NOTE — ED Notes (Signed)
Patient given banana pudding and fruit cup he had in the nourishment room. Okay per orders.

## 2022-07-19 NOTE — Hospital Course (Addendum)
Derek Patterson is a 66 yo M with a past medical history of hypertension, LUTS on flomax, COPD on breo ellipta who presented to the ED with complaints of acutely worsening dyspnea.   Patient states that on the day of admission he went outside to his man cave and started vacuuming the floors. During this time he started feeling increasingly more dyspneic. He felt that the increased movement along with the dust likely precipitated his symptoms. He sat down and used his albuterol inhaler ~4x to minimal effect. He also tried an additional dose of his breo ellipta which was ineffective. The patient states that he then tried to shower but became even more dyspneic with this activity. This prompted his youngest daughter to call EMS. The patient states that he had some chest discomfort but this was in the setting of increased coughing. He did note that his cough was productive of clear sputum. He denied any fever, chills, or syncope. He denied any recent infections or any sick contacts. He also denies dysuria or diarrhea.    Prior to this event patient states he was in his normal state of health. However, he does note that at baseline he does not perform much physical activity. He states he has become increasingly more deconditioned since the pandemic. He was working multiple jobs until about 2022. At that time he developed an unspecified respiratory illness that lasted about 2 weeks, but he did not seek medical attention for. Since that time he states he becomes dyspneic with minimal activity like showering or walking short distances in his home.     PMH:  Emphysema (centrilobular) CKD stage II HLD Allergic rhinitis  HTN Cerebral AVM GERD  Medications:  HCTz Flomax Breo ellipta  Aspirin '81mg'$   Albuterol   PSH: Past Surgical History:  Procedure Laterality Date   COLONOSCOPY  08/11/2009   IR ANGIO INTRA EXTRACRAN SEL COM CAROTID INNOMINATE BILAT MOD SED  04/14/2021   IR ANGIO VERTEBRAL SEL  SUBCLAVIAN INNOMINATE BILAT MOD SED  04/14/2021   IR US GUIDE VASC ACCESS RIGHT  04/14/2021   POLYPECTOMY      FH: Family History  Problem Relation Age of Onset   Heart attack Mother    Stroke Mother    Hypertension Mother    Diabetes Mother    Heart attack Father    Stroke Father    Hypertension Father    Hypertension Brother    Rheumatic fever Brother    Colon cancer Neg Hx    Colon polyps Neg Hx    Esophageal cancer Neg Hx    Stomach cancer Neg Hx    Rectal cancer Neg Hx     SH:  Lives with spouse, 2 daughters, and four grandkids.  Continues to smoke tobacco. ~2 cigarettes a day and has smoked since ~66 years old. He also notes occasional marijuana use.  Denies current alcohol use.  Able to dress and bathe himself but does this very slowly as this activity causes him to become dyspneic.    Allergies:  NKDA   Full code
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
## 2022-07-19 NOTE — Progress Notes (Signed)
   Subjective:  Patient states that his breathing has improved today.  He states that he has been hospitalized for COPD previously.  Patient states that he has taken Trelegy in the past but is unsure of how well this medication is working for him.  Discussed plan to start the patient on triple therapy for COPD.  Patient agreed with the plan.   Objective:  Vital signs in last 24 hours: Vitals:   07/19/22 0915 07/19/22 1015 07/19/22 1026 07/19/22 1130  BP: 133/84 137/84  124/80  Pulse: 68 75  70  Resp: '17 20  19  '$ Temp:   98.3 F (36.8 C)   TempSrc:   Oral   SpO2: 98% 97%  94%  Weight:      Height:        Physical Exam:  Constitutional: Not in acute distress, resting on the bed.  Cardiovascular: Normal rate, regular rhythm, intact distal pulses. No gallop and no friction rub.  No murmur heard. No lower extremity edema  Pulmonary: Mildly labored breathing on [place]. Accessory muscle [place] present. Normal lungs sounds. No wheezes, rales, or rhonchi.  Abdominal: Soft. Normal bowel sounds. Non distended and non tender Musculoskeletal: No LE edema bilaterally.  Neurological: Alert and oriented to person, place, and time.  Skin: Skin is warm and dry.   Assessment/Plan:  Principal Problem:   Acute hypoxemic respiratory failure ([place])   Derek Patterson is a 66 y/o male with past medical history of COPD, HTN, and chronic tobacco use that presented with acute-onset worsening dyspnea and admitted for management of acute hypoxemic respiratory failure.    # Acute hypoxemic respiratory failure # COPD exacerbation Patient becomes dyspneic with minimal activity at home and continues to use tobacco.  mMRC score of 4.  Placed on bipap on admission. ABG WNL. D-dimer was negative on admission.  BNP was WNL.  Low suspicion for PE or heart failure.  Chest x-ray demonstrated hyperinflation but otherwise normal.  Patient's respiratory viral panel is negative.  I suspect that the patient's exacerbation was likely  related to environmental triggers including dust from his shed.  Patient's oxygen requirements continue to improve but still has mild accessory muscle use. - Albuterol (Proventil) 2.5 mg nebs q 2 hours PRN - Arformoterol (Brovana) 15 mcg nebs BID - Ipratropium-Albuterol (Duoneb) 3 mL nebs q4 hours - Revefenacin (Yuperli) 175 mcg nebs daily - Budesonide (Pulmicort) 0.25 mg nebs BID - PO Prednisone 40 mg daily (for 4 days) - PO Azithromycin 250 mg daily for CAP prophylaxis  - Wean O2 as tolerated   2. # HTN Not hypertensive today.  Will resume home hydrochlorothiazide if patient becomes hypertensive.    3. # History of LUTS - Will resume home tamsulosin 0.4 mg daily.   4. # History of Pulmonary Nodules Noted to be benign on CT chest (2022).  It was recommended that the patient get a repeat CT chest in 2023. -Will repeat CT chest to assess for changes of his nodules   Prior to Admission Living Arrangement: home with his spouse/2 daughters/4 grandchildren Anticipated Discharge Location: home with his spouse/2 daughters/4 grandchildren Barriers to Discharge: continued management Dispo: Anticipated discharge in approximately less than 2 day(s).   Starlyn Skeans, MD 07/19/2022, 1:06 PM Pager: [phone] After 5pm on weekdays and 1pm on weekends: On Call pager [phone]

## 2022-07-19 NOTE — Progress Notes (Signed)
Subjective: I received a page that the patient was complaining of chest pain and went to assess.  Patient states that his chest pain is left-sided and does not radiate.  He says that his chest pain worsens with coughing.  Vital signs WNL.    Physical Exam:  Constitutional: Not in acute distress, resting on the bed eating dinner.  Cardiovascular: Normal rate, regular rhythm, intact distal pulses. No gallop and no friction rub.  No murmur heard. No lower extremity edema  Pulmonary: Mildly labored breathing on [place]. Accessory muscle use present. Normal lungs sounds. No wheezes, rales, or rhonchi.  Abdominal: Soft. Normal bowel sounds. Non distended and non tender Musculoskeletal: No LE edema bilaterally.  Neurological: Alert and oriented to person, place, and time.  Skin: Skin is warm and dry.   Plan:  # Chest pain Repeat EKG was ordered and demonstrated sinus rhythm w/ RBBB and LPFB.  No ST elevation noted. My suspicion for ACS is low. -Will order troponin and reassess

## 2022-07-20 ENCOUNTER — Encounter: Payer: 59 | Admitting: Student

## 2022-07-20 ENCOUNTER — Other Ambulatory Visit (HOSPITAL_COMMUNITY): Payer: Self-pay

## 2022-07-20 DIAGNOSIS — J9601 Acute respiratory failure with hypoxia: Secondary | ICD-10-CM | POA: Diagnosis not present

## 2022-07-20 DIAGNOSIS — F17210 Nicotine dependence, cigarettes, uncomplicated: Secondary | ICD-10-CM | POA: Diagnosis not present

## 2022-07-20 DIAGNOSIS — J441 Chronic obstructive pulmonary disease with (acute) exacerbation: Secondary | ICD-10-CM | POA: Diagnosis not present

## 2022-07-20 MED ORDER — TRELEGY ELLIPTA 100-62.5-25 MCG/ACT IN AEPB
1.0000 | INHALATION_SPRAY | Freq: Every day | RESPIRATORY_TRACT | 0 refills | Status: DC
  Filled 2022-07-20: qty 60, 30d supply, fill #0

## 2022-07-20 MED ORDER — IPRATROPIUM-ALBUTEROL 0.5-2.5 (3) MG/3ML IN SOLN
3.0000 mL | Freq: Three times a day (TID) | RESPIRATORY_TRACT | Status: DC
  Administered 2022-07-20 (×2): 3 mL via RESPIRATORY_TRACT
  Filled 2022-07-20 (×2): qty 3

## 2022-07-20 MED ORDER — PREDNISONE 20 MG PO TABS
40.0000 mg | ORAL_TABLET | Freq: Every day | ORAL | 0 refills | Status: DC
  Filled 2022-07-20: qty 6, 3d supply, fill #0

## 2022-07-20 MED ORDER — ORAL CARE MOUTH RINSE: 15.0000 mL | OROMUCOSAL | Status: DC | PRN

## 2022-07-20 MED ORDER — AZITHROMYCIN 250 MG PO TABS
250.0000 mg | ORAL_TABLET | Freq: Every day | ORAL | 0 refills | Status: DC
  Filled 2022-07-20: qty 1, 1d supply, fill #0

## 2022-07-20 NOTE — Discharge Summary (Addendum)
Name: Derek Patterson MRN: [id number] DOB: 10/16/1956 66 y.o. PCP: [name], MD  Date of Admission: 07/18/2022  8:55 PM Date of Discharge: 07/20/22 Attending Physician: Lottie Mussel, MD  Discharge Diagnosis: 1. Principal Problem:   Acute hypoxemic respiratory failure ([place])  2. Pulmonary Nodule  Discharge Medications: Allergies as of 07/20/2022   No Known Allergies      Medication List     STOP taking these medications    fluticasone furoate-vilanterol 200-25 MCG/ACT Aepb Commonly known as: Breo Ellipta   Spiriva HandiHaler 18 MCG inhalation capsule Generic drug: tiotropium       TAKE these medications    albuterol 108 (90 Base) MCG/ACT inhaler Commonly known as: VENTOLIN HFA INHALE 1-2 PUFFS INTO THE LUNGS EVERY 4 HOURS FOR WHEEZING OR SHORTNESS OF BREATH What changed: See the new instructions.   aspirin EC 81 MG tablet Take 81 mg by mouth daily.   azithromycin 250 MG tablet Commonly known as: ZITHROMAX Take 1 tablet (250 mg total) by mouth daily. Start taking on: July 21, 2022   CENTRUM SILVER 50+MEN PO Take 1 tablet by mouth daily.   fexofenadine 60 MG tablet Commonly known as: ALLEGRA Take 1 tablet (60 mg total) by mouth 2 (two) times daily.   fluticasone 50 MCG/ACT nasal spray Commonly known as: FLONASE Place 1 spray into both nostrils daily. What changed:  when to take this reasons to take this   hydrochlorothiazide 25 MG tablet Commonly known as: HYDRODIURIL TAKE 1 TABLET BY MOUTH EVERY DAY   nicotine 7 mg/24hr patch Commonly known as: NICODERM CQ - dosed in mg/24 hr Place 1 patch (7 mg total) onto the skin daily.   predniSONE 20 MG tablet Commonly known as: DELTASONE Take 2 tablets (40 mg total) by mouth daily with breakfast. Start taking on: July 21, 2022   tamsulosin 0.4 MG Caps capsule Commonly known as: FLOMAX TAKE 1 CAPSULE BY MOUTH EVERY DAY AFTER SUPPER What changed: See the new instructions.   Trelegy  Ellipta 100-62.5-25 MCG/ACT Aepb Generic drug: Fluticasone-Umeclidin-Vilant Inhale 1 puff into the lungs daily.        Disposition and follow-up:   Mr.Jadarian LAIKEN NOHR was discharged from [facility] in Good condition.  At the hospital follow up visit please address:  1.  Acute Hypoxemic Respiratory Failure secondary to COPD exacerbation - Discuss medication changes - CT demonstrating growth of pulmonary nodule compared to 2022  2.  Labs / imaging needed at time of follow-up: none   3.  Pending labs/ test needing follow-up: none  Follow-up Appointments: - Please follow-up with your primary care provider Dr. Simeon Craft on Thursday, 07/27/2022 at [place] Hospital Course by problem list: Derek Patterson is a 66 y/o male with past medical history of COPD, HTN, and chronic tobacco use that presented with acute-onset worsening dyspnea and admitted for management of acute hypoxemic respiratory failure secondary to COPD exacerbation.      # Acute hypoxemic respiratory failure # COPD exacerbation Patient was experiencing dyspnea with minimal activity at home and continues to use tobacco.  Episode began while the patient was cleaning out his shed.  Patient presented with labored breathing requiring 10 L on BiPAP.  ABG was WNL on admission.  D-dimer negative.  BNP was WNL.  Chest x-ray demonstrated hyperinflation but otherwise normal.  Respiratory viral panel and COVID were negative.  Patient was treated with albuterol nebs, Arformoterol nebs, ipratropium-albuterol DuoNebs, revefenacin nebs, budesonide nebs, PO prednisone, and p.o. azithromycin  for CAP prophylaxis.  Patient tolerated being weaned off of oxygen.  Patient completed 2 days of prednisone and will require 3 days of treatment as an outpatient.  Patient completed 2 days of azithromycin and will require 1 day of treatment outpatient.  We discontinued the patient's Breo Ellipta and Spiriva upon discharge and replaced these with  Trelegy Ellipta.    2. # HTN Patient was not hypertensive during his hospital stay.  Home hydrochlorothiazide was held.    3. # History of LUTS Continued home tamsulosin.   4. # History of Pulmonary Nodules Patient had a CT chest done in 2022 that demonstrated benign appearing 2.8 mm right lower lobe pulmonary nodule.  It was recommended that the patient get a repeat chest CT in 12 months.  Patient had not gotten the CT done yet and therefore we performed in the hospital.  CT done while hospitalized demonstrates possible 5 mm left upper lobe pulmonary nodule.  We recommend the patient get yearly imaging to track progression of these potential nodules.   Discharge Exam:   BP 117/84 (BP Location: Left Arm)   Pulse 68   Temp 98 F (36.7 C) (Oral)   Resp 16   Ht '5\' 11"'$  (1.803 m)   Wt 65.2 kg   SpO2 97%   BMI 20.05 kg/m   Physical Exam:   Constitutional: Not in acute distress, resting comfortably in bed.  Cardiovascular: Normal rate, regular rhythm, intact distal pulses. No gallop and no friction rub. No murmur heard.  Pulmonary: Normal work of breathing. No accessory muscle use. Normal lungs sounds. No wheezes, rales, or rhonchi.  Abdominal: Soft. Normal bowel sounds. No distension or tenderness.  Musculoskeletal: No LE edema bilaterally.  Neurological: Alert and oriented to person, place, and time.  Skin: Skin is warm and dry.     Pertinent Labs, Studies, and Procedures:     Latest Ref Rng & Units 07/19/2022    2:25 AM 07/19/2022   12:54 AM 07/18/2022    9:21 PM  CBC  WBC 4.0 - 10.5 K/uL 6.2     Hemoglobin 13.0 - 17.0 g/dL 14.7  14.6  17.3   Hematocrit 39.0 - 52.0 % 44.4  43.0  51.0   Platelets 150 - 400 K/uL 243          Latest Ref Rng & Units 07/19/2022    2:25 AM 07/19/2022   12:54 AM 07/18/2022    9:21 PM  BMP  Glucose 70 - 99 mg/dL 141     BUN 8 - 23 mg/dL 16     Creatinine 0.61 - 1.24 mg/dL 1.03     Sodium 135 - 145 mmol/L 138  136  136   Potassium 3.5 - 5.1 mmol/L  3.9  3.5  3.7   Chloride 98 - 111 mmol/L 102     CO2 22 - 32 mmol/L 24     Calcium 8.9 - 10.3 mg/dL 9.4        CXR IMPRESSION: 1. Stable emphysema.  No acute process.  CT Chest IMPRESSION: 1. No acute explanation for patient's progressive shortness of breath and wheezing. 2. Diagnostic CT was performed, given patient's symptoms. Patient can return to lung cancer screening population when asymptomatic. 3. Possible developing 5 mm left upper lobe pulmonary nodule. Recommend attention on follow-up. 4. Aortic atherosclerosis (ICD10-I70.0) and emphysema (ICD10-J43.9).  Discharge Instructions: Discharge Instructions     Call MD for:  difficulty breathing, headache or visual disturbances   Complete by: As directed  Call MD for:  persistant dizziness or light-headedness   Complete by: As directed    Call MD for:  severe uncontrolled pain   Complete by: As directed    Diet - low sodium heart healthy   Complete by: As directed    Increase activity slowly   Complete by: As directed       You were hospitalized for worsening of your COPD.   Hospital course: - We treated your COPD with medications and oxygen to improve your breathing.   Medications: Please start taking: -Azithromycin (Zithromax) 250 mg tablet (1 tablet by mouth daily) on 07/21/22 to decrease your risk of pneumonia. -Prednisone (Deltasone) 20 mg tablet (2 tablets by mouth (40 mg total) daily for 3 days starting on 07/21/22 for your COPD. -Fluticasone-Umeclidin-vilanterol (Trelegy Ellipta) 100-62.5-25 mcg/ACT (inhale 1 puff into lungs daily) for COPD.   Please stop taking:  -Fluticasone furoate-vilanterol (Breo Ellipta) 200-25 mcg/ACT (inhale 1 puff into the lungs daily) for your COPD. -Tiotropium (Spiriva HandiHaler) 18 mcg inhalation capsule (1 capsule into inhaler and inhale daily) for  your COPD.   Please continue taking: -Albuterol (Ventolin HFA) 108 (90 base) MCG/ACT inhaler (inhale 1-2 puffs into the lungs  every 4 hours) for wheezing or shortness of breath. -Aspirin 81 mg EC tablet (1 tablet by mouth daily) for your cardiovascular health. -Multiple vitamins/minerals (1 tablet by mouth daily) for your health. -Fexofenadine (Allegra) 60 mg tablet (1 tablet by mouth twice daily) for allergies. -Fluticasone (Flonase) 50 mcg/ACT nasal spray (1 spray into both nostrils daily) for allergies. -Hydrochlorothiazide (HydroDIURIL) 25 MG tablet (1 tablet by mouth every day) for high blood pressure. -Nicotine (NicoDerm CQ) 7 mg / 24-hour patch (1 patch on the skin daily) for smoking cessation. -Tamsulosin (Flomax) 0.4 mg capsule (1 capsule by mouth daily) for difficulty urinating.   Follow-up: - Please follow-up with your primary care provider Dr. Simeon Craft on Thursday, 07/27/2022 at 11 AM.  Signed: Starlyn Skeans, MD 07/20/2022, 3:26 PM   Pager: [phone]

## 2022-07-20 NOTE — Discharge Instructions (Addendum)
[place] Hospital Stay Proper nutrition can help your body recover from illness and injury.   Foods and beverages high in protein, vitamins, and minerals help rebuild muscle loss, promote healing, & reduce fall risk.   In addition to eating healthy foods, a nutrition shake is an easy, delicious way to get the nutrition you need during and after your hospital stay  It is recommended that you continue to drink 2 bottles per day of:  Ensure Plus for at least 1 month (30 days) after your hospital stay   Tips for adding a nutrition shake into your routine: As allowed, drink one with vitamins or medications instead of water or juice Enjoy one as a tasty mid-morning or afternoon snack Drink cold or make a milkshake out of it Drink one instead of milk with cereal or snacks Use as a coffee creamer   Available at the following grocery stores and pharmacies:           * [place] [phone]            For COUPONS visit: www.ensure.com/join or [email or web address]   Suggested Substitutions Ensure Plus = Boost Plus = Carnation Breakfast Essentials = Boost Compact  ------------------------------------------------------------------------------------------------------------------------------------------------------------------------------------------------ You were hospitalized for worsening of your COPD.  Hospital course: - We treated your COPD with medications and oxygen to improve your breathing.  Medications: Please start taking: -Azithromycin (Zithromax) 250 mg tablet (1 tablet by mouth daily) on 07/21/22 to decrease your risk of pneumonia. -Prednisone (Deltasone) 20 mg tablet (2 tablets by mouth (40 mg total) daily for 3 days starting on 07/21/22 for your COPD. -Fluticasone-Umeclidin-vilanterol (Trelegy Ellipta)  100-62.5-25 mcg/ACT (inhale 1 puff into lungs daily) for COPD.  Please stop taking:  -Fluticasone furoate-vilanterol (Breo Ellipta) 200-25 mcg/ACT (inhale 1 puff into the lungs daily) for your COPD. -Tiotropium (Spiriva HandiHaler) 18 mcg inhalation capsule (1 capsule into inhaler and inhale daily) for  your COPD.  Please continue taking: -Albuterol (Ventolin HFA) 108 (90 base) MCG/ACT inhaler (inhale 1-2 puffs into the lungs every 4 hours) for wheezing or shortness of breath. -Aspirin 81 mg EC tablet (1 tablet by mouth daily) for your cardiovascular health. -Multiple vitamins/minerals (1 tablet by mouth daily) for your health. -Fexofenadine (Allegra) 60 mg tablet (1 tablet by mouth twice daily) for allergies. -Fluticasone (Flonase) 50 mcg/ACT nasal spray (1 spray into both nostrils daily) for allergies. -Hydrochlorothiazide (HydroDIURIL) 25 MG tablet (1 tablet by mouth every day) for high blood pressure. -Nicotine (NicoDerm CQ) 7 mg / 24-hour patch (1 patch on the skin daily) for smoking cessation. -Tamsulosin (Flomax) 0.4 mg capsule (1 capsule by mouth daily) for difficulty urinating.  Follow-up: - Please follow-up with your primary care provider Dr. Simeon Craft on Thursday, 07/27/2022 at 11 AM.

## 2022-07-20 NOTE — Progress Notes (Signed)
Initial Nutrition Assessment  DOCUMENTATION CODES:   Not applicable  INTERVENTION:   Multivitamin w/ minerals daily Encourage good PO intake  NUTRITION DIAGNOSIS:   Increased nutrient needs related to chronic illness (COPD) as evidenced by estimated needs.  GOAL:   Patient will meet greater than or equal to 90% of their needs  MONITOR:   PO intake, Supplement acceptance, Labs, Weight trends  REASON FOR ASSESSMENT:   Consult Assessment of nutrition requirement/status  ASSESSMENT:   66 y.o. male presented to the ED with worsening dyspnea. PMH includes COPD, CKD II, and HTN. Pt admitted with COPD exacerbation.   Pt reports that his appetite and PO intake has improved recently. Reports that he was having tooth and jaw pain for a little while that was impacting his eating, but that has now resolved. States that his wife and daughter typically does the cooking for him. Eats primarily fish and chicken. Shared that he use to drink Ensure and is thinking about starting them again. RD encouraged pt to use Ensure if PO intake is poor due to mouth pain or to help with weight loss.   Pt reports that his UBW is 160# and believes that he is now 140# after having mouth pain and eating poor. Per EMR, pt has not had any weight loss over the past year.  Pt with known muscle depletions, pt at risk for malnutrition.   Discussed the use of supplements at home. Encouraged pt to eat during meal time and use a shake if he feels he needs support or as a night time snack prior to bed. RD offered to order pt ONS during admission, pt declined at this time.   Pt with no other questions or concerns at this time.   Medications reviewed and include: Zithromax, MVI, Prednisone Labs reviewed.   NUTRITION - FOCUSED PHYSICAL EXAM:  Flowsheet Row Most Recent Value  Orbital Region No depletion  Upper Arm Region No depletion  Thoracic and Lumbar Region No depletion  Buccal Region Moderate depletion   Temple Region No depletion  Clavicle Bone Region No depletion  Clavicle and Acromion Bone Region No depletion  Scapular Bone Region No depletion  Dorsal Hand No depletion  Patellar Region Severe depletion  Anterior Thigh Region Severe depletion  Posterior Calf Region Severe depletion  Edema (RD Assessment) None  Hair Reviewed  Eyes Reviewed  Mouth Reviewed  Skin Reviewed  Nails Reviewed   Diet Order:   Diet Order             Diet - low sodium heart healthy           Diet regular Room service appropriate? Yes; Fluid consistency: Thin  Diet effective now                   EDUCATION NEEDS:   Education needs have been addressed  Skin:  Skin Assessment: Reviewed RN Assessment  Last BM:  Unknown  Height:   Ht Readings from Last 1 Encounters:  07/20/22 '5\' 11"'$  (1.803 m)    Weight:   Wt Readings from Last 1 Encounters:  07/18/22 65.2 kg    Ideal Body Weight:  78.2 kg  BMI:  Body mass index is 20.05 kg/m.  Estimated Nutritional Needs:   Kcal:  1800-2000  Protein:  90-105 grams  Fluid:  >/= 1.8 L    Hermina Barters RD, LDN Clinical Dietitian See [facility] for contact information.

## 2022-07-20 NOTE — Progress Notes (Signed)
  Transition of Care [facility]) Screening Note   Patient Details  Name: Derek Patterson Date of Birth: 1956-04-20   Transition of Care [facility]) CM/SW Contact:    Cyndi Bender, RN Phone Number: 07/20/2022, 9:19 AM    Transition of Care Department [facility]) has reviewed patient and no TOC needs have been identified at this time. We will continue to monitor patient advancement through interdisciplinary progression rounds. If new patient transition needs arise, please place a TOC consult.

## 2022-07-27 ENCOUNTER — Encounter: Payer: 59 | Admitting: Student

## 2022-08-02 ENCOUNTER — Other Ambulatory Visit: Payer: Self-pay

## 2022-08-02 ENCOUNTER — Other Ambulatory Visit (HOSPITAL_COMMUNITY): Payer: Self-pay

## 2022-08-02 ENCOUNTER — Ambulatory Visit (INDEPENDENT_AMBULATORY_CARE_PROVIDER_SITE_OTHER): Payer: 59 | Admitting: Student

## 2022-08-02 ENCOUNTER — Encounter: Payer: Self-pay | Admitting: Student

## 2022-08-02 VITALS — BP 146/74 | HR 65 | Temp 97.8°F | Ht 71.0 in | Wt 141.8 lb

## 2022-08-02 DIAGNOSIS — F17210 Nicotine dependence, cigarettes, uncomplicated: Secondary | ICD-10-CM

## 2022-08-02 DIAGNOSIS — J441 Chronic obstructive pulmonary disease with (acute) exacerbation: Secondary | ICD-10-CM | POA: Diagnosis not present

## 2022-08-02 DIAGNOSIS — Z720 Tobacco use: Secondary | ICD-10-CM

## 2022-08-02 DIAGNOSIS — J449 Chronic obstructive pulmonary disease, unspecified: Secondary | ICD-10-CM

## 2022-08-02 MED ORDER — TRELEGY ELLIPTA 100-62.5-25 MCG/ACT IN AEPB
1.0000 | INHALATION_SPRAY | Freq: Every day | RESPIRATORY_TRACT | 3 refills | Status: DC
  Filled 2022-08-02: qty 60, 30d supply, fill #0

## 2022-08-02 NOTE — Patient Instructions (Addendum)
Thank you, [name] for allowing [place] to provide your care today. Today we discussed your lung disease; please continue using the Trellegy inhaler every day, and the albuterol inhaler as needed.  We have also placed a referral for pulmonary rehab.  We are also talking with our pharmacist to hep you get your medications.   Congratulations on cutting down your smoking. Please consider medications to help with cravings and shakes if you feel like you need help with it.    My Chart Access: [email or web address]  Please follow-up in in 12 weeks.  Please make sure to arrive 15 minutes prior to your next appointment. If you arrive late, you may be asked to reschedule.    We look forward to seeing you next time. Please call our clinic at [phone] if you have any questions or concerns. The best time to call is Monday-Friday from 9am-4pm, but there is someone available 24/7. If after hours or the weekend, call the main hospital number and ask for the Internal Medicine Resident On-Call. If you need medication refills, please notify your pharmacy one week in advance and they will send [place] a request.   Thank you for letting us take part in your care. Wishing you the best!  [name], MD 08/02/2022, 3:22 PM [facility] Internal Medicine Resident, PGY-1

## 2022-08-03 ENCOUNTER — Telehealth: Payer: Self-pay

## 2022-08-03 ENCOUNTER — Other Ambulatory Visit (HOSPITAL_COMMUNITY): Payer: Self-pay

## 2022-08-03 MED ORDER — TRELEGY ELLIPTA 100-62.5-25 MCG/ACT IN AEPB: 1.0000 | INHALATION_SPRAY | Freq: Every day | RESPIRATORY_TRACT | 11 refills | Status: DC

## 2022-08-03 NOTE — Assessment & Plan Note (Deleted)
Patient with hx of COPD and tobacco use disorder who was recently discharged from the hospital on 8/10 for COPD exacerbation presenting to clinic after having completed prednisone and azithromycin course outpatient for hospital follow up. Patient was previously on LABA and ICS, discharged Trelegy Ellipta (ICS, LAMA, LABS). Patient had trouble accessing mediation for financial/insurance constraints. He has continued using his albuterol inhaler, though less frequently compared to prior to hospitalization. He reports his cough has improved; denies increased stputum production, wheezing, shortness of breath, DOE, lightheadedness, confusion, palpitations, or CP. Derek Patterson reports he has significantly reduced the number of cigarettes he smokes.   Stable vital signs. No acute abnormalities on exam today. Patient received an [facility] clinic sample of Trelegy while Ms. Derek Patterson, CPhT helps patient obtain Trelegy at an affordable price. Patient is also being referred to pulmonary rehab today. -Follow up ambulatory referral to pulmonary rehab -Continue SABA PRN -Continue Trelegy daily -Follow up in one month

## 2022-08-03 NOTE — Assessment & Plan Note (Signed)
Patient reports cutting down on number of cirgarettes smoked in a day. Since discharge, he has taken a couple of puff in the morning with coffee, 2 cigarettes max. He mentioned that this usually curbs the craving and shakes. Patient has tried nicotine gum (did not like flavor) and patches, which he reports made him aggressive and would not like to try. He is motivated to quit and thinks he can do so without medical therapy at this time. Offered information about medications and to please consider them if he needs extra help with cravings and withdrawal effects. He understood the importance of this for his pulmonary health and overall ASCVD risk.
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
## 2022-08-03 NOTE — Telephone Encounter (Signed)
Faxed med change from [place] to [facility] to [facility] patient assistance.

## 2022-08-03 NOTE — Assessment & Plan Note (Addendum)
Patient with hx of COPD and tobacco use disorder who was recently discharged from the hospital on 8/10 for COPD exacerbation presenting to clinic after having completed prednisone and azithromycin course outpatient for hospital follow up. Patient was previously on LABA and ICS, discharged Trelegy Ellipta (ICS, LAMA, LABS). Patient had trouble accessing mediation for financial/insurance constraints. He has continued using his albuterol inhaler, though less frequently compared to prior to hospitalization. He reports his cough has improved; denies increased stputum production, wheezing, shortness of breath, DOE, lightheadedness, confusion, palpitations, or CP. [name] reports he has significantly reduced the number of cigarettes he smokes.   Stable vital signs. No acute abnormalities on exam today. Patient received an [facility] clinic sample of Trelegy while Ms. Hortencia Pilar, CPhT helps patient obtain Trelegy at an affordable price. Patient is also being referred to pulmonary rehab today. -Follow up ambulatory referral to pulmonary rehab -Continue SABA PRN -Continue Trelegy daily -Follow up in 3 months

## 2022-08-03 NOTE — Progress Notes (Signed)
Internal Medicine Clinic Attending  I saw and evaluated the patient.  I personally confirmed the key portions of the history and exam documented by Dr. Simeon Craft and I reviewed pertinent patient test results.  The assessment, diagnosis, and plan were formulated together and I agree with the documentation in the resident's note.

## 2022-08-10 ENCOUNTER — Other Ambulatory Visit: Payer: Self-pay | Admitting: Internal Medicine

## 2022-09-06 ENCOUNTER — Other Ambulatory Visit: Payer: Self-pay | Admitting: Internal Medicine

## 2022-09-08 ENCOUNTER — Other Ambulatory Visit: Payer: Self-pay | Admitting: Internal Medicine

## 2022-09-08 DIAGNOSIS — J449 Chronic obstructive pulmonary disease, unspecified: Secondary | ICD-10-CM

## 2022-09-08 DIAGNOSIS — J441 Chronic obstructive pulmonary disease with (acute) exacerbation: Secondary | ICD-10-CM

## 2022-09-08 NOTE — Addendum Note (Signed)
Addended by: [name] on: 09/08/2022 08:59 AM   Modules accepted: Orders

## 2022-09-26 ENCOUNTER — Telehealth (HOSPITAL_COMMUNITY): Payer: Self-pay

## 2022-09-26 NOTE — Telephone Encounter (Signed)
Called and spoke with pt in regards to PR, pt stated he is not interested at this time due to other medical issues.   Closed referral

## 2022-10-16 ENCOUNTER — Other Ambulatory Visit: Payer: Self-pay | Admitting: Internal Medicine

## 2022-10-16 DIAGNOSIS — J449 Chronic obstructive pulmonary disease, unspecified: Secondary | ICD-10-CM

## 2022-12-14 DIAGNOSIS — I10 Essential (primary) hypertension: Secondary | ICD-10-CM | POA: Diagnosis not present

## 2022-12-14 DIAGNOSIS — J309 Allergic rhinitis, unspecified: Secondary | ICD-10-CM | POA: Diagnosis not present

## 2022-12-14 DIAGNOSIS — R39198 Other difficulties with micturition: Secondary | ICD-10-CM | POA: Diagnosis not present

## 2022-12-14 DIAGNOSIS — F17210 Nicotine dependence, cigarettes, uncomplicated: Secondary | ICD-10-CM | POA: Diagnosis not present

## 2022-12-14 DIAGNOSIS — J449 Chronic obstructive pulmonary disease, unspecified: Secondary | ICD-10-CM | POA: Diagnosis not present

## 2022-12-17 ENCOUNTER — Emergency Department (HOSPITAL_COMMUNITY): Payer: 59

## 2022-12-17 ENCOUNTER — Observation Stay (HOSPITAL_COMMUNITY)
Admission: EM | Admit: 2022-12-17 | Discharge: 2022-12-18 | Disposition: A | Discharge status: Home / Self Care | Payer: 59 | Attending: Internal Medicine | Admitting: Internal Medicine

## 2022-12-17 ENCOUNTER — Other Ambulatory Visit: Payer: Self-pay

## 2022-12-17 DIAGNOSIS — N183 Chronic kidney disease, stage 3 unspecified: Secondary | ICD-10-CM | POA: Insufficient documentation

## 2022-12-17 DIAGNOSIS — Z1152 Encounter for screening for COVID-19: Secondary | ICD-10-CM | POA: Insufficient documentation

## 2022-12-17 DIAGNOSIS — I129 Hypertensive chronic kidney disease with stage 1 through stage 4 chronic kidney disease, or unspecified chronic kidney disease: Secondary | ICD-10-CM | POA: Diagnosis not present

## 2022-12-17 DIAGNOSIS — R0603 Acute respiratory distress: Secondary | ICD-10-CM | POA: Diagnosis not present

## 2022-12-17 DIAGNOSIS — I10 Essential (primary) hypertension: Secondary | ICD-10-CM | POA: Diagnosis present

## 2022-12-17 DIAGNOSIS — N400 Enlarged prostate without lower urinary tract symptoms: Secondary | ICD-10-CM | POA: Insufficient documentation

## 2022-12-17 DIAGNOSIS — Z79899 Other long term (current) drug therapy: Secondary | ICD-10-CM | POA: Diagnosis not present

## 2022-12-17 DIAGNOSIS — J439 Emphysema, unspecified: Secondary | ICD-10-CM | POA: Diagnosis not present

## 2022-12-17 DIAGNOSIS — J441 Chronic obstructive pulmonary disease with (acute) exacerbation: Secondary | ICD-10-CM | POA: Diagnosis not present

## 2022-12-17 DIAGNOSIS — J9602 Acute respiratory failure with hypercapnia: Secondary | ICD-10-CM | POA: Insufficient documentation

## 2022-12-17 DIAGNOSIS — R911 Solitary pulmonary nodule: Secondary | ICD-10-CM | POA: Diagnosis not present

## 2022-12-17 DIAGNOSIS — R61 Generalized hyperhidrosis: Secondary | ICD-10-CM | POA: Diagnosis not present

## 2022-12-17 DIAGNOSIS — Z7982 Long term (current) use of aspirin: Secondary | ICD-10-CM | POA: Diagnosis not present

## 2022-12-17 DIAGNOSIS — F17210 Nicotine dependence, cigarettes, uncomplicated: Secondary | ICD-10-CM | POA: Diagnosis not present

## 2022-12-17 DIAGNOSIS — F17200 Nicotine dependence, unspecified, uncomplicated: Secondary | ICD-10-CM | POA: Diagnosis present

## 2022-12-17 DIAGNOSIS — R0902 Hypoxemia: Secondary | ICD-10-CM | POA: Diagnosis not present

## 2022-12-17 DIAGNOSIS — R0602 Shortness of breath: Secondary | ICD-10-CM | POA: Diagnosis not present

## 2022-12-17 LAB — I-STAT VENOUS BLOOD GAS, ED
Acid-Base Excess: 2 mmol/L (ref 0.0–2.0)
Bicarbonate: 31.2 mmol/L — ABNORMAL HIGH (ref 20.0–28.0)
Calcium, Ion: 0.96 mmol/L — ABNORMAL LOW (ref 1.15–1.40)
HCT: 51 % (ref 39.0–52.0)
Hemoglobin: 17.3 g/dL — ABNORMAL HIGH (ref 13.0–17.0)
O2 Saturation: 96 %
Potassium: 3.7 mmol/L (ref 3.5–5.1)
Sodium: 138 mmol/L (ref 135–145)
TCO2: 33 mmol/L — ABNORMAL HIGH (ref 22–32)
pCO2, Ven: 68.5 mmHg — ABNORMAL HIGH (ref 44–60)
pH, Ven: 7.267 (ref 7.25–7.43)
pO2, Ven: 98 mmHg — ABNORMAL HIGH (ref 32–45)

## 2022-12-17 LAB — I-STAT CHEM 8, ED
BUN: 26 mg/dL — ABNORMAL HIGH (ref 8–23)
Calcium, Ion: 0.91 mmol/L — ABNORMAL LOW (ref 1.15–1.40)
Chloride: 97 mmol/L — ABNORMAL LOW (ref 98–111)
Creatinine, Ser: 1.3 mg/dL — ABNORMAL HIGH (ref 0.61–1.24)
Glucose, Bld: 134 mg/dL — ABNORMAL HIGH (ref 70–99)
HCT: 51 % (ref 39.0–52.0)
Hemoglobin: 17.3 g/dL — ABNORMAL HIGH (ref 13.0–17.0)
Potassium: 3.8 mmol/L (ref 3.5–5.1)
Sodium: 139 mmol/L (ref 135–145)
TCO2: 32 mmol/L (ref 22–32)

## 2022-12-17 LAB — COMPREHENSIVE METABOLIC PANEL
ALT: 27 U/L (ref 0–44)
AST: 48 U/L — ABNORMAL HIGH (ref 15–41)
Albumin: 4 g/dL (ref 3.5–5.0)
Alkaline Phosphatase: 94 U/L (ref 38–126)
Anion gap: 12 (ref 5–15)
BUN: 19 mg/dL (ref 8–23)
CO2: 28 mmol/L (ref 22–32)
Calcium: 9.1 mg/dL (ref 8.9–10.3)
Chloride: 98 mmol/L (ref 98–111)
Creatinine, Ser: 1.37 mg/dL — ABNORMAL HIGH (ref 0.61–1.24)
GFR, Estimated: 57 mL/min — ABNORMAL LOW (ref >60)
Glucose, Bld: 139 mg/dL — ABNORMAL HIGH (ref 70–99)
Potassium: 3.8 mmol/L (ref 3.5–5.1)
Sodium: 138 mmol/L (ref 135–145)
Total Bilirubin: 1 mg/dL (ref 0.3–1.2)
Total Protein: 7 g/dL (ref 6.5–8.1)

## 2022-12-17 LAB — CBC WITH DIFFERENTIAL/PLATELET

## 2022-12-17 LAB — TROPONIN I (HIGH SENSITIVITY): Troponin I (High Sensitivity): 4 ng/L (ref <18)

## 2022-12-17 MED ORDER — ALBUTEROL SULFATE (2.5 MG/3ML) 0.083% IN NEBU
10.0000 mg/h | INHALATION_SOLUTION | Freq: Once | RESPIRATORY_TRACT | Status: AC
  Administered 2022-12-17: 10 mg/h via RESPIRATORY_TRACT
  Filled 2022-12-17: qty 12

## 2022-12-17 NOTE — ED Triage Notes (Signed)
Patient bib [facility] from home with shortness of breath. Onset was a\bout a hour and has been progressively worsening. [facility] reports hypertension, tachycardia, tachypnea. [facility] had him on CPAP enroute. He has a history of COPD and emphysema.
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
## 2022-12-17 NOTE — Progress Notes (Signed)
RT placed pt in standby on BiPAP per MD to see how pt tolerates being on room air. RN made aware, nasal cannula at bedside if needed. Will continue to monitor.

## 2022-12-17 NOTE — ED Provider Notes (Incomplete)
Care assumed from Dr. Mammie Lorenzo, patient with COPD exacerbation on BiPAP. Will need to be admitted.

## 2022-12-18 ENCOUNTER — Other Ambulatory Visit: Payer: Self-pay | Admitting: Internal Medicine

## 2022-12-18 ENCOUNTER — Encounter (HOSPITAL_COMMUNITY): Payer: Self-pay

## 2022-12-18 DIAGNOSIS — F17210 Nicotine dependence, cigarettes, uncomplicated: Secondary | ICD-10-CM

## 2022-12-18 DIAGNOSIS — R911 Solitary pulmonary nodule: Secondary | ICD-10-CM | POA: Insufficient documentation

## 2022-12-18 DIAGNOSIS — J441 Chronic obstructive pulmonary disease with (acute) exacerbation: Secondary | ICD-10-CM | POA: Diagnosis present

## 2022-12-18 DIAGNOSIS — J449 Chronic obstructive pulmonary disease, unspecified: Secondary | ICD-10-CM

## 2022-12-18 LAB — CBC
HCT: 45.8 % (ref 39.0–52.0)
Hemoglobin: 15.4 g/dL (ref 13.0–17.0)
MCH: 27.7 pg (ref 26.0–34.0)
MCHC: 33.6 g/dL (ref 30.0–36.0)
MCV: 82.4 fL (ref 80.0–100.0)
Platelets: 249 10*3/uL (ref 150–400)
RBC: 5.56 MIL/uL (ref 4.22–5.81)
RDW: 13.6 % (ref 11.5–15.5)
WBC: 5.4 10*3/uL (ref 4.0–10.5)
nRBC: 0 % (ref 0.0–0.2)

## 2022-12-18 LAB — I-STAT VENOUS BLOOD GAS, ED
Acid-Base Excess: 1 mmol/L (ref 0.0–2.0)
Bicarbonate: 24.9 mmol/L (ref 20.0–28.0)
Calcium, Ion: 1.16 mmol/L (ref 1.15–1.40)
HCT: 47 % (ref 39.0–52.0)
Hemoglobin: 16 g/dL (ref 13.0–17.0)
O2 Saturation: 98 %
Potassium: 3.9 mmol/L (ref 3.5–5.1)
Sodium: 136 mmol/L (ref 135–145)
TCO2: 26 mmol/L (ref 22–32)
pCO2, Ven: 37.1 mmHg — ABNORMAL LOW (ref 44–60)
pH, Ven: 7.435 — ABNORMAL HIGH (ref 7.25–7.43)
pO2, Ven: 102 mmHg — ABNORMAL HIGH (ref 32–45)

## 2022-12-18 LAB — TROPONIN I (HIGH SENSITIVITY): Troponin I (High Sensitivity): 5 ng/L (ref <18)

## 2022-12-18 LAB — BASIC METABOLIC PANEL
Anion gap: 10 (ref 5–15)
BUN: 21 mg/dL (ref 8–23)
CO2: 26 mmol/L (ref 22–32)
Calcium: 9.3 mg/dL (ref 8.9–10.3)
Chloride: 99 mmol/L (ref 98–111)
Creatinine, Ser: 1.18 mg/dL (ref 0.61–1.24)
GFR, Estimated: >60 mL/min (ref >60)
Glucose, Bld: 164 mg/dL — ABNORMAL HIGH (ref 70–99)
Potassium: 3.8 mmol/L (ref 3.5–5.1)
Sodium: 135 mmol/L (ref 135–145)

## 2022-12-18 LAB — CBC WITH DIFFERENTIAL/PLATELET
Abs Immature Granulocytes: 0.01 10*3/uL (ref 0.00–0.07)
Basophils Absolute: 0.1 10*3/uL (ref 0.0–0.1)
Basophils Relative: 1 %
Eosinophils Absolute: 0.1 10*3/uL (ref 0.0–0.5)
Eosinophils Relative: 1 %
HCT: 49 % (ref 39.0–52.0)
Hemoglobin: 16.2 g/dL (ref 13.0–17.0)
Immature Granulocytes: 0 %
Lymphocytes Relative: 68 %
Lymphs Abs: 7.4 10*3/uL — ABNORMAL HIGH (ref 0.7–4.0)
MCH: 27.9 pg (ref 26.0–34.0)
MCHC: 33.1 g/dL (ref 30.0–36.0)
MCV: 84.3 fL (ref 80.0–100.0)
Monocytes Absolute: 0.7 10*3/uL (ref 0.1–1.0)
Monocytes Relative: 7 %
Neutro Abs: 2.5 10*3/uL (ref 1.7–7.7)
Neutrophils Relative %: 23 %
Platelets: 275 10*3/uL (ref 150–400)
RBC: 5.81 MIL/uL (ref 4.22–5.81)
RDW: 13.8 % (ref 11.5–15.5)
WBC: 10.9 10*3/uL — ABNORMAL HIGH (ref 4.0–10.5)
nRBC: 0 % (ref 0.0–0.2)

## 2022-12-18 LAB — BRAIN NATRIURETIC PEPTIDE: B Natriuretic Peptide: 6.1 pg/mL (ref 0.0–100.0)

## 2022-12-18 LAB — RESP PANEL BY RT-PCR (RSV, FLU A&B, COVID)  RVPGX2
Influenza A by PCR: NEGATIVE
Influenza B by PCR: NEGATIVE
Resp Syncytial Virus by PCR: NEGATIVE
SARS Coronavirus 2 by RT PCR: NEGATIVE

## 2022-12-18 MED ORDER — ENOXAPARIN SODIUM 40 MG/0.4ML IJ SOSY
40.0000 mg | PREFILLED_SYRINGE | INTRAMUSCULAR | Status: DC
  Administered 2022-12-18: 40 mg via SUBCUTANEOUS
  Filled 2022-12-18: qty 0.4

## 2022-12-18 MED ORDER — IPRATROPIUM-ALBUTEROL 0.5-2.5 (3) MG/3ML IN SOLN
3.0000 mL | RESPIRATORY_TRACT | Status: DC | PRN
  Administered 2022-12-18: 3 mL via RESPIRATORY_TRACT
  Filled 2022-12-18: qty 3

## 2022-12-18 MED ORDER — TAMSULOSIN HCL 0.4 MG PO CAPS: 0.4000 mg | ORAL_CAPSULE | Freq: Every day | ORAL | Status: DC

## 2022-12-18 MED ORDER — PREDNISONE 20 MG PO TABS
40.0000 mg | ORAL_TABLET | Freq: Every day | ORAL | Status: DC
  Administered 2022-12-18: 40 mg via ORAL
  Filled 2022-12-18: qty 2

## 2022-12-18 MED ORDER — ASPIRIN 81 MG PO TBEC
81.0000 mg | DELAYED_RELEASE_TABLET | Freq: Every day | ORAL | Status: DC
  Administered 2022-12-18: 81 mg via ORAL
  Filled 2022-12-18: qty 1

## 2022-12-18 MED ORDER — FLUTICASONE FUROATE-VILANTEROL 100-25 MCG/ACT IN AEPB
1.0000 | INHALATION_SPRAY | Freq: Every day | RESPIRATORY_TRACT | Status: DC
  Filled 2022-12-18 (×2): qty 28

## 2022-12-18 MED ORDER — HYDROCHLOROTHIAZIDE 25 MG PO TABS: 25.0000 mg | ORAL_TABLET | Freq: Every day | ORAL | Status: DC

## 2022-12-18 MED ORDER — LACTATED RINGERS IV BOLUS
500.0000 mL | Freq: Once | INTRAVENOUS | Status: AC
  Administered 2022-12-18: 500 mL via INTRAVENOUS

## 2022-12-18 MED ORDER — UMECLIDINIUM BROMIDE 62.5 MCG/ACT IN AEPB
1.0000 | INHALATION_SPRAY | Freq: Every day | RESPIRATORY_TRACT | Status: DC
  Filled 2022-12-18 (×2): qty 7

## 2022-12-18 MED ORDER — PREDNISONE 20 MG PO TABS: 40.0000 mg | ORAL_TABLET | Freq: Every day | ORAL | 0 refills | Status: AC

## 2022-12-18 NOTE — Progress Notes (Signed)
RT came to assess the patient for Bipap needs. Upon arrival he was being transported to 2W17. On nasal cannula with no respiratory distress noted. The Bipap was not sent with patient. The floor RT was notified and informed of patient arrival.

## 2022-12-18 NOTE — Plan of Care (Signed)

## 2022-12-18 NOTE — Discharge Summary (Addendum)
Name: Derek Patterson MRN: [id number] DOB: 1956-02-15 67 y.o. PCP: [name], MD  Date of Admission: 12/17/2022  9:29 PM Date of Discharge: 12/18/2022 2:23PM Attending Physician: [name], MD  Discharge Diagnosis: 1. Principal Problem:   COPD with acute exacerbation ([place]) Active Problems:   Essential hypertension   Tobacco use disorder   Solitary pulmonary nodule Acute hypercapnic respiratory failure  Discharge Medications: Allergies as of 12/18/2022   No Known Allergies      Medication List     TAKE these medications    albuterol 108 (90 Base) MCG/ACT inhaler Commonly known as: VENTOLIN HFA INHALE 1-2 PUFFS INTO THE LUNGS EVERY 4 HOURS FOR WHEEZING OR SHORTNESS OF BREATH What changed: See the new instructions.   aspirin EC 81 MG tablet Take 81 mg by mouth daily.   CENTRUM SILVER 50+MEN PO Take 1 tablet by mouth daily.   fluticasone 50 MCG/ACT nasal spray Commonly known as: FLONASE Place 1 spray into both nostrils daily.   hydrochlorothiazide 25 MG tablet Commonly known as: HYDRODIURIL TAKE 1 TABLET BY MOUTH EVERY DAY   nicotine 7 mg/24hr patch Commonly known as: NICODERM CQ - dosed in mg/24 hr Place 1 patch (7 mg total) onto the skin daily.   predniSONE 20 MG tablet Commonly known as: DELTASONE Take 2 tablets (40 mg total) by mouth daily at 8 pm for 2 days.   tamsulosin 0.4 MG Caps capsule Commonly known as: FLOMAX Take 1 capsule (0.4 mg total) by mouth daily after supper. TAKE 1 CAPSULE BY MOUTH EVERY DAY AFTER SUPPER What changed: additional instructions   Trelegy Ellipta 100-62.5-25 MCG/ACT Aepb Generic drug: Fluticasone-Umeclidin-Vilant Inhale 1 puff into the lungs daily.        Disposition and follow-up:   Derek Patterson was discharged from [facility] in Good condition.  At the hospital follow up visit please address:  1.  COPD exacerbation -recommend pulmonary rehab given multiple exacerbations and on triple  therapy -will need to continue tobacco cessation  CKD II -repeat BMP  2.  Labs / imaging needed at time of follow-up: BMP  3.  Pending labs/ test needing follow-up: NA  Follow-up Appointments: Encounter Information  Provider [place]  12/27/2022 9:15 AM [name], MD [facility]  Please call [phone] to get in touch with the [facility] clinic if the above time does not work for your hospital follow up  Hospital Course by problem list:  Acute hypercapnic respiratory failure Chronic obstructive pulmonary disease exacerbation Tobacco use Patient has history of COPD resulting in two prior hospitalizations within the past 25 months. He currently smokes about 2-3 cigarettes per day, which is less than his previous of 0.5ppd. Pulmonary function testing performed 01-2020 demonstrated evidence of both restrictive and obstructive lung disease, FEV1/FVC was 0.62. Takes albuterol as needed and Trelegy daily at home. Presented on 1-7 with sudden-onset breath shortness after possible exposure to hairspray products. Also reports eating a big meal and laying down just before his hypoxic event which could indicate GERD as a trigger. Chest radiograph negative for acute pathology, only showing emphysema. Laboratory testing demonstrated pH 7.27 and pCO2 69 Albuterol was administered and patient placed on BiPAP resulting in symptomatic improvement. Weaned to RA within 2 hours. Repeat VBG with improvement of pH to 7.43 and pCO2 to 37. Saturated >92% on RA and while ambulating. Day of discharge the patient had normal wob on RA, LCTAB but some diminished breath sounds L>R. Already on  triple therapy but will need pulmonary rehab after discharge.Continued Fluticasone-umeclidinium-vilanterol 100-62.5-25ug q24. Started Prednisone '40mg'$  q24 for 3 days to be completed at home on discharge. Did not start antibiotics given afebrile, negative RPP,minimal WBC elevation to 10.9 with  resolution to 5.4, no infectious symptoms. Encouraged tobacco cessation.  Chest pain Tachycardia Patient had some diffuse chest pain that resolved with BiPAP. Troponins all wnl and EKG with no new ST/T wave or other ischemic changes. Low suspicion for PE given low pre-test probability, no evidence of DVT on exam and also how rapid symptom resolution was. Patient was noted to be tachycardic while ambulating. Likely secondary to deconditioning. Will need pulmonary rehab.   Chronic kidney disease II Patient has history of CKD II with baseline GFR around 60 and creatinine 1.0-1.3. Upon arrival creatinine was around baseline at 1.37. Creatinine down-trended at discharge to 1.18 s/p Lactated ringer bolus 565m.  Hypertension Continued Hydrochlorothiazide '25mg'$  q24   Cerebral arteriovenous malformation Continued Aspirin '81mg'$  q24   Benign prostatic hyperplasia Continued Tamsulosin 0.'4mg'$  q24  Subjective: Does not know what brought him to the hospital. Was getting ready to eat and felt like his stomach swole up and congested. Felt breath getting short, just got out of the shower. Was watching football. Breath just started getting short. Chest pain started occurring all over. Felt like he needed to burp. 2.5L on O2, moved down to 0, 95%. Says wife was using straight iron think. Thought he had gas, which caused stomach issues. Reordered troponins this AM, last night was 4.   Just eating and then laying down, getting ready to eat again. Smokes about 2-3 cigarretes. Does not use oxygen at home. Has not been able to ambulate since arrival to the ED. Breathing is a lot better now. Chest pain alleviated with BiPAP Discharge Exam:   BP 130/85 (BP Location: Right Arm)   Pulse 96   Temp 98.1 F (36.7 C) (Oral)   Resp 16   Ht '5\' 11"'$  (1.803 m)   Wt 64 kg   SpO2 92%   BMI 19.68 kg/m  Discharge exam:  Gen: Awake and alert, no acute distress, laying in bed cooperative and pleasant CV:RRR, normal s1/s2, no  m/r/g, 2+ bilateral radial and DP pulses Pulm: normal wob on RA, L>R diminished lung sounds diffusely, no crackles or wheezing Abd: soft,NT,ND Extremities: warm, dry, no LED edema  Pertinent Labs, Studies, and Procedures:     Latest Ref Rng & Units 12/18/2022    4:18 AM 12/18/2022    3:50 AM 12/17/2022    9:55 PM  CBC  WBC 4.0 - 10.5 K/uL  5.4    Hemoglobin 13.0 - 17.0 g/dL 16.0  15.4  17.3   Hematocrit 39.0 - 52.0 % 47.0  45.8  51.0   Platelets 150 - 400 K/uL  249        Latest Ref Rng & Units 12/18/2022    4:18 AM 12/18/2022    3:50 AM 12/17/2022    9:55 PM  BMP  Glucose 70 - 99 mg/dL  164  134   BUN 8 - 23 mg/dL  21  26   Creatinine 0.61 - 1.24 mg/dL  1.18  1.30   Sodium 135 - 145 mmol/L 136  135  139   Potassium 3.5 - 5.1 mmol/L 3.9  3.8  3.8   Chloride 98 - 111 mmol/L  99  97   CO2 22 - 32 mmol/L  26    Calcium 8.9 - 10.3 mg/dL  9.3     DG Chest Port 1 View  Result Date: 12/17/2022 CLINICAL DATA:  Shortness of breath EXAM: PORTABLE CHEST 1 VIEW COMPARISON:  07/18/2022 FINDINGS: The heart size and mediastinal contours are within normal limits. Hyperinflation. Bibasilar atelectasis/scarring. No focal consolidation, pleural effusion, or pneumothorax. No acute osseous abnormality. IMPRESSION: No active disease.  Emphysema. Electronically Signed   By: Placido Sou M.D.   On: 12/17/2022 21:59      Discharge Instructions: You presented with chest pain, shortness of breath and confusion which is though to be due to a COPD exacerbation from aerosolized hair products. Laying down after eating can also trigger this due to reflux. You should avoid laying down directly after eating and should avoid coming in contact with sprays such as hair products and cleaners. You were started on a steroid called prednisone in the hospital. Your breathing improved. You will need to take the rest of the prednisone at home. You will take  '40mg'$  of prednisone(two '20mg'$  tablets) on Tuesday 12/19/22 and '40mg'$  of  prednisone(two '20mg'$  tablets) on Wednesday 12/20/22. You are scheduled to see  Dr. Simeon Craft 12/27/2022. She will discuss pulmonary rehab to help with your breathing at this time. It was a pleasure taking care of you in the hospital.  Signed: Iona Coach, MD 12/18/2022, 2:22 PM   Pager: Iona Coach, MD Internal Medicine Resident, PGY-1 [facility]  Pager: [phone]

## 2022-12-18 NOTE — ED Provider Notes (Signed)
Care assumed from Dr. Mammie Lorenzo, patient with COPD exacerbation initially requiring CPAP and then BiPAP now off of BiPAP but still hypoxic on room air and needs admission.  I discussed the case with Dr. Alfonse Spruce of internal medicine teaching service who agrees to admit the patient.   Delora Fuel, MD 61/51/83 [phone]

## 2022-12-18 NOTE — Progress Notes (Signed)
SATURATION QUALIFICATIONS: (This note is used to comply with regulatory documentation for home oxygen)  Patient Saturations on Room Air at Rest = 93%  Patient Saturations on Room Air while Ambulating = 95%  Patient Saturations on 0 Liters of oxygen while Ambulating = N/A  Pts heart rate sustained in mid 120s while ambulating

## 2022-12-18 NOTE — Hospital Course (Addendum)
-  yesterday evening SOB -Was watching football, get ready to eat -no sick contact. -Cough w clear sputum. Use albuterol  -use trelergy daily. Not on home o2 -Mild chest pain, comes and go sometimes. Not worse last few days -Eating and drinking ok -No diarrhea, N/V  -Smoke 2-3 ciag/d from 1/2p/d -Alcohol: years ago. Dont drink now -Occasional marijuana  -Live w wife -  12/18/22;  Does not know what brought him to the hospital. Was getting ready to eat and felt like his stomach swole up and congested. Felt breath getting short, just got out of the shower. Was watching football. Breath just started getting short. Chest pain started occurring all over. Felt like he needed to burp. 2.5L on O2, moved down to 0, 95%. Says wife was using straight iron think. Thought he had gas, which caused stomach issues. Reordered troponins this AM, last night was 4.   Just eating and then laying down, getting ready to eat again. Smokes about 2-3 cigarretes. Does not use oxygen at home. Has not been able to ambulate since arrival to the ED. Breathing is a lot better now. Chest pain alleviated with BiPAP  Decreased breath sounds on the L>R, no wheezing, normal wob

## 2022-12-18 NOTE — H&P (Signed)
Date: 12/18/2022               Patient Name:  Derek Patterson MRN: [id number]  DOB: [birth year] Age / Sex: 67 y.o., male   PCP: [name], MD         Medical Service: Internal Medicine Teaching Service         Attending Physician: Dr. [name], MD    First Contact: Drucie Opitz, MD      Pager: Bethann Goo [phone]      Second Contact: Christiana Fuchs, DO      Pager: [place] Dec [phone]           After Hours (After 5p/  First Contact Pager: [phone]  weekends / holidays): Second Contact Pager: [phone]   SUBJECTIVE   Chief Complaint: breath shortness   History of Present Illness:  Derek Patterson is a 68 year old male with a past medical history of chronic obstructive pulmonary disease, tobacco use, chronic kidney disease II, hypertension, and cerebral arteriovenous malformation who presents with acute breath shortness.   Patient developed acute-onset breath shortness yesterday 1-7 evening without an obvious cause. At the time, he was watching football while preparing soup. Some of his family members were using hair products, which the patient may have inhaled. He normally has an intermittent productive cough plus mild chest pain that have remained stable in both severity and frequency over the last few days. Continues to use breathing medications including Trelegy once per day and albuterol as needed. Over the last few days, he has been using albuterol more often than usual. Eating and drinking well without any issues. Denies recent illnesses, fever, chills, sweats, nausea, vomiting, diarrhea, abdominal pain, and sick contacts.   History notable for smoking tobacco, currently 2-3 cigarettes per day down from about 0.5ppd, and marijuana but only occasionally.    ED Course: Upon arrival to the ED, vitals were RR 29 and BP 160/86. Laboratory testing demonstrated Gluc 139, Cr 1.37, AST 48, GFR 57, WBC 10.9, and Bicarb 31.2. Chest radiograph negative for acute disease, only showing  emphysema. Albuterol as administered in the ED and patient was placed on BiPAP.   Meds:  Current Meds  Medication Sig   albuterol (VENTOLIN HFA) 108 (90 Base) MCG/ACT inhaler INHALE 1-2 PUFFS INTO THE LUNGS EVERY 4 HOURS FOR WHEEZING OR SHORTNESS OF BREATH (Patient taking differently: Inhale 1-2 puffs into the lungs every 4 (four) hours as needed for wheezing or shortness of breath.)   aspirin 81 MG EC tablet Take 81 mg by mouth daily.   Fluticasone-Umeclidin-Vilant (TRELEGY ELLIPTA) 100-62.5-25 MCG/ACT AEPB Inhale 1 puff into the lungs daily.   hydrochlorothiazide (HYDRODIURIL) 25 MG tablet TAKE 1 TABLET BY MOUTH EVERY DAY (Patient taking differently: Take 25 mg by mouth daily.)   Multiple Vitamins-Minerals (CENTRUM SILVER 50+MEN PO) Take 1 tablet by mouth daily.   tamsulosin (FLOMAX) 0.4 MG CAPS capsule Take 1 capsule (0.4 mg total) by mouth daily after supper. TAKE 1 CAPSULE BY MOUTH EVERY DAY AFTER SUPPER (Patient taking differently: Take 0.4 mg by mouth daily after supper.)    Past Medical History  Past Surgical History:  Procedure Laterality Date   COLONOSCOPY  08/11/2009   IR ANGIO INTRA EXTRACRAN SEL COM CAROTID INNOMINATE BILAT MOD SED  04/14/2021   IR ANGIO VERTEBRAL SEL SUBCLAVIAN INNOMINATE BILAT MOD SED  04/14/2021   IR US GUIDE VASC ACCESS RIGHT  04/14/2021   POLYPECTOMY       Social:  Lives With: wife Support: family Level of  Function: independent PCP: [name] MD Substances: cigarettes 2-3 per day, occasional marijuana, previously drank alcohol, denies cocaine and heroin use    Family History:   Family History  Problem Relation Age of Onset   Heart attack Mother    Stroke Mother    Hypertension Mother    Diabetes Mother    Heart attack Father    Stroke Father    Hypertension Father    Hypertension Brother    Rheumatic fever Brother    Colon cancer Neg Hx    Colon polyps Neg Hx    Esophageal cancer Neg Hx    Stomach cancer Neg Hx    Rectal  cancer Neg Hx       Allergies: Allergies as of 12/17/2022   (No Known Allergies)      Review of Systems: A complete ROS was negative except as per HPI.    OBJECTIVE:   Physical Exam: Blood pressure 125/72, pulse 93, temperature 97.7 F (36.5 C), resp. rate 20, height '5\' 11"'$  (1.803 m), weight 64 kg, SpO2 95 %.   General:      awake and alert, lying uncomfortably in bed, diaphoretic, cooperative, not in acute distress Skin:       warm and dry Eyes:      extraocular movements intact, no periorbital swelling or scleral icterus Lungs:      normal respiratory effort, breathing unlabored, symmetrical chest rise, no obvious wheezing  Cardiac:      regular rate and rhythm Neurologic:      oriented to person-place-time, moving all extremities, no gross focal deficits Psychiatric:      irritable mood with congruent affect, declined further questioning partway through encounter and refused thorough exam, intelligible speech   Labs: CBC    Component Value Date/Time   WBC 10.9 (H) 12/17/2022 2130   RBC 5.81 12/17/2022 2130   HGB 17.3 (H) 12/17/2022 2155   HGB 16.4 03/13/2022 1140   HCT 51.0 12/17/2022 2155   HCT 47.5 03/13/2022 1140   PLT 275 12/17/2022 2130   PLT 314 03/13/2022 1140   MCV 84.3 12/17/2022 2130   MCV 81 03/13/2022 1140   MCH 27.9 12/17/2022 2130   MCHC 33.1 12/17/2022 2130   RDW 13.8 12/17/2022 2130   RDW 13.5 03/13/2022 1140   LYMPHSABS 7.4 (H) 12/17/2022 2130   MONOABS 0.7 12/17/2022 2130   EOSABS 0.1 12/17/2022 2130   BASOSABS 0.1 12/17/2022 2130     CMP     Component Value Date/Time   NA 139 12/17/2022 2155   NA 143 03/13/2022 1140   K 3.8 12/17/2022 2155   CL 97 (L) 12/17/2022 2155   CO2 28 12/17/2022 2130   GLUCOSE 134 (H) 12/17/2022 2155   BUN 26 (H) 12/17/2022 2155   BUN 18 03/13/2022 1140   CREATININE 1.30 (H) 12/17/2022 2155   CREATININE 1.15 12/22/2014 1702   CALCIUM 9.1 12/17/2022 2130   CALCIUM 10.0 10/15/2008 1346   PROT 7.0  12/17/2022 2130   PROT 7.0 03/13/2022 1140   ALBUMIN 4.0 12/17/2022 2130   ALBUMIN 4.7 03/13/2022 1140   AST 48 (H) 12/17/2022 2130   ALT 27 12/17/2022 2130   ALKPHOS 94 12/17/2022 2130   BILITOT 1.0 12/17/2022 2130   BILITOT 0.5 03/13/2022 1140   GFRNONAA 57 (L) 12/17/2022 2130   GFRNONAA 70 12/22/2014 1702   GFRAA 68 06/07/2020 1450   GFRAA 81 12/22/2014 1702     Imaging:  DG Chest Port 1 View Result Date:  12/17/2022 IMPRESSION: No active disease.  Emphysema.    ECG: My personal interpretation is right bundle branch block, which is unchanged from prior ECG on 07-19-2022.    ASSESSMENT & PLAN:   Assessment & Plan by Problem: Principal Problem:   COPD with acute exacerbation (HCC) Active Problems:   Essential hypertension   Tobacco use disorder   Solitary pulmonary nodule   Derek Patterson is a 67 year old male with a past medical history of chronic obstructive pulmonary disease, tobacco use, chronic kidney disease II, hypertension, and cerebral arteriovenous malformation who presents with acute breath shortness.    ---Chronic obstructive pulmonary disease exacerbation ---Tobacco use Patient has history of COPD resulting in two prior hospitalizations within the past 25 months. He currently smokes about 2-3 cigarettes per day, which is less than his previous of 0.5ppd. Pulmonary function testing performed 01-2020 demonstrated evidence of both restrictive and obstructive lung disease, FEV1/FVC was 0.62. Takes albuterol as needed and Trelegy daily at home. Presented on 1-7 with sudden-onset breath shortness after possible exposure to hairspray products. Upon arrival, vitals notable for tachypnea and hypoxia. Chest radiograph negative for acute pathology, only showing emphysema. Laboratory testing demonstrated pH 7.27 and pCO2 69. Albuterol was administered and patient placed on BiPAP resulting in symptomatic improvement, now comfortably breathing room air. Given COPD history, etiology of  symptoms is likely an acute exacerbation secondary to irritant exposure.  > Fluticasone-umeclidinium-vilanterol 100-62.5-25ug q24 > Prednisone '40mg'$  q24, last dose 1-10 > Ipratropium-albuterol 0.5-2.'5mg'$  q4 PRN > Supplemental oxygen as needed to maintain SpO2 88-92% > Encourage tobacco cessation   ---Chronic kidney disease II Patient has history of CKD II with baseline GFR around 60 and creatinine 1.0-1.3. Upon arrival creatinine very slightly elevated to 1.37, which may alternatively represent patient's current baseline.  > Lactated ringer bolus 553m > Trend BMP q24 > Avoid nephrotoxic agents   ---Hypertension Patient has history of hypertension managed at home with hydrochlorothiazide. Upon arrival, blood pressure was elevated to 160/86 but has since returned to normal range. > Hydrochlorothiazide '25mg'$  q24   ---Cerebral arteriovenous malformation Patient has history of arteriovenous malformation identified by head CT in 2020. He takes aspirin at home. > Aspirin '81mg'$  q24   ---Benign prostatic hyperplasia Patient has history of benign prostatic hyperplasia diagnosed in 2014 and managed at home with tamsulosin. > Tamsulosin 0.'4mg'$  q24      Diet: Normal VTE: Enoxaparin IVF: LR,Bolus Code: Full  Prior to Admission Living Arrangement: Home, living with spouse Anticipated Discharge Location: Home Barriers to Discharge: medical management, symptom improvement  Dispo: Admit patient to Observation with expected length of stay less than 2 midnights.  Signed: HSerita Butcher MD Internal Medicine Resident PGY-1  12/18/2022, 2:48 AM
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
## 2022-12-18 NOTE — Discharge Instructions (Addendum)
You presented with chest pain, shortness of breath and confusion which is though to be due to a COPD exacerbation from aerosolized hair products. Laying down after eating can also trigger this due to reflux. You should avoid laying down directly after eating and should avoid coming in contact with sprays such as hair products and cleaners. You were started on a steroid called prednisone in the hospital. Your breathing improved. You will need to take the rest of the prednisone at home. You will take  '40mg'$  of prednisone(two '20mg'$  tablets) on Tuesday 12/19/22 and '40mg'$  of prednisone(two '20mg'$  tablets) on Wednesday 12/20/22. You are scheduled to see  Dr. Simeon Craft 12/27/2022. She will discuss pulmonary rehab to help with your breathing at this time. It was a pleasure taking care of you in the hospital.

## 2022-12-19 NOTE — Telephone Encounter (Signed)
Next appt scheduled 1/17/2 with PCP.

## 2022-12-20 ENCOUNTER — Telehealth: Payer: Self-pay

## 2022-12-20 NOTE — Patient Outreach (Signed)
  Care Coordination TOC Note Transition Care Management Follow-up Telephone Call Date of discharge and from where: Derek Patterson 12/18/22 How have you been since you were released from the hospital? "I am doing okay, can even walk more today" Any questions or concerns? No  Items Reviewed: Did the pt receive and understand the discharge instructions provided? Yes  Medications obtained and verified? Yes  Other? No  Any new allergies since your discharge? No  Dietary orders reviewed? Yes Do you have support at home? Yes   Home Care and Equipment/Supplies: Were home health services ordered? not applicable If so, what is the name of the agency? N/a  Has the agency set up a time to come to the patient's home? not applicable Were any new equipment or medical supplies ordered?  No What is the name of the medical supply agency? N/a Were you able to get the supplies/equipment? not applicable Do you have any questions related to the use of the equipment or supplies? No  Functional Questionnaire: (I = Independent and D = Dependent) ADLs: I  Bathing/Dressing- I  Meal Prep- I  Eating- I  Maintaining continence- I  Transferring/Ambulation- I  Managing Meds- I  Follow up appointments reviewed:  PCP Hospital f/u appt confirmed? Yes  Scheduled to see Dr. Simeon Craft on 12/27/22 @ 0915. [facility] f/u appt confirmed? No   Are transportation arrangements needed? No  If their condition worsens, is the pt aware to call PCP or go to the Emergency Dept.? Yes Was the patient provided with contact information for the PCP's office or ED? Yes Was to pt encouraged to call back with questions or concerns? Yes  SDOH assessments and interventions completed:   Yes SDOH Interventions Today    Flowsheet Row Most Recent Value  SDOH Interventions   Housing Interventions Intervention Not Indicated  Transportation Interventions Intervention Not Indicated       Care Coordination  Interventions:  No Care Coordination interventions needed at this time.   Encounter Outcome:  Pt. Visit Completed

## 2022-12-22 DIAGNOSIS — Z79899 Other long term (current) drug therapy: Secondary | ICD-10-CM | POA: Diagnosis not present

## 2022-12-22 DIAGNOSIS — I10 Essential (primary) hypertension: Secondary | ICD-10-CM | POA: Diagnosis not present

## 2022-12-22 DIAGNOSIS — F17210 Nicotine dependence, cigarettes, uncomplicated: Secondary | ICD-10-CM | POA: Diagnosis not present

## 2022-12-22 DIAGNOSIS — J309 Allergic rhinitis, unspecified: Secondary | ICD-10-CM | POA: Diagnosis not present

## 2022-12-22 DIAGNOSIS — J449 Chronic obstructive pulmonary disease, unspecified: Secondary | ICD-10-CM | POA: Diagnosis not present

## 2022-12-22 DIAGNOSIS — I700 Atherosclerosis of aorta: Secondary | ICD-10-CM | POA: Diagnosis not present

## 2022-12-22 DIAGNOSIS — R39198 Other difficulties with micturition: Secondary | ICD-10-CM | POA: Diagnosis not present

## 2022-12-27 ENCOUNTER — Encounter: Payer: 59 | Admitting: Student

## 2023-02-05 ENCOUNTER — Other Ambulatory Visit: Payer: Self-pay

## 2023-02-05 ENCOUNTER — Emergency Department (HOSPITAL_COMMUNITY)
Admission: EM | Admit: 2023-02-05 | Discharge: 2023-02-05 | Disposition: A | Discharge status: Home / Self Care | Payer: 59 | Attending: Emergency Medicine | Admitting: Emergency Medicine

## 2023-02-05 DIAGNOSIS — Z7982 Long term (current) use of aspirin: Secondary | ICD-10-CM | POA: Insufficient documentation

## 2023-02-05 DIAGNOSIS — R39198 Other difficulties with micturition: Secondary | ICD-10-CM | POA: Diagnosis present

## 2023-02-05 DIAGNOSIS — N320 Bladder-neck obstruction: Secondary | ICD-10-CM | POA: Insufficient documentation

## 2023-02-05 LAB — URINALYSIS, ROUTINE W REFLEX MICROSCOPIC
Bilirubin Urine: NEGATIVE
Glucose, UA: NEGATIVE mg/dL
Ketones, ur: NEGATIVE mg/dL
Leukocytes,Ua: NEGATIVE
Nitrite: NEGATIVE
Protein, ur: NEGATIVE mg/dL
RBC / HPF: >50 RBC/hpf (ref 0–5)
Specific Gravity, Urine: 1.012 (ref 1.005–1.030)
pH: 6 (ref 5.0–8.0)

## 2023-02-05 NOTE — Discharge Instructions (Signed)
Please follow-up with urologist in the office.  Please return for inability urinate or if you develop a fever or pain in your side.

## 2023-02-05 NOTE — ED Triage Notes (Signed)
PT came in [place] for urinary retention. Pt had not voided since last night and didn't feel like he had fully emptied since Saturday. PT is A&O

## 2023-02-05 NOTE — ED Provider Notes (Signed)
[facility] Provider Note   CSN: [id number] Arrival date & time: 02/05/23  [id number]     History  No chief complaint on file.   Derek Patterson is a 67 y.o. male.  67 yo M with a chief complaints of difficulty urinating.  He says for the past couple days he still he has had to really strain to get urine out and since last night feels like he cannot get anything out at all.  He denies any fevers or chills.  He did recently have a dental extraction and is on new medicine because of the.  Has had some difficulty urinating in the past and is on tamsulosin.        Home Medications Prior to Admission medications   Medication Sig Start Date End Date Taking? Authorizing Provider  albuterol (VENTOLIN HFA) 108 (90 Base) MCG/ACT inhaler INHALE 1-2 PUFFS into THE lungs EVERY 4 HOURS FOR FOR WHEEZING OR SHORTNESS OF BREATH 12/19/22   [name], MD  aspirin 81 MG EC tablet Take 81 mg by mouth daily.    [provider]  fluticasone (FLONASE) 50 MCG/ACT nasal spray Place 1 spray into both nostrils daily. Patient not taking: Reported on 12/17/2022 03/13/22 03/13/23  Harvie Heck, MD  Fluticasone-Umeclidin-Vilant (TRELEGY ELLIPTA) 100-62.5-25 MCG/ACT AEPB Inhale 1 puff into the lungs daily. 08/03/22 08/03/23  [name], MD  hydrochlorothiazide (HYDRODIURIL) 25 MG tablet TAKE 1 TABLET BY MOUTH EVERY DAY Patient taking differently: Take 25 mg by mouth daily. 08/15/22   [name], MD  Multiple Vitamins-Minerals (CENTRUM SILVER 50+MEN PO) Take 1 tablet by mouth daily.    [provider]  nicotine (NICODERM CQ - DOSED IN MG/24 HR) 7 mg/24hr patch Place 1 patch (7 mg total) onto the skin daily. Patient not taking: Reported on 07/19/2022 12/21/20   [name], DO  tamsulosin (FLOMAX) 0.4 MG CAPS capsule Take 1 capsule (0.4 mg total) by mouth daily after supper. TAKE 1 CAPSULE BY MOUTH EVERY DAY AFTER SUPPER Patient  taking differently: Take 0.4 mg by mouth daily after supper. 09/08/22 09/08/23  [name], MD      Allergies    Patient has no known allergies.    Review of Systems   Review of Systems  Physical Exam Updated Vital Signs BP 127/76   Pulse 65   Temp 98 F (36.7 C) (Oral)   Resp 16   SpO2 100%  Physical Exam Vitals and nursing note reviewed.  Constitutional:      Appearance: He is well-developed.  HENT:     Head: Normocephalic and atraumatic.  Eyes:     Pupils: Pupils are equal, round, and reactive to light.  Neck:     Vascular: No JVD.  Cardiovascular:     Rate and Rhythm: Normal rate and regular rhythm.     Heart sounds: No murmur heard.    No friction rub. No gallop.  Pulmonary:     Effort: No respiratory distress.     Breath sounds: No wheezing.  Abdominal:     General: There is no distension.     Tenderness: There is no abdominal tenderness. There is no guarding or rebound.  Musculoskeletal:        General: Normal range of motion.     Cervical back: Normal range of motion and neck supple.  Skin:    Coloration: Skin is not pale.     Findings: No rash.  Neurological:  Mental Status: He is alert and oriented to person, place, and time.  Psychiatric:        Behavior: Behavior normal.     ED Results / Procedures / Treatments   Labs (all labs ordered are listed, but only abnormal results are displayed) Labs Reviewed  URINALYSIS, ROUTINE W REFLEX MICROSCOPIC - Abnormal; Notable for the following components:      Result Value   Hgb urine dipstick LARGE (*)    Bacteria, UA RARE (*)    Non Squamous Epithelial 0-5 (*)    All other components within normal limits    EKG None  Radiology No results found.  Procedures Procedures    Medications Ordered in ED Medications - No data to display  ED Course/ Medical Decision Making/ A&P                             Medical Decision Making Amount and/or Complexity of Data Reviewed Labs:  ordered.   34 yoM with a chief complaints of difficulty urinating.  Sounds like bladder outlet obstruction by history though no obvious palpable bladder on exam.  Will obtain a bladder scan.  Bladder scan with greater than 500 cc urine.  Foley placed.  Patient feeling better after Foley placement.  UA independently turbid by me negative for infection.  Will discharge home.  Urology follow-up.  10:54 AM:  I have discussed the diagnosis/risks/treatment options with the patient.  Evaluation and diagnostic testing in the emergency department does not suggest an emergent condition requiring admission or immediate intervention beyond what has been performed at this time.  They will follow up with PCP. We also discussed returning to the ED immediately if new or worsening sx occur. We discussed the sx which are most concerning (e.g., sudden worsening pain, fever, inability to tolerate by mouth) that necessitate immediate return. Medications administered to the patient during their visit and any new prescriptions provided to the patient are listed below.  Medications given during this visit Medications - No data to display   The patient appears reasonably screen and/or stabilized for discharge and I doubt any other medical condition or other [facility] requiring further screening, evaluation, or treatment in the ED at this time prior to discharge.          Final Clinical Impression(s) / ED Diagnoses Final diagnoses:  Bladder outlet obstruction    Rx / DC Orders ED Discharge Orders     None         [name], DO 02/05/23 1054

## 2023-02-09 ENCOUNTER — Other Ambulatory Visit: Payer: Self-pay | Admitting: Student

## 2023-02-09 ENCOUNTER — Telehealth: Payer: Self-pay

## 2023-02-09 NOTE — Telephone Encounter (Signed)
     Patient  visit on 2/23  at [facility]    Have you been able to follow up with your primary care physician? Yes   The patient was or was not able to obtain any needed medicine or equipment. Yes   Are there diet recommendations that you are having difficulty following? Na   Patient expresses understanding of discharge instructions and education provided has no other needs at this time.  Yes      Derek Patterson [facility] Guide, [place] [phone] [address], [place], [place] [address] Phone: [phone] Email: [email or web address]    

## 2023-02-19 NOTE — Telephone Encounter (Signed)
Patient has transferred care to [facility].  Information is documented on the Appt Desk screen under comments.

## 2023-02-23 ENCOUNTER — Encounter (HOSPITAL_COMMUNITY): Payer: Self-pay | Admitting: Emergency Medicine

## 2023-02-23 ENCOUNTER — Emergency Department (HOSPITAL_COMMUNITY)
Admission: EM | Admit: 2023-02-23 | Discharge: 2023-02-23 | Disposition: A | Discharge status: Home / Self Care | Payer: 59 | Attending: Emergency Medicine | Admitting: Emergency Medicine

## 2023-02-23 ENCOUNTER — Other Ambulatory Visit: Payer: Self-pay

## 2023-02-23 DIAGNOSIS — R339 Retention of urine, unspecified: Secondary | ICD-10-CM | POA: Insufficient documentation

## 2023-02-23 DIAGNOSIS — Z7982 Long term (current) use of aspirin: Secondary | ICD-10-CM | POA: Diagnosis not present

## 2023-02-23 LAB — URINALYSIS, W/ REFLEX TO CULTURE (INFECTION SUSPECTED)
Bilirubin Urine: NEGATIVE
Glucose, UA: NEGATIVE mg/dL
Hgb urine dipstick: NEGATIVE
Ketones, ur: NEGATIVE mg/dL
Leukocytes,Ua: NEGATIVE
Nitrite: NEGATIVE
Protein, ur: NEGATIVE mg/dL
Specific Gravity, Urine: 1.008 (ref 1.005–1.030)
pH: 6 (ref 5.0–8.0)

## 2023-02-23 NOTE — ED Triage Notes (Addendum)
Pt states he had foley catheter for 3 weeks and had it removed this morning. Voided 2-3 times since but has not voided in hours with the urge to.

## 2023-02-23 NOTE — Discharge Instructions (Addendum)
You had a Foley catheter placed in the emergency department.  Your urinalysis does not appear to show signs of a urinary tract infection.  Please follow-up with your urologist as scheduled.  Return to the emergency department if you experience any of the following: Flank pain, fevers, urinary retention, or any other concerning symptoms.

## 2023-02-23 NOTE — ED Provider Triage Note (Signed)
Emergency Medicine Provider Triage Evaluation Note  Derek Patterson , a 67 y.o. male  was evaluated in triage.  Pt complains of urinary retention.  Was seen here recently and had a Foley catheter placed.  Followed up with urology today who removed the catheter.  He was able to urinate a couple times after that but has been unable to urinate for the past 3 to 4 hours.  He does feel the urge to urinate.  He was seen at [facility] urology.  He denies fevers or chills.  Reports some suprapubic tenderness.  Was told today to double his dose of Flomax at home  Review of Systems  Positive: As above Negative: As above  Physical Exam  BP (!) 158/102 (BP Location: Right Arm)   Pulse 91   Temp 98.5 F (36.9 C) (Oral)   Resp 16   Ht 5\' 11"  (1.803 m)   Wt 63.5 kg   SpO2 96%   BMI 19.53 kg/m  Gen:   Awake, no distress   Resp:  Normal effort  MSK:   Moves extremities without difficulty  Other:  Mild suprapubic TTP  Medical Decision Making  Medically screening exam initiated at 4:59 PM.  Appropriate orders placed.  DAUSON LAWMAN was informed that the remainder of the evaluation will be completed by another provider, this initial triage assessment does not replace that evaluation, and the importance of remaining in the ED until their evaluation is complete.  Bladder scan and urinalysis ordered   Roylene Reason, PA-C 02/23/23 1700

## 2023-02-23 NOTE — ED Notes (Addendum)
Patient bladder scanned 519ml, MD made aware

## 2023-02-23 NOTE — ED Provider Notes (Signed)
[facility] Provider Note   CSN: [id number] Arrival date & time: 02/23/23  1646     History  Chief Complaint  Patient presents with   Urinary Retention    Derek Patterson is a 67 y.o. male.  67 year old male with a history of urinary retention who presents to the emergency department with urinary retention.  For the past 3 weeks has had a Foley catheter in place due to urinary retention.  Went to the urologist today who removed the catheter but he has been unable to void since.  Afterwards started developing being suprapubic pressure and discomfort and came into the emergency department to have Foley catheter inserted.  No recent fevers, flank pains, vomiting.  Is already taking Flomax.  Did complete a course of antibiotics last week.  Has follow-up with urology on Friday.       Home Medications Prior to Admission medications   Medication Sig Start Date End Date Taking? Authorizing Provider  albuterol (VENTOLIN HFA) 108 (90 Base) MCG/ACT inhaler INHALE 1-2 PUFFS into THE lungs EVERY 4 HOURS FOR FOR WHEEZING OR SHORTNESS OF BREATH 12/19/22   [name], MD  aspirin 81 MG EC tablet Take 81 mg by mouth daily.    [provider]  fluticasone (FLONASE) 50 MCG/ACT nasal spray Place 1 spray into both nostrils daily. Patient not taking: Reported on 12/17/2022 03/13/22 03/13/23  Harvie Heck, MD  Fluticasone-Umeclidin-Vilant (TRELEGY ELLIPTA) 100-62.5-25 MCG/ACT AEPB Inhale 1 puff into the lungs daily. 08/03/22 08/03/23  [name], MD  hydrochlorothiazide (HYDRODIURIL) 25 MG tablet TAKE 1 TABLET BY MOUTH EVERY DAY 02/13/23   [name], MD  Multiple Vitamins-Minerals (CENTRUM SILVER 50+MEN PO) Take 1 tablet by mouth daily.    [provider]  nicotine (NICODERM CQ - DOSED IN MG/24 HR) 7 mg/24hr patch Place 1 patch (7 mg total) onto the skin daily. Patient not taking: Reported on 07/19/2022 12/21/20   [name], DO  tamsulosin (FLOMAX) 0.4 MG CAPS capsule Take 1 capsule (0.4 mg total) by mouth daily after supper. TAKE 1 CAPSULE BY MOUTH EVERY DAY AFTER SUPPER Patient taking differently: Take 0.4 mg by mouth daily after supper. 09/08/22 09/08/23  [name], MD      Allergies    Patient has no known allergies.    Review of Systems   Review of Systems  Physical Exam Updated Vital Signs BP (!) 158/102 (BP Location: Right Arm)   Pulse 91   Temp 98.5 F (36.9 C) (Oral)   Resp 16   Ht 5\' 11"  (1.803 m)   Wt 63.5 kg   SpO2 96%   BMI 19.53 kg/m  Physical Exam Vitals and nursing note reviewed.  Constitutional:      General: He is not in acute distress.    Appearance: He is well-developed.  HENT:     Head: Normocephalic and atraumatic.  Eyes:     Conjunctiva/sclera: Conjunctivae normal.  Cardiovascular:     Rate and Rhythm: Normal rate and regular rhythm.  Pulmonary:     Effort: Pulmonary effort is normal. No respiratory distress.  Abdominal:     General: There is no distension.     Palpations: Abdomen is soft. There is no mass.     Tenderness: There is abdominal tenderness (Mild suprapubic). There is no right CVA tenderness, left CVA tenderness or guarding.  Musculoskeletal:        General: No swelling.     Cervical back: Neck  supple.  Skin:    General: Skin is warm and dry.     Capillary Refill: Capillary refill takes less than 2 seconds.  Neurological:     Mental Status: He is alert.  Psychiatric:        Mood and Affect: Mood normal.     ED Results / Procedures / Treatments   Labs (all labs ordered are listed, but only abnormal results are displayed) Labs Reviewed  URINALYSIS, W/ REFLEX TO CULTURE (INFECTION SUSPECTED) - Abnormal; Notable for the following components:      Result Value   Bacteria, UA RARE (*)    All other components within normal limits    EKG None  Radiology No results found.  Procedures Procedures   Medications Ordered in  ED Medications - No data to display  ED Course/ Medical Decision Making/ A&P                             Medical Decision Making  Derek Patterson is a 67 y.o. male with comorbidities that complicate the patient evaluation including urinary retention who presents emergency department with urinary retention  Initial Ddx:  BPH, UTI, bladder outlet obstruction  MDM:  Feel the patient likely has urinary retention due to BPH.  No symptoms consistent with UTI at this time but will send off urine for analysis.  Postvoid residual did show over 500 mL of urine so we will place a Foley catheter.  Plan:  Insert Foley catheter UA  ED Summary/Re-evaluation:  UA not consistent with UTI.  Will have patient follow-up with urology in 1 week as scheduled.  This patient presents to the ED for concern of complaints listed in HPI, this involves an extensive number of treatment options, and is a complaint that carries with it a high risk of complications and morbidity. Disposition including potential need for admission considered.   Dispo: DC Home. Return precautions discussed including, but not limited to, those listed in the AVS. Allowed pt time to ask questions which were answered fully prior to dc.  Additional history obtained from spouse Records reviewed Outpatient Clinic Notes The following labs were independently interpreted: Urinalysis and show no acute abnormality I have reviewed the patients home medications and made adjustments as needed  Final Clinical Impression(s) / ED Diagnoses Final diagnoses:  Urinary retention    Rx / DC Orders ED Discharge Orders     None         Fransico Meadow, MD 02/23/23 2024

## 2023-02-27 ENCOUNTER — Telehealth: Payer: Self-pay

## 2023-02-27 NOTE — Telephone Encounter (Signed)
     Patient  visit on 3/15  at [facility]   Have you been able to follow up with your primary care physician? Yes   The patient was or was not able to obtain any needed medicine or equipment. Yes   Are there diet recommendations that you are having difficulty following? Na   Patient expresses understanding of discharge instructions and education provided has no other needs at this time.  Yes     [facility] [phone] [address], [place], [place] [address] Phone: [phone] Email: [email or web address]

## 2023-03-02 ENCOUNTER — Other Ambulatory Visit: Payer: Self-pay | Admitting: Student

## 2023-03-02 DIAGNOSIS — J449 Chronic obstructive pulmonary disease, unspecified: Secondary | ICD-10-CM

## 2023-03-05 ENCOUNTER — Other Ambulatory Visit: Payer: Self-pay | Admitting: Student

## 2023-05-06 ENCOUNTER — Inpatient Hospital Stay (HOSPITAL_COMMUNITY)
Admission: EM | Admit: 2023-05-06 | Discharge: 2023-05-08 | DRG: 190 | Disposition: A | Discharge status: Home / Self Care | Payer: 59 | Attending: Internal Medicine | Admitting: Internal Medicine

## 2023-05-06 ENCOUNTER — Other Ambulatory Visit: Payer: Self-pay

## 2023-05-06 DIAGNOSIS — K219 Gastro-esophageal reflux disease without esophagitis: Secondary | ICD-10-CM | POA: Diagnosis present

## 2023-05-06 DIAGNOSIS — Z7951 Long term (current) use of inhaled steroids: Secondary | ICD-10-CM

## 2023-05-06 DIAGNOSIS — E876 Hypokalemia: Secondary | ICD-10-CM | POA: Diagnosis present

## 2023-05-06 DIAGNOSIS — Z8249 Family history of ischemic heart disease and other diseases of the circulatory system: Secondary | ICD-10-CM

## 2023-05-06 DIAGNOSIS — F32A Depression, unspecified: Secondary | ICD-10-CM | POA: Diagnosis present

## 2023-05-06 DIAGNOSIS — N182 Chronic kidney disease, stage 2 (mild): Secondary | ICD-10-CM | POA: Diagnosis present

## 2023-05-06 DIAGNOSIS — K7689 Other specified diseases of liver: Secondary | ICD-10-CM | POA: Diagnosis present

## 2023-05-06 DIAGNOSIS — Z823 Family history of stroke: Secondary | ICD-10-CM

## 2023-05-06 DIAGNOSIS — Z7982 Long term (current) use of aspirin: Secondary | ICD-10-CM

## 2023-05-06 DIAGNOSIS — Z833 Family history of diabetes mellitus: Secondary | ICD-10-CM

## 2023-05-06 DIAGNOSIS — J9601 Acute respiratory failure with hypoxia: Secondary | ICD-10-CM | POA: Diagnosis present

## 2023-05-06 DIAGNOSIS — F17200 Nicotine dependence, unspecified, uncomplicated: Secondary | ICD-10-CM | POA: Diagnosis present

## 2023-05-06 DIAGNOSIS — I10 Essential (primary) hypertension: Secondary | ICD-10-CM | POA: Diagnosis present

## 2023-05-06 DIAGNOSIS — R0902 Hypoxemia: Secondary | ICD-10-CM

## 2023-05-06 DIAGNOSIS — J432 Centrilobular emphysema: Secondary | ICD-10-CM | POA: Diagnosis present

## 2023-05-06 DIAGNOSIS — Z79899 Other long term (current) drug therapy: Secondary | ICD-10-CM

## 2023-05-06 DIAGNOSIS — J441 Chronic obstructive pulmonary disease with (acute) exacerbation: Principal | ICD-10-CM | POA: Diagnosis present

## 2023-05-06 DIAGNOSIS — E860 Dehydration: Secondary | ICD-10-CM | POA: Diagnosis present

## 2023-05-06 DIAGNOSIS — I4510 Unspecified right bundle-branch block: Secondary | ICD-10-CM | POA: Diagnosis present

## 2023-05-06 DIAGNOSIS — M1990 Unspecified osteoarthritis, unspecified site: Secondary | ICD-10-CM | POA: Diagnosis present

## 2023-05-06 DIAGNOSIS — N179 Acute kidney failure, unspecified: Secondary | ICD-10-CM | POA: Diagnosis present

## 2023-05-06 DIAGNOSIS — N400 Enlarged prostate without lower urinary tract symptoms: Secondary | ICD-10-CM | POA: Diagnosis present

## 2023-05-06 DIAGNOSIS — F17210 Nicotine dependence, cigarettes, uncomplicated: Secondary | ICD-10-CM | POA: Diagnosis present

## 2023-05-06 DIAGNOSIS — Z1152 Encounter for screening for COVID-19: Secondary | ICD-10-CM

## 2023-05-06 DIAGNOSIS — I1310 Hypertensive heart and chronic kidney disease without heart failure, with stage 1 through stage 4 chronic kidney disease, or unspecified chronic kidney disease: Secondary | ICD-10-CM | POA: Diagnosis present

## 2023-05-07 ENCOUNTER — Emergency Department (HOSPITAL_COMMUNITY): Payer: 59

## 2023-05-07 DIAGNOSIS — Z823 Family history of stroke: Secondary | ICD-10-CM | POA: Diagnosis not present

## 2023-05-07 DIAGNOSIS — F17210 Nicotine dependence, cigarettes, uncomplicated: Secondary | ICD-10-CM | POA: Diagnosis present

## 2023-05-07 DIAGNOSIS — Z1152 Encounter for screening for COVID-19: Secondary | ICD-10-CM | POA: Diagnosis not present

## 2023-05-07 DIAGNOSIS — F32A Depression, unspecified: Secondary | ICD-10-CM | POA: Diagnosis present

## 2023-05-07 DIAGNOSIS — Z7951 Long term (current) use of inhaled steroids: Secondary | ICD-10-CM | POA: Diagnosis not present

## 2023-05-07 DIAGNOSIS — Z7982 Long term (current) use of aspirin: Secondary | ICD-10-CM | POA: Diagnosis not present

## 2023-05-07 DIAGNOSIS — N179 Acute kidney failure, unspecified: Secondary | ICD-10-CM | POA: Diagnosis present

## 2023-05-07 DIAGNOSIS — N400 Enlarged prostate without lower urinary tract symptoms: Secondary | ICD-10-CM | POA: Diagnosis present

## 2023-05-07 DIAGNOSIS — E860 Dehydration: Secondary | ICD-10-CM | POA: Diagnosis present

## 2023-05-07 DIAGNOSIS — J441 Chronic obstructive pulmonary disease with (acute) exacerbation: Secondary | ICD-10-CM | POA: Diagnosis not present

## 2023-05-07 DIAGNOSIS — K7689 Other specified diseases of liver: Secondary | ICD-10-CM | POA: Diagnosis present

## 2023-05-07 DIAGNOSIS — K219 Gastro-esophageal reflux disease without esophagitis: Secondary | ICD-10-CM | POA: Diagnosis present

## 2023-05-07 DIAGNOSIS — F17200 Nicotine dependence, unspecified, uncomplicated: Secondary | ICD-10-CM | POA: Diagnosis not present

## 2023-05-07 DIAGNOSIS — M1990 Unspecified osteoarthritis, unspecified site: Secondary | ICD-10-CM | POA: Diagnosis present

## 2023-05-07 DIAGNOSIS — N182 Chronic kidney disease, stage 2 (mild): Secondary | ICD-10-CM | POA: Diagnosis present

## 2023-05-07 DIAGNOSIS — I1310 Hypertensive heart and chronic kidney disease without heart failure, with stage 1 through stage 4 chronic kidney disease, or unspecified chronic kidney disease: Secondary | ICD-10-CM | POA: Diagnosis present

## 2023-05-07 DIAGNOSIS — Z79899 Other long term (current) drug therapy: Secondary | ICD-10-CM | POA: Diagnosis not present

## 2023-05-07 DIAGNOSIS — Z833 Family history of diabetes mellitus: Secondary | ICD-10-CM | POA: Diagnosis not present

## 2023-05-07 DIAGNOSIS — I10 Essential (primary) hypertension: Secondary | ICD-10-CM | POA: Diagnosis not present

## 2023-05-07 DIAGNOSIS — Z8249 Family history of ischemic heart disease and other diseases of the circulatory system: Secondary | ICD-10-CM | POA: Diagnosis not present

## 2023-05-07 DIAGNOSIS — J9601 Acute respiratory failure with hypoxia: Secondary | ICD-10-CM | POA: Diagnosis present

## 2023-05-07 DIAGNOSIS — J432 Centrilobular emphysema: Secondary | ICD-10-CM | POA: Diagnosis present

## 2023-05-07 DIAGNOSIS — E876 Hypokalemia: Secondary | ICD-10-CM | POA: Diagnosis present

## 2023-05-07 DIAGNOSIS — I4510 Unspecified right bundle-branch block: Secondary | ICD-10-CM | POA: Diagnosis present

## 2023-05-07 LAB — BASIC METABOLIC PANEL
Anion gap: 9 (ref 5–15)
BUN: 16 mg/dL (ref 8–23)
CO2: 28 mmol/L (ref 22–32)
Calcium: 8.9 mg/dL (ref 8.9–10.3)
Chloride: 102 mmol/L (ref 98–111)
Creatinine, Ser: 1.43 mg/dL — ABNORMAL HIGH (ref 0.61–1.24)
GFR, Estimated: 54 mL/min — ABNORMAL LOW (ref >60)
Glucose, Bld: 110 mg/dL — ABNORMAL HIGH (ref 70–99)
Potassium: 3.5 mmol/L (ref 3.5–5.1)
Sodium: 139 mmol/L (ref 135–145)

## 2023-05-07 LAB — CBC WITH DIFFERENTIAL/PLATELET
Abs Immature Granulocytes: 0.01 10*3/uL (ref 0.00–0.07)
Basophils Absolute: 0 10*3/uL (ref 0.0–0.1)
Basophils Relative: 0 %
Eosinophils Absolute: 0 10*3/uL (ref 0.0–0.5)
Eosinophils Relative: 0 %
HCT: 45.9 % (ref 39.0–52.0)
Hemoglobin: 14.6 g/dL (ref 13.0–17.0)
Immature Granulocytes: 0 %
Lymphocytes Relative: 13 %
Lymphs Abs: 0.8 10*3/uL (ref 0.7–4.0)
MCH: 27.3 pg (ref 26.0–34.0)
MCHC: 31.8 g/dL (ref 30.0–36.0)
MCV: 85.8 fL (ref 80.0–100.0)
Monocytes Absolute: 0.1 10*3/uL (ref 0.1–1.0)
Monocytes Relative: 2 %
Neutro Abs: 5.5 10*3/uL (ref 1.7–7.7)
Neutrophils Relative %: 85 %
Platelets: 252 10*3/uL (ref 150–400)
RBC: 5.35 MIL/uL (ref 4.22–5.81)
RDW: 14.5 % (ref 11.5–15.5)
WBC: 6.4 10*3/uL (ref 4.0–10.5)
nRBC: 0 % (ref 0.0–0.2)

## 2023-05-07 MED ORDER — LACTATED RINGERS IV SOLN: INTRAVENOUS | Status: AC

## 2023-05-07 MED ORDER — IOHEXOL 350 MG/ML SOLN
50.0000 mL | Freq: Once | INTRAVENOUS | Status: AC | PRN
  Administered 2023-05-07: 50 mL via INTRAVENOUS

## 2023-05-07 MED ORDER — ENOXAPARIN SODIUM 40 MG/0.4ML IJ SOSY
40.0000 mg | PREFILLED_SYRINGE | INTRAMUSCULAR | Status: DC
  Administered 2023-05-07: 40 mg via SUBCUTANEOUS
  Filled 2023-05-07: qty 0.4

## 2023-05-07 MED ORDER — IPRATROPIUM-ALBUTEROL 0.5-2.5 (3) MG/3ML IN SOLN
3.0000 mL | Freq: Once | RESPIRATORY_TRACT | Status: AC
  Administered 2023-05-07: 3 mL via RESPIRATORY_TRACT
  Filled 2023-05-07: qty 3

## 2023-05-07 MED ORDER — IPRATROPIUM-ALBUTEROL 0.5-2.5 (3) MG/3ML IN SOLN
3.0000 mL | Freq: Two times a day (BID) | RESPIRATORY_TRACT | Status: DC
  Administered 2023-05-08: 3 mL via RESPIRATORY_TRACT
  Filled 2023-05-07: qty 3

## 2023-05-07 MED ORDER — AZITHROMYCIN 250 MG PO TABS
250.0000 mg | ORAL_TABLET | Freq: Every day | ORAL | Status: DC
  Administered 2023-05-08: 250 mg via ORAL
  Filled 2023-05-07: qty 1

## 2023-05-07 MED ORDER — IPRATROPIUM-ALBUTEROL 0.5-2.5 (3) MG/3ML IN SOLN
3.0000 mL | Freq: Four times a day (QID) | RESPIRATORY_TRACT | Status: DC
  Administered 2023-05-07 (×3): 3 mL via RESPIRATORY_TRACT
  Filled 2023-05-07 (×3): qty 3

## 2023-05-07 MED ORDER — FINASTERIDE 5 MG PO TABS
5.0000 mg | ORAL_TABLET | Freq: Every day | ORAL | Status: DC
  Administered 2023-05-07 – 2023-05-08 (×2): 5 mg via ORAL
  Filled 2023-05-07 (×2): qty 1

## 2023-05-07 MED ORDER — AZITHROMYCIN 250 MG PO TABS
500.0000 mg | ORAL_TABLET | Freq: Once | ORAL | Status: AC
  Administered 2023-05-07: 500 mg via ORAL
  Filled 2023-05-07: qty 2

## 2023-05-07 MED ORDER — POLYETHYLENE GLYCOL 3350 17 G PO PACK: 17.0000 g | PACK | Freq: Every day | ORAL | Status: DC | PRN

## 2023-05-07 MED ORDER — FLUTICASONE FUROATE-VILANTEROL 100-25 MCG/ACT IN AEPB
1.0000 | INHALATION_SPRAY | Freq: Every day | RESPIRATORY_TRACT | Status: DC
  Administered 2023-05-08: 1 via RESPIRATORY_TRACT
  Filled 2023-05-07: qty 28

## 2023-05-07 MED ORDER — GUAIFENESIN 100 MG/5ML PO LIQD: 5.0000 mL | ORAL | Status: DC | PRN

## 2023-05-07 MED ORDER — ACETAMINOPHEN 500 MG PO TABS: 1000.0000 mg | ORAL_TABLET | Freq: Four times a day (QID) | ORAL | Status: DC | PRN

## 2023-05-07 MED ORDER — TAMSULOSIN HCL 0.4 MG PO CAPS
0.4000 mg | ORAL_CAPSULE | Freq: Every day | ORAL | Status: DC
  Administered 2023-05-07: 0.4 mg via ORAL
  Filled 2023-05-07: qty 1

## 2023-05-07 MED ORDER — ADULT MULTIVITAMIN W/MINERALS CH
1.0000 | ORAL_TABLET | Freq: Every day | ORAL | Status: DC
  Administered 2023-05-07 – 2023-05-08 (×2): 1 via ORAL
  Filled 2023-05-07 (×2): qty 1

## 2023-05-07 MED ORDER — UMECLIDINIUM BROMIDE 62.5 MCG/ACT IN AEPB
1.0000 | INHALATION_SPRAY | Freq: Every day | RESPIRATORY_TRACT | Status: DC
  Administered 2023-05-08: 1 via RESPIRATORY_TRACT
  Filled 2023-05-07: qty 7

## 2023-05-07 MED ORDER — MELATONIN 5 MG PO TABS: 5.0000 mg | ORAL_TABLET | Freq: Every evening | ORAL | Status: DC | PRN

## 2023-05-07 MED ORDER — PROCHLORPERAZINE EDISYLATE 10 MG/2ML IJ SOLN: 5.0000 mg | Freq: Four times a day (QID) | INTRAMUSCULAR | Status: DC | PRN

## 2023-05-07 MED ORDER — ASPIRIN 81 MG PO TBEC
81.0000 mg | DELAYED_RELEASE_TABLET | Freq: Every day | ORAL | Status: DC
  Administered 2023-05-07 – 2023-05-08 (×2): 81 mg via ORAL
  Filled 2023-05-07 (×2): qty 1

## 2023-05-07 MED ORDER — METHYLPREDNISOLONE SODIUM SUCC 40 MG IJ SOLR
40.0000 mg | Freq: Every day | INTRAMUSCULAR | Status: DC
  Administered 2023-05-07 – 2023-05-08 (×2): 40 mg via INTRAVENOUS
  Filled 2023-05-07 (×2): qty 1

## 2023-05-07 MED ORDER — NICOTINE 14 MG/24HR TD PT24
14.0000 mg | MEDICATED_PATCH | Freq: Every day | TRANSDERMAL | Status: DC
  Filled 2023-05-07 (×2): qty 1

## 2023-05-07 NOTE — Progress Notes (Signed)
Patient transfer from ED,alert and oriented,able to verbalize needs.No complain of pain,assessment is done ,patient made comfortable in room,bed in low position and call light in reach.

## 2023-05-07 NOTE — Plan of Care (Signed)
  Problem: Clinical Measurements: Goal: Respiratory complications will improve Outcome: Progressing   Problem: Activity: Goal: Risk for activity intolerance will decrease Outcome: Progressing   Problem: Coping: Goal: Level of anxiety will decrease Outcome: Progressing   Problem: Pain Managment: Goal: General experience of comfort will improve Outcome: Progressing   Problem: Safety: Goal: Ability to remain free from injury will improve Outcome: Progressing   
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
## 2023-05-07 NOTE — Evaluation (Signed)
Physical Therapy Evaluation Patient Details Name: Derek Patterson MRN: [id number] DOB: [birth year] Today's Date: 05/07/2023  History of Present Illness  Derek Patterson is a 67 y.o. male who presented to [facility] ED from home via EMS with complaints of worsening progressive dyspnea over the past few days. PMH: current tobacco user, COPD, hypertension, BPH, h/o polysubstance abuse   Clinical Impression  Pt in good spirits and cooperative. Pt able to ambulate without AD however SpO2 dec to 87% on RA within first 20 ' of amb. Pt placed on 2Lo2 via [place], SpO2 90-91%. Pt with coughing spell during ambulation with +sputum. Pt reports 2/4 DOE with ambulation, noted SOB. Pt with good home set up and support. Acute PT to follow in hospital to monitor ambulation and SPO2 saturation however will not need further PT follow up upon d/c.     SATURATION QUALIFICATIONS: (This note is used to comply with regulatory documentation for home oxygen)  Patient Saturations on Room Air at Rest = 93%  Patient Saturations on Room Air while Ambulating = 87%  Patient Saturations on 2 Liters of oxygen while Ambulating = 91%  Please briefly explain why patient needs home oxygen: unable to maintain SpO2 >88% on RA during amb      Recommendations for follow up therapy are one component of a multi-disciplinary discharge planning process, led by the attending physician.  Recommendations may be updated based on patient status, additional functional criteria and insurance authorization.  Follow Up Recommendations       Assistance Recommended at Discharge Intermittent Supervision/Assistance  Patient can return home with the following       Equipment Recommendations None recommended by PT  Recommendations for Other Services       Functional Status Assessment       Precautions / Restrictions Precautions Precautions: Other (comment) Precaution Comments: watch SpO2 Restrictions Other Position/Activity Restrictions: no       Mobility  Bed Mobility Overal bed mobility: Independent             General bed mobility comments: no difficulty, HOB elevated    Transfers Overall transfer level: Independent Equipment used: None               General transfer comment: no difficulty    Ambulation/Gait Ambulation/Gait assistance: Min guard Gait Distance (Feet): 160 Feet Assistive device: Rolling walker (2 wheels), None Gait Pattern/deviations: Step-through pattern, Decreased stride length Gait velocity: wfl     General Gait Details: started with RW for energy conservation however pt pushing heavily through UEs, transitioned to no AD, no LOB. Pt with coughing spell with +sputum. Pt SpO2 droped to 87% on RA within 20' of ambulation, 2lo2 via [place] placed on pt, SpO2 90-91% on 2LO2 via [place]  Stairs            Wheelchair Mobility    Modified Rankin (Stroke Patients Only)       Balance Overall balance assessment: Modified Independent                                           Pertinent Vitals/Pain Pain Assessment Pain Assessment: No/denies pain (but then onset of chest pain with coughing but dissipates after coughing over)    Home Living Family/patient expects to be discharged to:: Private residence Living Arrangements: Spouse/significant other Available Help at Discharge: Family;Available PRN/intermittently Type of Home: House Home Access: Stairs to  enter Entrance Stairs-Rails: Can reach both Entrance Stairs-Number of Steps: 4   Home Layout: One level Home Equipment: None      Prior Function Prior Level of Function : Independent/Modified Independent                     Hand Dominance   Dominant Hand: Right    Extremity/Trunk Assessment   Upper Extremity Assessment Upper Extremity Assessment: Overall WFL for tasks assessed    Lower Extremity Assessment Lower Extremity Assessment: Overall WFL for tasks assessed    Cervical / Trunk  Assessment Cervical / Trunk Assessment: Normal  Communication   Communication: No difficulties  Cognition Arousal/Alertness: Awake/alert Behavior During Therapy: WFL for tasks assessed/performed Overall Cognitive Status: Within Functional Limits for tasks assessed                                          General Comments General comments (skin integrity, edema, etc.): SpO2 93-94% on RA at rest, 87% on RA with amb, 90-91% on 2LO2 via [place] during amb, HR inc to 122bpm during coughing spell    Exercises     Assessment/Plan    PT Assessment Patient needs continued PT services  PT Problem List Decreased strength;Decreased activity tolerance;Decreased mobility;Cardiopulmonary status limiting activity       PT Treatment Interventions DME instruction;Gait training;Stair training;Functional mobility training;Therapeutic activities;Therapeutic exercise;Balance training    PT Goals (Current goals can be found in the Care Plan section)  Acute Rehab PT Goals Patient Stated Goal: improve breathing PT Goal Formulation: With patient Time For Goal Achievement: 05/21/23 Potential to Achieve Goals: Good Additional Goals Additional Goal #1: Pt to score >19 on DGI to indicate minimal falls risk.    Frequency Min 2X/week     Co-evaluation               AM-PAC PT "6 Clicks" Mobility  Outcome Measure Help needed turning from your back to your side while in a flat bed without using bedrails?: None Help needed moving from lying on your back to sitting on the side of a flat bed without using bedrails?: None Help needed moving to and from a bed to a chair (including a wheelchair)?: None Help needed standing up from a chair using your arms (e.g., wheelchair or bedside chair)?: None Help needed to walk in hospital room?: A Little Help needed climbing 3-5 steps with a railing? : A Little 6 Click Score: 22    End of Session Equipment Utilized During Treatment: Gait  belt Activity Tolerance: Patient tolerated treatment well Patient left: in chair;with call bell/phone within reach Nurse Communication: Mobility status (Spo2) PT Visit Diagnosis: Difficulty in walking, not elsewhere classified (R26.2)    Time: 4098-1191 PT Time Calculation (min) (ACUTE ONLY): 26 min   Charges:   PT Evaluation $PT Eval Moderate Complexity: 1 Mod PT Treatments $Gait Training: 8-22 mins        Lewis Shock, PT, DPT Acute Rehabilitation Services Secure chat preferred Office #: [phone]   Iona Hansen 05/07/2023, 3:48 PM

## 2023-05-07 NOTE — ED Provider Notes (Signed)
[facility] EMERGENC[facility] DEPARTMENT AT [facility] Provider Note   CSN: [id number] Arrival date & time: 05/06/23  2344     History  Chief Complaint  Patient presents with   Shortness of Breath    Patient to ED via EMS with complaint of shortness of breath. EMS reports finding patient 84% on room air. Patient was given 1 duoneb, 125 mg solumedrol and 2 grams of IV Magnesium sulfate in route. Patient arrives on non rebreather O2 15 liters.    Derek Patterson is a 67 y.o. male.   Shortness of Breath   Patient is a 67 year old male with past medical history significant for COPD who is currently smoking not on any oxygen and has never required oxygen he has had some more coughing over the past 2 months has had some more productive cough for the past month.  No fevers at home.  He states he has had progressively worsening dyspnea and cough over the past month which collated has significant dyspnea over the past day ultimately requiring him to present to the emergency room via an ambulance.  He was given 2 g of magnesium, Solu-Medrol, albuterol and Atrovent.  No recent surgeries, hospitalization, long travel, hemoptysis, estrogen containing OCP, cancer history.  No unilateral leg swelling.  No history of PE or VTE.       Home Medications Prior to Admission medications   Medication Sig Start Date End Date Taking? Authorizing Provider  albuterol (VENTOLIN HFA) 108 (90 Base) MCG/ACT inhaler INHALE 1-2 PUFFS into THE lungs EVERY 4 HOURS FOR FOR WHEEZING OR SHORTNESS OF BREATH 12/19/22   Morene Crocker, MD  aspirin 81 MG EC tablet Take 81 mg by mouth daily.    [provider]  fluticasone (FLONASE) 50 MCG/ACT nasal spray Place 1 spray into both nostrils daily. Patient not taking: Reported on 12/17/2022 03/13/22 03/13/23  Eliezer Bottom, MD  Fluticasone-Umeclidin-Vilant (TRELEGY ELLIPTA) 100-62.5-25 MCG/ACT AEPB Inhale 1 puff into the lungs daily. 08/03/22 08/03/23  Morene Crocker, MD  hydrochlorothiazide (HYDRODIURIL) 25 MG tablet TAKE 1 TABLET BY MOUTH EVERY DAY 02/13/23   Morene Crocker, MD  Multiple Vitamins-Minerals (CENTRUM SILVER 50+MEN PO) Take 1 tablet by mouth daily.    [provider]  nicotine (NICODERM CQ - DOSED IN MG/24 HR) 7 mg/24hr patch Place 1 patch (7 mg total) onto the skin daily. Patient not taking: Reported on 07/19/2022 12/21/20   Guinevere Scarlet A, DO  tamsulosin (FLOMAX) 0.4 MG CAPS capsule Take 1 capsule (0.4 mg total) by mouth daily after supper. TAKE 1 CAPSULE BY MOUTH EVERY DAY AFTER SUPPER Patient taking differently: Take 0.4 mg by mouth daily after supper. 09/08/22 09/08/23  Morene Crocker, MD      Allergies    Patient has no known allergies.    Review of Systems   Review of Systems  Respiratory:  Positive for shortness of breath.     Physical Exam Updated Vital Signs BP 108/68   Pulse 77   Temp 97.6 F (36.4 C) (Oral)   Resp (!) 22   Ht 5\' 10"  (1.778 m)   Wt 63.5 kg   SpO2 95%   BMI 20.09 kg/m  Physical Exam Vitals and nursing note reviewed.  Constitutional:      General: He is not in acute distress.    Appearance: He is ill-appearing.     Comments: Chronically unwell appearing 67 year old male, pleasant, able answer questions appropriately follow commands  HENT:     Head: Normocephalic and  atraumatic.     Nose: Nose normal.     Mouth/Throat:     Mouth: Mucous membranes are moist.  Eyes:     General: No scleral icterus. Cardiovascular:     Rate and Rhythm: Normal rate and regular rhythm.     Pulses: Normal pulses.     Heart sounds: Normal heart sounds.  Pulmonary:     Effort: Pulmonary effort is normal. No respiratory distress.     Breath sounds: No wheezing.     Comments: Faint end expiratory wheeze, not tachypneic, speaking full sentences Abdominal:     Palpations: Abdomen is soft.     Tenderness: There is no abdominal tenderness. There is no guarding or rebound.  Musculoskeletal:      Cervical back: Normal range of motion.     Right lower leg: No edema.     Left lower leg: No edema.  Skin:    General: Skin is warm and dry.     Capillary Refill: Capillary refill takes less than 2 seconds.  Neurological:     Mental Status: He is alert. Mental status is at baseline.  Psychiatric:        Mood and Affect: Mood normal.        Behavior: Behavior normal.     ED Results / Procedures / Treatments   Labs (all labs ordered are listed, but only abnormal results are displayed) Labs Reviewed  BASIC METABOLIC PANEL - Abnormal; Notable for the following components:      Result Value   Glucose, Bld 110 (*)    Creatinine, Ser 1.43 (*)    GFR, Estimated 54 (*)    All other components within normal limits  CBC WITH DIFFERENTIAL/PLATELET  HIV ANTIBODY (ROUTINE TESTING W REFLEX)  CBC  CREATININE, SERUM    EKG EKG Interpretation  Date/Time:  Sunday May 06 2023 23:51:20 EDT Ventricular Rate:  80 PR Interval:  199 QRS Duration: 123 QT Interval:  419 QTC Calculation: 484 R Axis:   61 Text Interpretation: Sinus rhythm Consider left atrial enlargement Right bundle branch block ST elevation, consider inferior injury When compared with ECG of 12/18/2022, No significant change was found Confirmed by Dione Booze ([id number]) on 05/06/2023 11:54:52 PM  Radiology CT Angio Chest PE W/Cm &/Or Wo Cm  Result Date: 05/07/2023 CLINICAL DATA:  Shortness of breath and chest pain. EXAM: CT ANGIOGRAPHY CHEST WITH CONTRAST TECHNIQUE: Multidetector CT imaging of the chest was performed using the standard protocol during bolus administration of intravenous contrast. Multiplanar CT image reconstructions and MIPs were obtained to evaluate the vascular anatomy. RADIATION DOSE REDUCTION: This exam was performed according to the departmental dose-optimization program which includes automated exposure control, adjustment of the mA and/or kV according to patient size and/or use of iterative reconstruction  technique. CONTRAST:  50mL OMNIPAQUE IOHEXOL 350 MG/ML SOLN COMPARISON:  July 19, 2022 FINDINGS: Cardiovascular: There is mild calcification of the aortic arch, without evidence of aortic aneurysm. Satisfactory opacification of the pulmonary arteries to the segmental level. No evidence of pulmonary embolism. Normal heart size. No pericardial effusion. Mediastinum/Nodes: No enlarged mediastinal, hilar, or axillary lymph nodes. Thyroid gland, trachea, and esophagus demonstrate no significant findings. Lungs/Pleura: There is marked severity centrilobular emphysematous lung disease. Mild, stable scarring and/or atelectasis is seen within the right lower lobe. Mild posterior left basilar atelectasis is also noted. The 5 mm lung nodule seen within the posterior aspect of the left upper lobe on the prior study is no longer visualized. There is no evidence  of a pleural effusion or pneumothorax. Upper Abdomen: Numerous subcentimeter cystic appearing structures are seen scattered throughout the liver. Musculoskeletal: No chest wall abnormality. No acute or significant osseous findings. Review of the MIP images confirms the above findings. IMPRESSION: 1. No evidence of pulmonary embolism. 2. Marked severity centrilobular emphysematous lung disease. 3. Mild, stable right lower lobe scarring and/or atelectasis. 4. Mild posterior left basilar atelectasis. 5. Findings likely consistent with numerous hepatic cysts. Correlation with nonemergent hepatic ultrasound is recommended. 6. Aortic atherosclerosis. Aortic Atherosclerosis (ICD10-I70.0) and Emphysema (ICD10-J43.9). Electronically Signed   By: Aram Candela M.D.   On: 05/07/2023 03:54   DG Chest 2 View  Result Date: 05/07/2023 CLINICAL DATA:  Productive cough, shortness of breath. EXAM: CHEST - 2 VIEW COMPARISON:  12/17/2022. FINDINGS: The heart size and mediastinal contours are within normal limits. Emphysematous changes are present in the lungs. No consolidation,  effusion, or pneumothorax. No acute osseous abnormality. IMPRESSION: 1. No active cardiopulmonary disease. 2. Emphysema. Electronically Signed   By: Thornell Sartorius M.D.   On: 05/07/2023 01:26    Procedures .Critical Care  Performed by: Gailen Shelter, PA Authorized by: Gailen Shelter, PA   Critical care provider statement:    Critical care time (minutes):  35   Critical care time was exclusive of:  Separately billable procedures and treating other patients and teaching time   Critical care was necessary to treat or prevent imminent or life-threatening deterioration of the following conditions:  Respiratory failure (Respiratory failure with hypoxia)   Critical care was time spent personally by me on the following activities:  Development of treatment plan with patient or surrogate, review of old charts, re-evaluation of patient's condition, pulse oximetry, ordering and review of radiographic studies, ordering and review of laboratory studies, ordering and performing treatments and interventions, obtaining history from patient or surrogate, examination of patient and evaluation of patient's response to treatment   Care discussed with: admitting provider       Medications Ordered in ED Medications  azithromycin (ZITHROMAX) tablet 500 mg (has no administration in time range)  acetaminophen (TYLENOL) tablet 1,000 mg (has no administration in time range)  enoxaparin (LOVENOX) injection 40 mg (has no administration in time range)  ipratropium-albuterol (DUONEB) 0.5-2.5 (3) MG/3ML nebulizer solution 3 mL (3 mLs Nebulization Given 05/07/23 0134)  iohexol (OMNIPAQUE) 350 MG/ML injection 50 mL (50 mLs Intravenous Contrast Given 05/07/23 0336)    ED Course/ Medical Decision Making/ A&P Clinical Course as of 05/07/23 0457  Mon May 07, 2023  0048 Cough for years, sputum for the last month. Using trelegy consistently.  [WF]  0049 Last prednisone was January.  [WF]    Clinical Course User Index [WF]  Gailen Shelter, [place]                             Medical Decision Making Amount and/or Complexity of Data Reviewed Labs: ordered. Radiology: ordered.  Risk Prescription drug management. Decision regarding hospitalization.   This patient presents to the ED for concern of SOB, this involves a number of treatment options, and is a complaint that carries with it a high risk of complications and morbidity. A differential diagnosis was considered for the patient's symptoms which is discussed below:   The causes for shortness of breath include but are not limited to Cardiac (AHF, pericardial effusion and tamponade, arrhythmias, ischemia, etc) Respiratory (COPD, asthma, pneumonia, pneumothorax, primary pulmonary hypertension, PE/VQ mismatch) Hematological (anemia) Neuromuscular (  ALS, Guillain-Barr, etc)    Co morbidities: Discussed in HPI   Brief History:  Patient is a 67 year old male with past medical history significant for COPD who is currently smoking not on any oxygen and has never required oxygen he has had some more coughing over the past 2 months has had some more productive cough for the past month.  No fevers at home.  He states he has had progressively worsening dyspnea and cough over the past month which collated has significant dyspnea over the past day ultimately requiring him to present to the emergency room via an ambulance.  He was given 2 g of magnesium, Solu-Medrol, albuterol and Atrovent.  No recent surgeries, hospitalization, long travel, hemoptysis, estrogen containing OCP, cancer history.  No unilateral leg swelling.  No history of PE or VTE.     EMR reviewed including pt PMHx, past surgical history and past visits to ER.   See HPI for more details   Lab Tests:   I personally reviewed all laboratory work and imaging. Metabolic panel without any acute abnormality specifically kidney function within normal limits and no significant electrolyte abnormalities.  CBC without leukocytosis or significant anemia. Creatinine grossly at baseline seems to have some variability with his baseline  Imaging Studies:  NAD. I personally reviewed all imaging studies and no acute abnormality found. I agree with radiology interpretation. No acute emergent disease on imaging IMPRESSION:  1. No evidence of pulmonary embolism.  2. Marked severity centrilobular emphysematous lung disease.  3. Mild, stable right lower lobe scarring and/or atelectasis.  4. Mild posterior left basilar atelectasis.  5. Findings likely consistent with numerous hepatic cysts.  Correlation with nonemergent hepatic ultrasound is recommended.  6. Aortic atherosclerosis.    Aortic Atherosclerosis (ICD10-I70.0) and Emphysema (ICD10-J43.9).      Electronically Signed    By: Aram Candela M.D.    On: 05/07/2023 03:54    Cardiac Monitoring:  The patient was maintained on a cardiac monitor.  I personally viewed and interpreted the cardiac monitored which showed an underlying rhythm of:  NSR EKG non-ischemic   Medicines ordered:  I ordered medication including DuoNeb for hypoxia/wheezing Reevaluation of the patient after these medicines showed that the patient improved I have reviewed the patients home medicines and have made adjustments as needed   Critical Interventions:   supplemental oxygen and admission to hospital for hypoxic respiratory failure   Consults/Attending Physician   I discussed this case with my attending physician who cosigned this note including patient's presenting symptoms, physical exam, and planned diagnostics and interventions. Attending physician stated agreement with plan or made changes to plan which were implemented.   Attending physician assessed patient at bedside.   Discussed with Raphael Gibney who will admit   Reevaluation:  After the interventions noted above I re-evaluated patient and found that they have :improved   Social  Determinants of Health:  The patient's social determinants of health were a factor in the care of this patient    Problem List / ED Course:  Patient with severe COPD on Trelegy states compliance with this medication.  Brought in by EMS who was treated aggressively with magnesium Solu-Medrol and breathing treatments.  He seems much improved but remains hypoxic given another breathing treatment and is requiring 2 to 3 L of oxygen by nasal cannula otherwise will desaturate to 85% without exertion.  Negative CT PE study.  Will admit to the hospitalist service.   Dispostion:  After consideration of the diagnostic results  and the patients response to treatment, I feel that the patent would benefit from admission   Final Clinical Impression(s) / ED Diagnoses Final diagnoses:  COPD exacerbation [facility])  Hypoxia  Hepatic cyst    Rx / DC Orders ED Discharge Orders     None         Gailen Shelter, [place] 05/07/23 0458    Dione Booze, MD 05/07/23 7829    Dione Booze, MD 05/07/23 [phone]
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
## 2023-05-07 NOTE — TOC CM/SW Note (Signed)
Transition of Care [facility]) - Inpatient Brief Assessment   Patient Details  Name: Derek Patterson MRN: [id number] Date of Birth: 13-May-1956  Transition of Care [facility]) CM/SW Contact:    Mearl Latin, LCSW Phone Number: 05/07/2023, 2:02 PM   Clinical Narrative: Patient from home with spouse. TOC to follow for PCP needs.    Transition of Care Asessment: Insurance and Status: Insurance coverage has been reviewed Patient has primary care physician: No Home environment has been reviewed: From home w/spouse Prior level of function:: Independent Prior/Current Home Services: No current home services Social Determinants of Health Reivew: SDOH reviewed no interventions necessary Readmission risk has been reviewed: Yes Transition of care needs: transition of care needs identified, TOC will continue to follow

## 2023-05-08 ENCOUNTER — Other Ambulatory Visit (HOSPITAL_COMMUNITY): Payer: Self-pay

## 2023-05-08 DIAGNOSIS — F17200 Nicotine dependence, unspecified, uncomplicated: Secondary | ICD-10-CM

## 2023-05-08 DIAGNOSIS — N182 Chronic kidney disease, stage 2 (mild): Secondary | ICD-10-CM | POA: Diagnosis not present

## 2023-05-08 DIAGNOSIS — J441 Chronic obstructive pulmonary disease with (acute) exacerbation: Secondary | ICD-10-CM | POA: Diagnosis not present

## 2023-05-08 DIAGNOSIS — I10 Essential (primary) hypertension: Secondary | ICD-10-CM | POA: Diagnosis not present

## 2023-05-08 DIAGNOSIS — N400 Enlarged prostate without lower urinary tract symptoms: Secondary | ICD-10-CM | POA: Insufficient documentation

## 2023-05-08 LAB — CBC
HCT: 39.3 % (ref 39.0–52.0)
Hemoglobin: 13 g/dL (ref 13.0–17.0)
MCH: 27 pg (ref 26.0–34.0)
MCHC: 33.1 g/dL (ref 30.0–36.0)
MCV: 81.7 fL (ref 80.0–100.0)
Platelets: 247 10*3/uL (ref 150–400)
RBC: 4.81 MIL/uL (ref 4.22–5.81)
RDW: 14.3 % (ref 11.5–15.5)
WBC: 12 10*3/uL — ABNORMAL HIGH (ref 4.0–10.5)
nRBC: 0 % (ref 0.0–0.2)

## 2023-05-08 LAB — BASIC METABOLIC PANEL
Anion gap: 8 (ref 5–15)
BUN: 18 mg/dL (ref 8–23)
CO2: 26 mmol/L (ref 22–32)
Calcium: 9 mg/dL (ref 8.9–10.3)
Chloride: 101 mmol/L (ref 98–111)
Creatinine, Ser: 1.09 mg/dL (ref 0.61–1.24)
GFR, Estimated: >60 mL/min (ref >60)
Glucose, Bld: 157 mg/dL — ABNORMAL HIGH (ref 70–99)
Potassium: 3.4 mmol/L — ABNORMAL LOW (ref 3.5–5.1)
Sodium: 135 mmol/L (ref 135–145)

## 2023-05-08 LAB — MAGNESIUM: Magnesium: 2 mg/dL (ref 1.7–2.4)

## 2023-05-08 LAB — HIV ANTIBODY (ROUTINE TESTING W REFLEX): HIV Screen 4th Generation wRfx: NONREACTIVE

## 2023-05-08 LAB — PHOSPHORUS: Phosphorus: 2.8 mg/dL (ref 2.5–4.6)

## 2023-05-08 MED ORDER — GUAIFENESIN-DM 100-10 MG/5ML PO SYRP
5.0000 mL | ORAL_SOLUTION | Freq: Four times a day (QID) | ORAL | 0 refills | Status: DC | PRN
  Filled 2023-05-08: qty 118, 6d supply, fill #0

## 2023-05-08 MED ORDER — PREDNISONE 20 MG PO TABS
40.0000 mg | ORAL_TABLET | Freq: Every day | ORAL | 0 refills | Status: AC
  Filled 2023-05-08: qty 10, 5d supply, fill #0

## 2023-05-08 MED ORDER — GUAIFENESIN-DM 100-10 MG/5ML PO SYRP: 5.0000 mL | ORAL_SOLUTION | ORAL | Status: DC | PRN

## 2023-05-08 MED ORDER — POTASSIUM CHLORIDE CRYS ER 20 MEQ PO TBCR
40.0000 meq | EXTENDED_RELEASE_TABLET | Freq: Once | ORAL | Status: AC
  Administered 2023-05-08: 40 meq via ORAL
  Filled 2023-05-08: qty 2

## 2023-05-08 MED ORDER — AZITHROMYCIN 500 MG PO TABS
500.0000 mg | ORAL_TABLET | Freq: Every day | ORAL | 0 refills | Status: AC
  Filled 2023-05-08: qty 4, 4d supply, fill #0

## 2023-05-08 MED ORDER — PREDNISONE 20 MG PO TABS: 40.0000 mg | ORAL_TABLET | Freq: Every day | ORAL | Status: DC

## 2023-05-08 MED ORDER — AZITHROMYCIN 250 MG PO TABS: 500.0000 mg | ORAL_TABLET | Freq: Every day | ORAL | Status: DC

## 2023-05-08 NOTE — TOC CM/SW Note (Signed)
Patient's PCP is DR Sherryll Burger at [facility]

## 2023-05-08 NOTE — Assessment & Plan Note (Signed)
Smoking cessation counseling 

## 2023-05-08 NOTE — Assessment & Plan Note (Signed)
No signs of urinary retention, plan to continue with tamsulosin and finesteride.

## 2023-05-08 NOTE — Discharge Summary (Signed)
Physician Discharge Summary   Patient: Derek Patterson MRN: [id number] DOB: 06-Dec-1956  Admit date:     05/06/2023  Discharge date: 05/08/23  Discharge Physician: York Ram Tre Sanker   PCP: Pcp, No   Recommendations at discharge:    Plan to continue prednisone 40 mg for 4 more days. Continue bronchodilator therapy and inhaled corticosteroids.  Antibiotic therapy with Azithromycin for 4 more days.  Airway clearing techniques with flutter valve and incentive spirometer.  Antitussive agents.   Discharge Diagnoses: Principal Problem:   COPD with acute exacerbation (HCC) Active Problems:   Chronic kidney disease (CKD), stage II (mild)   Essential hypertension   Tobacco use disorder   BPH (benign prostatic hyperplasia)  Resolved Problems:   * No resolved hospital problems. [facility] Course: Derek Patterson was admitted to the hospital with the working diagnosis of COPD exacerbation.   67 yo male with the past medical history of COPD, hypertension, BPH who presented with dyspnea. Reported few days of worsening dyspnea, cough and increased sputum production. 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 In the ED his 02 saturation was 84% on room air, blood pressure 113/67, HR 76, RR 22 and temp 97.7 lungs with rales at bases with no wheezing, heart with S1 and S2 present and rhythmic, abdomen with no distention and no lower extremity edema.   Na 139, K 3,5 Cl 102 bicarbonate 28 glucose 110 bun 16 cr 1.43.  Wbc 6,4 hgb 14,6 plt 252   Chest radiograph with hyperinflation with no infiltrates or effusions.   CT chest with bilateral apical centrilobular emphysema. No evidence of pulmonary embolism.   EKG 80 bpm, normal axis, right bundle branch block, sinus rhythm with left atrial enlargement, no significant ST segment or T wave changes.   Patient was placed on systemic corticosteroids and bronchodilator therapy.    Assessment and Plan: * COPD with acute exacerbation [facility]) Patient with improvement in his symptoms. He has responded well to bronchodilator therapy, inhaled and systemic corticosteroids.   At the time of his discharge his 02 saturation is 100% on room air.   Plan to continue with fluticasone, Umeclidin and Vilanterol inhalation.  As needed albuterol.  Will add prednisone 40 mg daily for 4 days and oral antibiotic therapy with azithromycin for 4 days.  Antitussive agents and airway clearing techniques with flutter valve and incentive spirometer.  Follow up as outpatient.   Chronic kidney disease (CKD), stage II (mild) AKI, Hypokalemia   Renal function with serum cr at 1,0 with K at 3,4 and serum bicarbonate at 26.  Mg 2,0 and P 2,8   Patient will receive 40 meq Kcl prior to his discharge.  Follow up renal function and electrolytes as outpatient.   Essential hypertension Continue blood pressure control with HCTZ Follow up as outpatient.   Tobacco use disorder Smoking cessation counseling.   BPH (benign prostatic hyperplasia) No signs of urinary retention, plan to continue with tamsulosin and finesteride.          Consultants: none  Procedures performed: none   Disposition: Home Diet recommendation:  Cardiac diet DISCHARGE MEDICATION: Allergies as of 05/08/2023   No Known Allergies      Medication List     TAKE these medications    albuterol 108 (90 Base) MCG/ACT inhaler Commonly known as: VENTOLIN HFA INHALE 1-2 PUFFS into THE lungs EVERY 4 HOURS FOR FOR WHEEZING OR SHORTNESS OF BREATH What changed: See the  new instructions.   albuterol (2.5 MG/3ML) 0.083% nebulizer solution Commonly known as: PROVENTIL Take 2.5 mg by nebulization every 4 (four) hours as needed for wheezing or shortness of breath. What changed: Another medication with the same name was changed. Make sure you understand how and when to take each.   aspirin EC 81 MG tablet Take 81 mg by  mouth daily.   azithromycin 500 MG tablet Commonly known as: ZITHROMAX Take 1 tablet (500 mg total) by mouth daily for 4 days.   CENTRUM SILVER 50+MEN PO Take 1 tablet by mouth daily.   finasteride 5 MG tablet Commonly known as: PROSCAR Take 5 mg by mouth daily.   guaiFENesin-dextromethorphan 100-10 MG/5ML syrup Commonly known as: ROBITUSSIN DM Take 5 mLs by mouth every 6 (six) hours as needed for cough.   hydrochlorothiazide 25 MG tablet Commonly known as: HYDRODIURIL TAKE 1 TABLET BY MOUTH EVERY DAY   predniSONE 20 MG tablet Commonly known as: DELTASONE Take 2 tablets (40 mg total) by mouth daily with breakfast for 5 days. Start taking on: May 09, 2023   tamsulosin 0.4 MG Caps capsule Commonly known as: FLOMAX Take 1 capsule (0.4 mg total) by mouth daily after supper. TAKE 1 CAPSULE BY MOUTH EVERY DAY AFTER SUPPER What changed: additional instructions   Trelegy Ellipta 100-62.5-25 MCG/ACT Aepb Generic drug: Fluticasone-Umeclidin-Vilant Inhale 1 puff into the lungs daily.        Discharge Exam: Filed Weights   05/06/23 2353  Weight: 63.5 kg   BP 121/80 (BP Location: Left Arm)   Pulse 77   Temp 97.7 F (36.5 C) (Axillary)   Resp 17   Ht 5\' 10"  (1.778 m)   Wt 63.5 kg   SpO2 100%   BMI 20.09 kg/m   Patient is feeling better, dyspnea and cough are improving.   Neurology awake and alert ENT with no pallor  Cardiovascular with S1 and S2 present and rhythmic with no gallops, rubs or murmurs Respiratory with no wheezing or rales, no rhonchi, and adequate ventilation. Abdomen with no distention  No  lower extremity edema   Condition at discharge: stable  The results of significant diagnostics from this hospitalization (including imaging, microbiology, ancillary and laboratory) are listed below for reference.   Imaging Studies: CT Angio Chest PE W/Cm &/Or Wo Cm  Result Date: 05/07/2023 CLINICAL DATA:  Shortness of breath and chest pain. EXAM: CT  ANGIOGRAPHY CHEST WITH CONTRAST TECHNIQUE: Multidetector CT imaging of the chest was performed using the standard protocol during bolus administration of intravenous contrast. Multiplanar CT image reconstructions and MIPs were obtained to evaluate the vascular anatomy. RADIATION DOSE REDUCTION: This exam was performed according to the departmental dose-optimization program which includes automated exposure control, adjustment of the mA and/or kV according to patient size and/or use of iterative reconstruction technique. CONTRAST:  50mL OMNIPAQUE IOHEXOL 350 MG/ML SOLN COMPARISON:  July 19, 2022 FINDINGS: Cardiovascular: There is mild calcification of the aortic arch, without evidence of aortic aneurysm. Satisfactory opacification of the pulmonary arteries to the segmental level. No evidence of pulmonary embolism. Normal heart size. No pericardial effusion. Mediastinum/Nodes: No enlarged mediastinal, hilar, or axillary lymph nodes. Thyroid gland, trachea, and esophagus demonstrate no significant findings. Lungs/Pleura: There is marked severity centrilobular emphysematous lung disease. Mild, stable scarring and/or atelectasis is seen within the right lower lobe. Mild posterior left basilar atelectasis is also noted. The 5 mm lung nodule seen within the posterior aspect of the left upper lobe on the prior study is no  longer visualized. There is no evidence of a pleural effusion or pneumothorax. Upper Abdomen: Numerous subcentimeter cystic appearing structures are seen scattered throughout the liver. Musculoskeletal: No chest wall abnormality. No acute or significant osseous findings. Review of the MIP images confirms the above findings. IMPRESSION: 1. No evidence of pulmonary embolism. 2. Marked severity centrilobular emphysematous lung disease. 3. Mild, stable right lower lobe scarring and/or atelectasis. 4. Mild posterior left basilar atelectasis. 5. Findings likely consistent with numerous hepatic cysts.  Correlation with nonemergent hepatic ultrasound is recommended. 6. Aortic atherosclerosis. Aortic Atherosclerosis (ICD10-I70.0) and Emphysema (ICD10-J43.9). Electronically Signed   By: Aram Candela M.D.   On: 05/07/2023 03:54   DG Chest 2 View  Result Date: 05/07/2023 CLINICAL DATA:  Productive cough, shortness of breath. EXAM: CHEST - 2 VIEW COMPARISON:  12/17/2022. FINDINGS: The heart size and mediastinal contours are within normal limits. Emphysematous changes are present in the lungs. No consolidation, effusion, or pneumothorax. No acute osseous abnormality. IMPRESSION: 1. No active cardiopulmonary disease. 2. Emphysema. Electronically Signed   By: Thornell Sartorius M.D.   On: 05/07/2023 01:26    Microbiology: Results for orders placed or performed during the hospital encounter of 12/17/22  Resp panel by RT-PCR (RSV, Flu A&B, Covid) Anterior Nasal Swab     Status: None   Collection Time: 12/18/22  2:54 AM   Specimen: Anterior Nasal Swab  Result Value Ref Range Status   SARS Coronavirus 2 by RT PCR NEGATIVE NEGATIVE Final    Comment: (NOTE) SARS-CoV-2 target nucleic acids are NOT DETECTED.  The SARS-CoV-2 RNA is generally detectable in upper respiratory specimens during the acute phase of infection. The lowest concentration of SARS-CoV-2 viral copies this assay can detect is 138 copies/mL. A negative result does not preclude SARS-Cov-2 infection and should not be used as the sole basis for treatment or other patient management decisions. A negative result may occur with  improper specimen collection/handling, submission of specimen other than nasopharyngeal swab, presence of viral mutation(s) within the areas targeted by this assay, and inadequate number of viral copies(<138 copies/mL). A negative result must be combined with clinical observations, patient history, and epidemiological information. The expected result is Negative.  Fact Sheet for Patients:   BloggerCourse.com  Fact Sheet for Healthcare Providers:  SeriousBroker.it  This test is no t yet approved or cleared by the [place] FDA and  has been authorized for detection and/or diagnosis of SARS-CoV-2 by FDA under an Emergency Use Authorization (EUA). This EUA will remain  in effect (meaning this test can be used) for the duration of the COVID-19 declaration under Section 564(b)(1) of the Act, 21 U.S.C.section 360bbb-3(b)(1), unless the authorization is terminated  or revoked sooner.       Influenza A by PCR NEGATIVE NEGATIVE Final   Influenza B by PCR NEGATIVE NEGATIVE Final    Comment: (NOTE) The Xpert Xpress SARS-CoV-2/FLU/RSV plus assay is intended as an aid in the diagnosis of influenza from Nasopharyngeal swab specimens and should not be used as a sole basis for treatment. Nasal washings and aspirates are unacceptable for Xpert Xpress SARS-CoV-2/FLU/RSV testing.  Fact Sheet for Patients: BloggerCourse.com  Fact Sheet for Healthcare Providers: SeriousBroker.it  This test is not yet approved or cleared by the [place] FDA and has been authorized for detection and/or diagnosis of SARS-CoV-2 by FDA under an Emergency Use Authorization (EUA). This EUA will remain in effect (meaning this test can be used) for the duration of the COVID-19 declaration under Section 564(b)(1) of the  Act, 21 U.S.C. section 360bbb-3(b)(1), unless the authorization is terminated or revoked.     Resp Syncytial Virus by PCR NEGATIVE NEGATIVE Final    Comment: (NOTE) Fact Sheet for Patients: BloggerCourse.com  Fact Sheet for Healthcare Providers: SeriousBroker.it  This test is not yet approved or cleared by the [place] FDA and has been authorized for detection and/or diagnosis of SARS-CoV-2 by FDA under an Emergency Use  Authorization (EUA). This EUA will remain in effect (meaning this test can be used) for the duration of the COVID-19 declaration under Section 564(b)(1) of the Act, 21 U.S.C. section 360bbb-3(b)(1), unless the authorization is terminated or revoked.  Performed at [facility], [address], [place], [place] [address]     Labs: CBC: Recent Labs  Lab 05/07/23 0135 05/07/23 2352  WBC 6.4 12.0*  NEUTROABS 5.5  --   HGB 14.6 13.0  HCT 45.9 39.3  MCV 85.8 81.7  PLT 252 247   Basic Metabolic Panel: Recent Labs  Lab 05/07/23 0135 05/07/23 2352  NA 139 135  K 3.5 3.4*  CL 102 101  CO2 28 26  GLUCOSE 110* 157*  BUN 16 18  CREATININE 1.43* 1.09  CALCIUM 8.9 9.0  MG  --  2.0  PHOS  --  2.8   Liver Function Tests: No results for input(s): "AST", "ALT", "ALKPHOS", "BILITOT", "PROT", "ALBUMIN" in the last 168 hours. CBG: No results for input(s): "GLUCAP" in the last 168 hours.  Discharge time spent: greater than 30 minutes.  Signed: Coralie Keens, MD [facility] 05/08/2023

## 2023-05-08 NOTE — Hospital Course (Addendum)
Mr. Lello was admitted to the hospital with the working diagnosis of COPD exacerbation.   67 yo male with the past medical history of COPD, hypertension, BPH who presented with dyspnea. Reported few days of worsening dyspnea, cough and increased sputum production. 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 In the ED his 02 saturation was 84% on room air, blood pressure 113/67, HR 76, RR 22 and temp 97.7 lungs with rales at bases with no wheezing, heart with S1 and S2 present and rhythmic, abdomen with no distention and no lower extremity edema.   Na 139, K 3,5 Cl 102 bicarbonate 28 glucose 110 bun 16 cr 1.43.  Wbc 6,4 hgb 14,6 plt 252   Chest radiograph with hyperinflation with no infiltrates or effusions.   CT chest with bilateral apical centrilobular emphysema. No evidence of pulmonary embolism.   EKG 80 bpm, normal axis, right bundle branch block, sinus rhythm with left atrial enlargement, no significant ST segment or T wave changes.   Patient was placed on systemic corticosteroids and bronchodilator therapy.

## 2023-05-08 NOTE — Assessment & Plan Note (Signed)
AKI, Hypokalemia   Renal function with serum cr at 1,0 with K at 3,4 and serum bicarbonate at 26.  Mg 2,0 and P 2,8   Patient will receive 40 meq Kcl prior to his discharge.  Follow up renal function and electrolytes as outpatient.

## 2023-05-08 NOTE — Assessment & Plan Note (Addendum)
Acute hypoxemic respiratory failure.  Patient with improvement in his symptoms. He has responded well to bronchodilator therapy, inhaled and systemic corticosteroids.   At the time of his discharge his 02 saturation is 100% on room air.   Plan to continue with fluticasone, Umeclidin and Vilanterol inhalation.  As needed albuterol.  Will add prednisone 40 mg daily for 4 days and oral antibiotic therapy with azithromycin for 4 days.  Antitussive agents and airway clearing techniques with flutter valve and incentive spirometer.  Follow up as outpatient.

## 2023-05-08 NOTE — Assessment & Plan Note (Signed)
Continue blood pressure control with HCTZ Follow up as outpatient.

## 2023-08-16 ENCOUNTER — Other Ambulatory Visit: Payer: Self-pay | Admitting: Student

## 2023-12-27 ENCOUNTER — Other Ambulatory Visit: Payer: Self-pay | Admitting: Urology

## 2023-12-27 DIAGNOSIS — R972 Elevated prostate specific antigen [PSA]: Secondary | ICD-10-CM

## 2024-01-30 ENCOUNTER — Other Ambulatory Visit (HOSPITAL_COMMUNITY): Payer: Self-pay | Admitting: Otolaryngology

## 2024-01-30 DIAGNOSIS — Z720 Tobacco use: Secondary | ICD-10-CM

## 2024-01-30 DIAGNOSIS — R634 Abnormal weight loss: Secondary | ICD-10-CM

## 2024-01-30 DIAGNOSIS — R1314 Dysphagia, pharyngoesophageal phase: Secondary | ICD-10-CM

## 2024-02-06 ENCOUNTER — Ambulatory Visit
Admission: RE | Admit: 2024-02-06 | Discharge: 2024-02-06 | Disposition: A | Discharge status: Home / Self Care | Payer: Medicare HMO | Source: Ambulatory Visit | Attending: Urology

## 2024-02-06 DIAGNOSIS — R972 Elevated prostate specific antigen [PSA]: Secondary | ICD-10-CM

## 2024-02-06 MED ORDER — GADOPICLENOL 0.5 MMOL/ML IV SOLN
6.0000 mL | Freq: Once | INTRAVENOUS | Status: AC | PRN
  Administered 2024-02-06: 6 mL via INTRAVENOUS

## 2024-02-07 ENCOUNTER — Other Ambulatory Visit (HOSPITAL_COMMUNITY): Payer: Medicare HMO

## 2024-02-14 ENCOUNTER — Ambulatory Visit (HOSPITAL_COMMUNITY)
Admission: RE | Admit: 2024-02-14 | Discharge: 2024-02-14 | Disposition: A | Discharge status: Home / Self Care | Payer: Medicare HMO | Source: Ambulatory Visit | Attending: Otolaryngology | Admitting: Otolaryngology

## 2024-02-14 DIAGNOSIS — R634 Abnormal weight loss: Secondary | ICD-10-CM | POA: Diagnosis present

## 2024-02-14 DIAGNOSIS — R1314 Dysphagia, pharyngoesophageal phase: Secondary | ICD-10-CM | POA: Diagnosis present

## 2024-02-14 DIAGNOSIS — Z720 Tobacco use: Secondary | ICD-10-CM | POA: Diagnosis present

## 2024-02-25 ENCOUNTER — Telehealth (HOSPITAL_COMMUNITY): Payer: Self-pay | Admitting: *Deleted

## 2024-02-25 NOTE — Telephone Encounter (Signed)
 Attempted to contact patient to schedule OP MBS. Left VM. RKEEL

## 2024-03-10 ENCOUNTER — Telehealth (HOSPITAL_COMMUNITY): Payer: Self-pay | Admitting: *Deleted

## 2024-03-10 NOTE — Telephone Encounter (Signed)
 2nd attempt to contact patient about scheduling OP MBS. Left VM @ [phone]. RKEEL

## 2024-03-25 ENCOUNTER — Other Ambulatory Visit (HOSPITAL_COMMUNITY): Payer: Self-pay | Admitting: *Deleted

## 2024-03-25 DIAGNOSIS — R1310 Dysphagia, unspecified: Secondary | ICD-10-CM

## 2024-03-29 ENCOUNTER — Inpatient Hospital Stay (HOSPITAL_COMMUNITY)
Admission: EM | Admit: 2024-03-29 | Discharge: 2024-03-31 | DRG: 189 | Disposition: A | Discharge status: Home / Self Care | Attending: Family Medicine | Admitting: Family Medicine

## 2024-03-29 ENCOUNTER — Emergency Department (HOSPITAL_COMMUNITY)

## 2024-03-29 ENCOUNTER — Encounter (HOSPITAL_COMMUNITY): Payer: Self-pay | Admitting: Emergency Medicine

## 2024-03-29 ENCOUNTER — Other Ambulatory Visit: Payer: Self-pay

## 2024-03-29 DIAGNOSIS — N400 Enlarged prostate without lower urinary tract symptoms: Secondary | ICD-10-CM | POA: Diagnosis present

## 2024-03-29 DIAGNOSIS — Z833 Family history of diabetes mellitus: Secondary | ICD-10-CM | POA: Diagnosis not present

## 2024-03-29 DIAGNOSIS — F17210 Nicotine dependence, cigarettes, uncomplicated: Secondary | ICD-10-CM | POA: Diagnosis present

## 2024-03-29 DIAGNOSIS — I10 Essential (primary) hypertension: Secondary | ICD-10-CM | POA: Diagnosis present

## 2024-03-29 DIAGNOSIS — Z8249 Family history of ischemic heart disease and other diseases of the circulatory system: Secondary | ICD-10-CM | POA: Diagnosis not present

## 2024-03-29 DIAGNOSIS — J441 Chronic obstructive pulmonary disease with (acute) exacerbation: Secondary | ICD-10-CM | POA: Diagnosis present

## 2024-03-29 DIAGNOSIS — Z1152 Encounter for screening for COVID-19: Secondary | ICD-10-CM

## 2024-03-29 DIAGNOSIS — Z7982 Long term (current) use of aspirin: Secondary | ICD-10-CM

## 2024-03-29 DIAGNOSIS — Z823 Family history of stroke: Secondary | ICD-10-CM | POA: Diagnosis not present

## 2024-03-29 DIAGNOSIS — J9601 Acute respiratory failure with hypoxia: Secondary | ICD-10-CM | POA: Diagnosis present

## 2024-03-29 LAB — CBC WITH DIFFERENTIAL/PLATELET
Abs Immature Granulocytes: 0.01 10*3/uL (ref 0.00–0.07)
Basophils Absolute: 0 10*3/uL (ref 0.0–0.1)
Basophils Relative: 1 %
Eosinophils Absolute: 0.1 10*3/uL (ref 0.0–0.5)
Eosinophils Relative: 1 %
HCT: 49 % (ref 39.0–52.0)
Hemoglobin: 15.7 g/dL (ref 13.0–17.0)
Immature Granulocytes: 0 %
Lymphocytes Relative: 45 %
Lymphs Abs: 3.8 10*3/uL (ref 0.7–4.0)
MCH: 27.4 pg (ref 26.0–34.0)
MCHC: 32 g/dL (ref 30.0–36.0)
MCV: 85.5 fL (ref 80.0–100.0)
Monocytes Absolute: 0.6 10*3/uL (ref 0.1–1.0)
Monocytes Relative: 7 %
Neutro Abs: 3.9 10*3/uL (ref 1.7–7.7)
Neutrophils Relative %: 46 %
Platelets: 305 10*3/uL (ref 150–400)
RBC: 5.73 MIL/uL (ref 4.22–5.81)
RDW: 13.7 % (ref 11.5–15.5)
WBC: 8.5 10*3/uL (ref 4.0–10.5)
nRBC: 0 % (ref 0.0–0.2)

## 2024-03-29 LAB — RESP PANEL BY RT-PCR (RSV, FLU A&B, COVID)  RVPGX2
Influenza A by PCR: NEGATIVE
Influenza B by PCR: NEGATIVE
Resp Syncytial Virus by PCR: NEGATIVE
SARS Coronavirus 2 by RT PCR: NEGATIVE

## 2024-03-29 LAB — COMPREHENSIVE METABOLIC PANEL WITH GFR
ALT: 28 U/L (ref 0–44)
AST: 47 U/L — ABNORMAL HIGH (ref 15–41)
Albumin: 4.3 g/dL (ref 3.5–5.0)
Alkaline Phosphatase: 92 U/L (ref 38–126)
Anion gap: 10 (ref 5–15)
BUN: 14 mg/dL (ref 8–23)
CO2: 24 mmol/L (ref 22–32)
Calcium: 9.1 mg/dL (ref 8.9–10.3)
Chloride: 103 mmol/L (ref 98–111)
Creatinine, Ser: 0.96 mg/dL (ref 0.61–1.24)
GFR, Estimated: >60 mL/min (ref >60)
Glucose, Bld: 116 mg/dL — ABNORMAL HIGH (ref 70–99)
Potassium: 4.5 mmol/L (ref 3.5–5.1)
Sodium: 137 mmol/L (ref 135–145)
Total Bilirubin: 1.4 mg/dL — ABNORMAL HIGH (ref 0.0–1.2)
Total Protein: 7.2 g/dL (ref 6.5–8.1)

## 2024-03-29 LAB — BRAIN NATRIURETIC PEPTIDE: B Natriuretic Peptide: 9.1 pg/mL (ref 0.0–100.0)

## 2024-03-29 LAB — TROPONIN I (HIGH SENSITIVITY): Troponin I (High Sensitivity): 6 ng/L (ref <18)

## 2024-03-29 LAB — I-STAT CG4 LACTIC ACID, ED: Lactic Acid, Venous: 0.8 mmol/L (ref 0.5–1.9)

## 2024-03-29 MED ORDER — METHYLPREDNISOLONE SODIUM SUCC 125 MG IJ SOLR
125.0000 mg | Freq: Once | INTRAMUSCULAR | Status: AC
  Administered 2024-03-29: 125 mg via INTRAVENOUS
  Filled 2024-03-29: qty 2

## 2024-03-29 MED ORDER — IPRATROPIUM-ALBUTEROL 0.5-2.5 (3) MG/3ML IN SOLN
3.0000 mL | Freq: Once | RESPIRATORY_TRACT | Status: AC
  Administered 2024-03-29: 3 mL via RESPIRATORY_TRACT
  Filled 2024-03-29: qty 3

## 2024-03-29 MED ORDER — SODIUM CHLORIDE 0.9 % IV SOLN
2.0000 g | Freq: Once | INTRAVENOUS | Status: AC
  Administered 2024-03-29: 2 g via INTRAVENOUS
  Filled 2024-03-29: qty 20

## 2024-03-29 MED ORDER — ALBUTEROL SULFATE (2.5 MG/3ML) 0.083% IN NEBU
2.5000 mg | INHALATION_SOLUTION | Freq: Once | RESPIRATORY_TRACT | Status: AC
  Administered 2024-03-29: 2.5 mg via RESPIRATORY_TRACT
  Filled 2024-03-29: qty 3

## 2024-03-29 MED ORDER — SODIUM CHLORIDE 0.9 % IV SOLN
500.0000 mg | Freq: Once | INTRAVENOUS | Status: AC
  Administered 2024-03-29: 500 mg via INTRAVENOUS
  Filled 2024-03-29: qty 5

## 2024-03-29 NOTE — ED Triage Notes (Signed)
 Pt via [facility] c/o SOB with h\x HTN and COPD. Pt O2 sat 80s on room air with obvious resp distress. Pt placed on CPAP, received 1 epi, solumedrol, 2 mag, 2 albuterol , duoneb. 20ga IV in left upper arm. Pt is unable to answer questions due to SOB. RR 48 at time of arrival with CPAP in place. Pt denies pain.

## 2024-03-29 NOTE — ED Provider Notes (Signed)
  Provider Note MRN:  [id number]  Arrival date & time: 03/30/24    ED Course and Medical Decision Making  Assumed care of patient at sign-out or upon transfer.  COPD exacerbation with hypoxic respiratory failure.  Doing well with BiPAP.  Plan is for admission.  X-ray nonacute excepted for admission by hospitalist, stepdown unit.  .Critical Care  Performed by: Edson Graces, MD Authorized by: Edson Graces, MD   Critical care provider statement:    Critical care time (minutes):  32   Critical care was necessary to treat or prevent imminent or life-threatening deterioration of the following conditions:  Respiratory failure   Critical care was time spent personally by me on the following activities:  Development of treatment plan with patient or surrogate, discussions with consultants, evaluation of patient's response to treatment, examination of patient, ordering and review of laboratory studies, ordering and review of radiographic studies, ordering and performing treatments and interventions, pulse oximetry, re-evaluation of patient's condition and review of old charts   Final Clinical Impressions(s) / ED Diagnoses     ICD-10-CM   1. Acute respiratory failure with hypoxia (HCC)  J96.01     2. COPD exacerbation [facility])  J44.1       ED Discharge Orders     None       Discharge Instructions   None     Merrick Abe. Harless Lien, MD [facility] [email or web address]    Edson Graces, MD 03/30/24 [phone]

## 2024-03-29 NOTE — ED Provider Notes (Signed)
 [place] EMERGENCY DEPARTMENT AT [facility] Provider Note   CSN: [id number] Arrival date & time: 03/29/24  2145     History  Chief Complaint  Patient presents with   Shortness of Breath    Derek Patterson is a 68 y.o. male.  HPI 68 year old male with a history of COPD presents with respiratory distress.  History is from the nurse who spoke to EMS.  He is already on BiPAP by the time I am seeing him and reportedly had sudden onset shortness of breath tonight and was put on CPAP but was still having increased work of breathing with respiratory rate in the 40s.  He is much better on the BiPAP.  Patient tells me he has a chronic cough with some white sputum but everything got worse tonight.  His wife arrived later and tells me that she was woken up by him in respiratory distress.  He has had this before with COPD.  He denies any leg swelling or chest pain or fever.  Home Medications Prior to Admission medications   Medication Sig Start Date End Date Taking? Authorizing Provider  albuterol  (PROVENTIL ) (2.5 MG/3ML) 0.083% nebulizer solution Take 2.5 mg by nebulization every 4 (four) hours as needed for wheezing or shortness of breath. 12/26/22   [provider]  albuterol  (VENTOLIN  HFA) 108 (90 Base) MCG/ACT inhaler INHALE 1-2 PUFFS into THE lungs EVERY 4 HOURS FOR FOR WHEEZING OR SHORTNESS OF BREATH Patient taking differently: Inhale 1-2 puffs into the lungs every 4 (four) hours as needed for wheezing or shortness of breath. 12/19/22   Cathey Clunes, MD  aspirin  81 MG EC tablet Take 81 mg by mouth daily.    [provider]  finasteride  (PROSCAR ) 5 MG tablet Take 5 mg by mouth daily. 04/21/23   [provider]  Fluticasone -Umeclidin-Vilant (TRELEGY ELLIPTA ) 100-62.5-25 MCG/ACT AEPB Inhale 1 puff into the lungs daily. 08/03/22 08/03/23  Cathey Clunes, MD  guaiFENesin -dextromethorphan  (ROBITUSSIN DM) 100-10 MG/5ML syrup Take 5 mLs by mouth every 6  (six) hours as needed for cough. 05/08/23   Arrien, Curlee Doss, MD  hydrochlorothiazide  (HYDRODIURIL ) 25 MG tablet TAKE 1 TABLET BY MOUTH EVERY DAY Patient taking differently: Take 25 mg by mouth daily. 02/13/23   Cathey Clunes, MD  Multiple Vitamins-Minerals (CENTRUM SILVER 50+MEN PO) Take 1 tablet by mouth daily.    [provider]      Allergies    Patient has no known allergies.    Review of Systems   Review of Systems  Constitutional:  Negative for fever.  Respiratory:  Positive for cough and shortness of breath.   Cardiovascular:  Negative for chest pain and leg swelling.    Physical Exam Updated Vital Signs BP (!) 142/89   Pulse 90   Temp (!) 96.9 F (36.1 C) (Oral)   Resp (!) 27   Ht 5\' 10"  (1.778 m)   Wt 63.5 kg   SpO2 100%   BMI 20.09 kg/m  Physical Exam Vitals and nursing note reviewed.  Constitutional:      Appearance: He is well-developed. He is not diaphoretic.     Comments: On BiPAP.  HENT:     Head: Normocephalic and atraumatic.  Cardiovascular:     Rate and Rhythm: Normal rate and regular rhythm.     Heart sounds: Normal heart sounds.  Pulmonary:     Effort: Pulmonary effort is normal. Tachypnea present. No accessory muscle usage.     Breath sounds: Wheezing present.  Abdominal:  Palpations: Abdomen is soft.     Tenderness: There is no abdominal tenderness.  Musculoskeletal:     Right lower leg: No edema.     Left lower leg: No edema.  Skin:    General: Skin is warm and dry.  Neurological:     Mental Status: He is alert.     ED Results / Procedures / Treatments   Labs (all labs ordered are listed, but only abnormal results are displayed) Labs Reviewed  COMPREHENSIVE METABOLIC PANEL WITH GFR - Abnormal; Notable for the following components:      Result Value   Glucose, Bld 116 (*)    AST 47 (*)    Total Bilirubin 1.4 (*)    All other components within normal limits  RESP PANEL BY RT-PCR (RSV, FLU A&B, COVID)   RVPGX2  RESP PANEL BY RT-PCR (RSV, FLU A&B, COVID)  RVPGX2  CULTURE, BLOOD (ROUTINE X 2)  CULTURE, BLOOD (ROUTINE X 2)  CBC WITH DIFFERENTIAL/PLATELET  BRAIN NATRIURETIC PEPTIDE  I-STAT CG4 LACTIC ACID, ED  TROPONIN I (HIGH SENSITIVITY)    EKG EKG Interpretation Date/Time:  Saturday March 29 2024 21:53:28 EDT Ventricular Rate:  64 PR Interval:  165 QRS Duration:  146 QT Interval:  410 QTC Calculation: 423 R Axis:   107  Text Interpretation: Sinus rhythm Atrial premature complexes RBBB and LPFB Baseline wander in lead(s) V1 V2 V3 V4 V5 no significant change since May 2024 Confirmed by Derek Patterson [phone]) on 03/29/2024 10:05:38 PM  Radiology No results found.  Procedures .Critical Care  Performed by: Derek Montenegro, MD Authorized by: Derek Montenegro, MD   Critical care provider statement:    Critical care time (minutes):  30   Critical care time was exclusive of:  Separately billable procedures and treating other patients   Critical care was necessary to treat or prevent imminent or life-threatening deterioration of the following conditions:  Respiratory failure   Critical care was time spent personally by me on the following activities:  Development of treatment plan with patient or surrogate, discussions with consultants, evaluation of patient's response to treatment, examination of patient, ordering and review of laboratory studies, ordering and review of radiographic studies, ordering and performing treatments and interventions, pulse oximetry, re-evaluation of patient's condition and review of old charts     Medications Ordered in ED Medications  azithromycin  (ZITHROMAX ) 500 mg in sodium chloride  0.9 % 250 mL IVPB (has no administration in time range)  cefTRIAXone  (ROCEPHIN ) 2 g in sodium chloride  0.9 % 100 mL IVPB (0 g Intravenous Stopped 03/29/24 2253)  methylPREDNISolone  sodium succinate (SOLU-MEDROL ) 125 mg/2 mL injection 125 mg (125 mg Intravenous Given 03/29/24  2213)  albuterol  (PROVENTIL ) (2.5 MG/3ML) 0.083% nebulizer solution 2.5 mg (2.5 mg Nebulization Given 03/29/24 2208)  ipratropium-albuterol  (DUONEB) 0.5-2.5 (3) MG/3ML nebulizer solution 3 mL (3 mLs Nebulization Given 03/29/24 2208)    ED Course/ Medical Decision Making/ A&P                                 Medical Decision Making Amount and/or Complexity of Data Reviewed Labs: ordered.    Details: Normal WBC Radiology: ordered. ECG/medicine tests: ordered and independent interpretation performed.    Details: Sinus rhythm  Risk Prescription drug management.   Patient presents with respiratory distress.  He is a lot better on the BiPAP.  He was given albuterol  and Atrovent .  He has already received epinephrine and Solu-Medrol  and  magnesium  from EMS.  He is comfortable on the BiPAP and has diffuse expiratory wheezing.  No evidence of CHF on workup or history.  There was a delay and chest x-ray being obtained which is now pending. Care transferred to Dr. Harless Lien.        Final Clinical Impression(s) / ED Diagnoses Final diagnoses:  Acute respiratory failure with hypoxia (HCC)  COPD exacerbation (HCC)    Rx / DC Orders ED Discharge Orders     None         Derek Montenegro, MD 03/29/24 2324

## 2024-03-30 ENCOUNTER — Encounter (HOSPITAL_COMMUNITY): Payer: Self-pay | Admitting: Family Medicine

## 2024-03-30 DIAGNOSIS — J441 Chronic obstructive pulmonary disease with (acute) exacerbation: Secondary | ICD-10-CM | POA: Diagnosis not present

## 2024-03-30 LAB — CBC
HCT: 42.8 % (ref 39.0–52.0)
Hemoglobin: 14 g/dL (ref 13.0–17.0)
MCH: 27.2 pg (ref 26.0–34.0)
MCHC: 32.7 g/dL (ref 30.0–36.0)
MCV: 83.3 fL (ref 80.0–100.0)
Platelets: 278 10*3/uL (ref 150–400)
RBC: 5.14 MIL/uL (ref 4.22–5.81)
RDW: 13.7 % (ref 11.5–15.5)
WBC: 3.5 10*3/uL — ABNORMAL LOW (ref 4.0–10.5)
nRBC: 0 % (ref 0.0–0.2)

## 2024-03-30 LAB — BASIC METABOLIC PANEL WITH GFR
Anion gap: 9 (ref 5–15)
BUN: 16 mg/dL (ref 8–23)
CO2: 26 mmol/L (ref 22–32)
Calcium: 9.7 mg/dL (ref 8.9–10.3)
Chloride: 102 mmol/L (ref 98–111)
Creatinine, Ser: 1.14 mg/dL (ref 0.61–1.24)
GFR, Estimated: >60 mL/min (ref >60)
Glucose, Bld: 162 mg/dL — ABNORMAL HIGH (ref 70–99)
Potassium: 4 mmol/L (ref 3.5–5.1)
Sodium: 137 mmol/L (ref 135–145)

## 2024-03-30 LAB — EXPECTORATED SPUTUM ASSESSMENT W GRAM STAIN, RFLX TO RESP C

## 2024-03-30 LAB — TROPONIN I (HIGH SENSITIVITY): Troponin I (High Sensitivity): 8 ng/L (ref <18)

## 2024-03-30 MED ORDER — ONDANSETRON HCL 4 MG PO TABS: 4.0000 mg | ORAL_TABLET | Freq: Four times a day (QID) | ORAL | Status: DC | PRN

## 2024-03-30 MED ORDER — SENNOSIDES-DOCUSATE SODIUM 8.6-50 MG PO TABS
1.0000 | ORAL_TABLET | Freq: Every evening | ORAL | Status: DC | PRN
Start: 2024-03-30 — End: 2024-03-31

## 2024-03-30 MED ORDER — ONDANSETRON HCL 4 MG/2ML IJ SOLN: 4.0000 mg | Freq: Four times a day (QID) | INTRAMUSCULAR | Status: DC | PRN

## 2024-03-30 MED ORDER — PREDNISONE 20 MG PO TABS
40.0000 mg | ORAL_TABLET | Freq: Every day | ORAL | Status: DC
  Administered 2024-03-31: 40 mg via ORAL
  Filled 2024-03-30: qty 2

## 2024-03-30 MED ORDER — SODIUM CHLORIDE 0.9% FLUSH
3.0000 mL | Freq: Two times a day (BID) | INTRAVENOUS | Status: DC
Start: 2024-03-30 — End: 2024-03-31
  Administered 2024-03-30 – 2024-03-31 (×4): 3 mL via INTRAVENOUS

## 2024-03-30 MED ORDER — LORATADINE 10 MG PO TABS
10.0000 mg | ORAL_TABLET | Freq: Every day | ORAL | Status: DC
  Administered 2024-03-30 – 2024-03-31 (×2): 10 mg via ORAL
  Filled 2024-03-30 (×2): qty 1

## 2024-03-30 MED ORDER — METHYLPREDNISOLONE SODIUM SUCC 125 MG IJ SOLR
125.0000 mg | Freq: Two times a day (BID) | INTRAMUSCULAR | Status: AC
  Administered 2024-03-30 (×2): 125 mg via INTRAVENOUS
  Filled 2024-03-30 (×2): qty 2

## 2024-03-30 MED ORDER — ENOXAPARIN SODIUM 40 MG/0.4ML IJ SOSY
40.0000 mg | PREFILLED_SYRINGE | INTRAMUSCULAR | Status: DC
  Administered 2024-03-30 (×2): 40 mg via SUBCUTANEOUS
  Filled 2024-03-30 (×2): qty 0.4

## 2024-03-30 MED ORDER — AZITHROMYCIN 250 MG PO TABS: 250.0000 mg | ORAL_TABLET | Freq: Every day | ORAL | Status: DC

## 2024-03-30 MED ORDER — AZITHROMYCIN 250 MG PO TABS
250.0000 mg | ORAL_TABLET | Freq: Every day | ORAL | Status: DC
  Administered 2024-03-30: 250 mg via ORAL
  Filled 2024-03-30: qty 1

## 2024-03-30 MED ORDER — HYDROCHLOROTHIAZIDE 25 MG PO TABS
25.0000 mg | ORAL_TABLET | Freq: Every day | ORAL | Status: DC
  Administered 2024-03-30 – 2024-03-31 (×2): 25 mg via ORAL
  Filled 2024-03-30 (×2): qty 1

## 2024-03-30 MED ORDER — SODIUM CHLORIDE 0.9 % IV SOLN: 1.0000 g | INTRAVENOUS | Status: DC

## 2024-03-30 MED ORDER — ACETAMINOPHEN 325 MG PO TABS: 650.0000 mg | ORAL_TABLET | Freq: Four times a day (QID) | ORAL | Status: DC | PRN

## 2024-03-30 MED ORDER — ACETAMINOPHEN 650 MG RE SUPP: 650.0000 mg | Freq: Four times a day (QID) | RECTAL | Status: DC | PRN

## 2024-03-30 MED ORDER — IPRATROPIUM-ALBUTEROL 0.5-2.5 (3) MG/3ML IN SOLN
3.0000 mL | Freq: Four times a day (QID) | RESPIRATORY_TRACT | Status: DC
  Administered 2024-03-30 – 2024-03-31 (×4): 3 mL via RESPIRATORY_TRACT
  Filled 2024-03-30 (×3): qty 3
  Filled 2024-03-30: qty 6

## 2024-03-30 MED ORDER — ENOXAPARIN SODIUM 40 MG/0.4ML IJ SOSY: 40.0000 mg | PREFILLED_SYRINGE | INTRAMUSCULAR | Status: DC

## 2024-03-30 MED ORDER — IPRATROPIUM-ALBUTEROL 0.5-2.5 (3) MG/3ML IN SOLN: 3.0000 mL | RESPIRATORY_TRACT | Status: DC | PRN

## 2024-03-30 MED ORDER — FINASTERIDE 5 MG PO TABS
5.0000 mg | ORAL_TABLET | Freq: Every day | ORAL | Status: DC
  Administered 2024-03-30 – 2024-03-31 (×2): 5 mg via ORAL
  Filled 2024-03-30 (×2): qty 1

## 2024-03-30 NOTE — Assessment & Plan Note (Signed)
-   Continue steroids - Continue antibiotics, narrow to Azithromycin  - Continue scheduled Duoneb - Start loratadine

## 2024-03-30 NOTE — Hospital Course (Signed)
 68 y.o. M with COPD FEV1 39% in 2021, smoking, HTN who presented with increased cough, sputum and shortness of breath.  In the ER, was wheezing, hypoxic and placed on BiPAP for COPD flare.
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
## 2024-03-30 NOTE — Progress Notes (Signed)
  Progress Note   Patient: Derek Patterson FAO:[id number] DOB: [birth year] DOA: 03/29/2024     1 DOS: the patient was seen and examined on 03/30/2024 at 12:43 PM      Brief hospital course: 68 y.o. M with COPD FEV1 39% in 2021, smoking, HTN who presented with increased cough, sputum and shortness of breath.  In the ER, was wheezing, hypoxic and placed on BiPAP for COPD flare.      Assessment and Plan: * COPD with acute exacerbation (HCC) - Continue steroids - Continue antibiotics, narrow to Azithromycin  - Continue scheduled Duoneb - Start loratadine     Essential hypertension BP normal - Continue HCTZ  BPH (benign prostatic hyperplasia) - Continue finasteride           Subjective: Patient still coughing frequently, short of breath at rest.  Gradually improving.  Still very wheezy.     Physical Exam: BP 113/61 (BP Location: Right Arm)   Pulse 84   Temp (!) 97.5 F (36.4 C) (Axillary)   Resp 17   Ht 5\' 10"  (1.778 m)   Wt 63.5 kg   SpO2 96%   BMI 20.09 kg/m   Thin adult male, sitting up in bed, interactive and appropriate RRR, no murmurs, no peripheral edema Frequent coughing spells and is extremely short of breath afterwards, wheezing bilaterally Abdomen soft no tenderness palpation or guarding   Data Reviewed: PFTs from 2021 showed FEV1 39% COVID-negative Chest x-ray clear Basic metabolic panel normal CBC normal Troponin and lactic and BNP normal  Family Communication: None present    Disposition: Status is: Inpatient         Author: Ephriam Hashimoto, MD 03/30/2024 4:02 PM  For on call review [email or web address].

## 2024-03-30 NOTE — ED Notes (Signed)
 Charge RN approved for pt transport.

## 2024-03-30 NOTE — Assessment & Plan Note (Signed)
 BP normal - Continue HCTZ

## 2024-03-30 NOTE — Progress Notes (Signed)
 Pt taken off BIPAP prior to transport to 6E24. Pt sat 100% on RA, NAD. BIPAP also taken to pt room in case he needs it later. Receiving RT given report.

## 2024-03-30 NOTE — Assessment & Plan Note (Signed)
 -  Continue finasteride

## 2024-03-31 ENCOUNTER — Other Ambulatory Visit (HOSPITAL_COMMUNITY): Payer: Self-pay

## 2024-03-31 DIAGNOSIS — J441 Chronic obstructive pulmonary disease with (acute) exacerbation: Secondary | ICD-10-CM | POA: Diagnosis not present

## 2024-03-31 LAB — BASIC METABOLIC PANEL WITH GFR
Anion gap: 11 (ref 5–15)
BUN: 19 mg/dL (ref 8–23)
CO2: 26 mmol/L (ref 22–32)
Calcium: 9.7 mg/dL (ref 8.9–10.3)
Chloride: 102 mmol/L (ref 98–111)
Creatinine, Ser: 1.17 mg/dL (ref 0.61–1.24)
GFR, Estimated: >60 mL/min (ref >60)
Glucose, Bld: 132 mg/dL — ABNORMAL HIGH (ref 70–99)
Potassium: 4 mmol/L (ref 3.5–5.1)
Sodium: 139 mmol/L (ref 135–145)

## 2024-03-31 LAB — CBC
HCT: 41.8 % (ref 39.0–52.0)
Hemoglobin: 14 g/dL (ref 13.0–17.0)
MCH: 27.6 pg (ref 26.0–34.0)
MCHC: 33.5 g/dL (ref 30.0–36.0)
MCV: 82.4 fL (ref 80.0–100.0)
Platelets: 293 10*3/uL (ref 150–400)
RBC: 5.07 MIL/uL (ref 4.22–5.81)
RDW: 13.9 % (ref 11.5–15.5)
WBC: 19.2 10*3/uL — ABNORMAL HIGH (ref 4.0–10.5)
nRBC: 0 % (ref 0.0–0.2)

## 2024-03-31 MED ORDER — PREDNISONE 10 MG PO TABS
ORAL_TABLET | ORAL | 0 refills | Status: AC
  Filled 2024-03-31: qty 20, 8d supply, fill #0

## 2024-03-31 MED ORDER — LORATADINE 10 MG PO TABS
10.0000 mg | ORAL_TABLET | Freq: Every day | ORAL | 0 refills | Status: DC
  Filled 2024-03-31: qty 30, 30d supply, fill #0

## 2024-03-31 MED ORDER — IPRATROPIUM-ALBUTEROL 0.5-2.5 (3) MG/3ML IN SOLN: 3.0000 mL | Freq: Two times a day (BID) | RESPIRATORY_TRACT | Status: DC

## 2024-03-31 MED ORDER — AZITHROMYCIN 250 MG PO TABS
250.0000 mg | ORAL_TABLET | Freq: Every day | ORAL | 0 refills | Status: AC
  Filled 2024-03-31: qty 3, 3d supply, fill #0

## 2024-03-31 MED ORDER — FLUTICASONE FUROATE-VILANTEROL 200-25 MCG/ACT IN AEPB
2.0000 | INHALATION_SPRAY | Freq: Two times a day (BID) | RESPIRATORY_TRACT | 12 refills | Status: DC
  Filled 2024-03-31: qty 60, 15d supply, fill #0

## 2024-03-31 MED ORDER — GUAIFENESIN-DM 100-10 MG/5ML PO SYRP
5.0000 mL | ORAL_SOLUTION | ORAL | 0 refills | Status: DC | PRN
  Filled 2024-03-31: qty 118, 4d supply, fill #0

## 2024-03-31 MED ORDER — FLUTICASONE-SALMETEROL 115-21 MCG/ACT IN AERO
2.0000 | INHALATION_SPRAY | Freq: Two times a day (BID) | RESPIRATORY_TRACT | 12 refills | Status: DC
  Filled 2024-03-31: qty 12, 30d supply, fill #0

## 2024-03-31 MED ORDER — TIOTROPIUM BROMIDE MONOHYDRATE 18 MCG IN CAPS
18.0000 ug | ORAL_CAPSULE | Freq: Every day | RESPIRATORY_TRACT | 12 refills | Status: DC
  Filled 2024-03-31: qty 30, 30d supply, fill #0

## 2024-03-31 NOTE — Discharge Summary (Signed)
 Physician Discharge Summary   Patient: Derek Patterson MRN: [id number] DOB: 06-01-1956  Admit date:     03/29/2024  Discharge date: 03/31/24  Discharge Physician: Ephriam Hashimoto   PCP: Ulysees Gander, MD     Recommendations at discharge:  Follow up with PCP Dr. Mason Sole in 1 week for COPD flare Dr. Mason Sole: Please follow up new Advair /Spiriva  and refill/titrate to higher dose if appropriate Please ensure referral to Pulmonology Please follow-up smoking cessation, medication assisted therapy appropriate     Discharge Diagnoses: Principal Problem:   COPD with acute exacerbation (HCC) Active Problems:   Acute respiratory failure with hypoxia (HCC)   Essential hypertension   BPH (benign prostatic hyperplasia)      Hospital Course: 68 y.o. M with COPD FEV1 39% in 2021, smoking, HTN who presented with increased cough, sputum and shortness of breath.  In the ER, was wheezing, hypoxic and placed on BiPAP for COPD flare.       * COPD with acute exacerbation [facility]) Patient previously followed with pulmonology, was started on Trelegy.  He reports this causes him to feel more short of breath, and he does not feel that it is effective.  His current flare started a few weeks ago given pollen, and gradually got worse in the last few days until he was in severe distress, admitted on BiPAP.  Started on steroids, antibiotics, bronchodilators.  Chest x-ray clear.  He was weaned to room air over the course of 48 hours, clinically improved.  Discharged with prednisone  taper, 5 days azithromycin .  Trelegy stopped, changed to Advair  plus Spiriva  per insurance formulary.  May need to switch back to Trelegy if appropriate.    Tobacco use Smoking cessation recommended, patient motivated, modalities discussed.  Essential hypertension BP normal on HCTZ  BPH (benign prostatic hyperplasia) Stable on finasteride             The [place]  Controlled Substances Registry was  reviewed for this patient prior to discharge.   Disposition: Home Diet recommendation:  Discharge Diet Orders (From admission, onward)     Start     Ordered   03/31/24 0000  Diet - low sodium heart healthy        03/31/24 0950             DISCHARGE MEDICATION: Allergies as of 03/31/2024   No Known Allergies      Medication List     STOP taking these medications    Trelegy Ellipta  100-62.5-25 MCG/ACT Aepb Generic drug: Fluticasone -Umeclidin-Vilant       TAKE these medications    albuterol  108 (90 Base) MCG/ACT inhaler Commonly known as: VENTOLIN  HFA INHALE 1-2 PUFFS into THE lungs EVERY 4 HOURS FOR FOR WHEEZING OR SHORTNESS OF BREATH What changed: See the new instructions.   albuterol  (2.5 MG/3ML) 0.083% nebulizer solution Commonly known as: PROVENTIL  Take 2.5 mg by nebulization every 4 (four) hours as needed for wheezing or shortness of breath. What changed: Another medication with the same name was changed. Make sure you understand how and when to take each.   aspirin  EC 81 MG tablet Take 81 mg by mouth daily.   azithromycin  250 MG tablet Commonly known as: ZITHROMAX  Take 1 tablet (250 mg total) by mouth daily for 3 days.   CENTRUM SILVER 50+MEN PO Take 1 tablet by mouth daily.   finasteride  5 MG tablet Commonly known as: PROSCAR  Take 5 mg by mouth daily.   fluticasone -salmeterol 115-21 MCG/ACT inhaler Commonly known as: Advair   HFA Inhale 2 puffs into the lungs 2 (two) times daily.   guaiFENesin -dextromethorphan  100-10 MG/5ML syrup Commonly known as: ROBITUSSIN DM Take 5 mLs by mouth every 4 (four) hours as needed for cough.   hydrochlorothiazide  25 MG tablet Commonly known as: HYDRODIURIL  TAKE 1 TABLET BY MOUTH EVERY DAY   loratadine  10 MG tablet Commonly known as: CLARITIN  Take 1 tablet (10 mg total) by mouth daily. Start taking on: April 01, 2024   predniSONE  10 MG tablet Commonly known as: DELTASONE  Take 4 tablets (40 mg total) by  mouth daily with breakfast for 2 days, THEN 3 tablets (30 mg total) daily with breakfast for 2 days, THEN 2 tablets (20 mg total) daily with breakfast for 2 days, THEN 1 tablet (10 mg total) daily with breakfast for 2 days. Start taking on: April 01, 2024   tamsulosin  0.4 MG Caps capsule Commonly known as: FLOMAX  Take 0.4 mg by mouth daily. 30 mins after a meal.   tiotropium 18 MCG inhalation capsule Commonly known as: Spiriva  HandiHaler Place 1 capsule (18 mcg total) into inhaler and inhale daily.        Follow-up Information     Ulysees Gander, MD. Schedule an appointment as soon as possible for a visit in 1 week(s).   Specialty: Family Medicine Contact information: [address] [place] [address] [phone]                 Discharge Instructions     Diet - low sodium heart healthy   Complete by: As directed    Discharge instructions   Complete by: As directed    **IMPORTANT DISCHARGE INSTRUCTIONS**   From Dr. Darlyn Eke: You were admitted for a COPD flare  You were treated here with steroids and antibiotics and bronchodilators  The steroids and antibiotics reduce inflammation You should finish the steroid (prednisone ) with a 8 day taper You should finish the antibiotics with 3 more days azithromycin  250 mg once daily  The "bronchodilators" are the breathing treatments that open the airways For the next week, use your albuterol  three times daily  Since Trelegy doesn't work well, change to Advair  and Spiriva  STOP Trelegy  START the new inhalers Advair  and Spiriva  (These two inhalers, contain similar medicines to Trelegy, just in a combination)  In addition, take the cough syrup dextromethorphan -guaifenesin  up to four times daily  And take the allergy/pollen medicine loratadine  10 mg once daily until allergy season is over in a month Go see your primary doctor in 1 week   Increase activity slowly   Complete by: As directed        Discharge  Exam: Filed Weights   03/29/24 2153 03/30/24 0100  Weight: 63.5 kg 63.5 kg    General: Pt is alert, awake, not in acute distress Cardiovascular: RRR, nl S1-S2, no murmurs appreciated.   No LE edema.   Respiratory: Normal respiratory rate and rhythm.  Lung sounds diminished, wheezes decreased, no rales Abdominal: Abdomen soft and non-tender.  No distension or HSM.   Neuro/Psych: Strength symmetric in upper and lower extremities.  Judgment and insight appear normal.   Condition at discharge: good  The results of significant diagnostics from this hospitalization (including imaging, microbiology, ancillary and laboratory) are listed below for reference.   Imaging Studies: DG Chest Portable 1 View Result Date: 03/29/2024 CLINICAL DATA:  Dyspnea. EXAM: PORTABLE CHEST 1 VIEW COMPARISON:  May 07, 2023 FINDINGS: The heart size and mediastinal contours are within normal limits. The lungs  are hyperinflated with evidence of emphysematous lung disease. There is no evidence of an acute infiltrate, pleural effusion or pneumothorax. The visualized skeletal structures are unremarkable. IMPRESSION: COPD without acute or active cardiopulmonary disease. Electronically Signed   By: Virgle Grime M.D.   On: 03/29/2024 23:54    Microbiology: Results for orders placed or performed during the hospital encounter of 03/29/24  Resp panel by RT-PCR (RSV, Flu A&B, Covid) Anterior Nasal Swab     Status: None   Collection Time: 03/29/24 10:02 PM   Specimen: Anterior Nasal Swab  Result Value Ref Range Status   SARS Coronavirus 2 by RT PCR NEGATIVE NEGATIVE Final   Influenza A by PCR NEGATIVE NEGATIVE Final   Influenza B by PCR NEGATIVE NEGATIVE Final    Comment: (NOTE) The Xpert Xpress SARS-CoV-2/FLU/RSV plus assay is intended as an aid in the diagnosis of influenza from Nasopharyngeal swab specimens and should not be used as a sole basis for treatment. Nasal washings and aspirates are unacceptable for Xpert  Xpress SARS-CoV-2/FLU/RSV testing.  Fact Sheet for Patients: BloggerCourse.com  Fact Sheet for Healthcare Providers: SeriousBroker.it  This test is not yet approved or cleared by the [place]  FDA and has been authorized for detection and/or diagnosis of SARS-CoV-2 by FDA under an Emergency Use Authorization (EUA). This EUA will remain in effect (meaning this test can be used) for the duration of the COVID-19 declaration under Section 564(b)(1) of the Act, 21 U.S.C. section 360bbb-3(b)(1), unless the authorization is terminated or revoked.     Resp Syncytial Virus by PCR NEGATIVE NEGATIVE Final    Comment: (NOTE) Fact Sheet for Patients: BloggerCourse.com  Fact Sheet for Healthcare Providers: SeriousBroker.it  This test is not yet approved or cleared by the [place]  FDA and has been authorized for detection and/or diagnosis of SARS-CoV-2 by FDA under an Emergency Use Authorization (EUA). This EUA will remain in effect (meaning this test can be used) for the duration of the COVID-19 declaration under Section 564(b)(1) of the Act, 21 U.S.C. section 360bbb-3(b)(1), unless the authorization is terminated or revoked.  Performed at [facility], [address] [place], [place] [address]   Blood Culture (routine x 2)     Status: None (Preliminary result)   Collection Time: 03/29/24 10:02 PM   Specimen: BLOOD RIGHT ARM  Result Value Ref Range Status   Specimen Description BLOOD RIGHT ARM  Final   Special Requests   Final    BOTTLES DRAWN AEROBIC AND ANAEROBIC Blood Culture adequate volume   Culture   Final    NO GROWTH 2 DAYS Performed at [facility], [address], [place], [place] [address]    Report Status PENDING  Incomplete  Expectorated Sputum Assessment w Gram Stain, Rflx to Resp Cult     Status: None   Collection Time: 03/30/24 12:13 AM    Specimen: Expectorated Sputum  Result Value Ref Range Status   Specimen Description EXPECTORATED SPUTUM  Final   Special Requests NONE  Final   Sputum evaluation   Final    THIS SPECIMEN IS ACCEPTABLE FOR SPUTUM CULTURE Performed at [facility], [address], [place], [place] [address]    Report Status 03/30/2024 FINAL  Final  Culture, Respiratory w Gram Stain     Status: None (Preliminary result)   Collection Time: 03/30/24 12:13 AM  Result Value Ref Range Status   Specimen Description EXPECTORATED SPUTUM  Final   Special Requests NONE Reflexed from [id number]  Final   Gram Stain   Final    RARE WBC PRESENT, PREDOMINANTLY PMN RARE GRAM POSITIVE COCCI Performed at [facility], [address], [place], [place] [address]    Culture PENDING  Incomplete   Report Status PENDING  Incomplete  Blood Culture (routine x 2)     Status: None (Preliminary result)   Collection Time: 03/30/24  5:50 AM   Specimen: BLOOD RIGHT ARM  Result Value Ref Range Status   Specimen Description BLOOD RIGHT ARM  Final   Special Requests   Final    BOTTLES DRAWN AEROBIC AND ANAEROBIC Blood Culture adequate volume   Culture   Final    NO GROWTH 1 DAY Performed at [facility], [address], [place], [place] [address]    Report Status PENDING  Incomplete    Labs: CBC: Recent Labs  Lab 03/29/24 2152 03/30/24 0550 03/31/24 0633  WBC 8.5 3.5* 19.2*  NEUTROABS 3.9  --   --   HGB 15.7 14.0 14.0  HCT 49.0 42.8 41.8  MCV 85.5 83.3 82.4  PLT 305 278 293   Basic Metabolic Panel: Recent Labs  Lab 03/29/24 2152 03/30/24 0550 03/31/24 0633  NA 137 137 139  K 4.5 4.0 4.0  CL 103 102 102  CO2 24 26 26   GLUCOSE 116* 162* 132*  BUN 14 16 19   CREATININE 0.96 1.14 1.17  CALCIUM  9.1 9.7 9.7   Liver Function Tests: Recent Labs  Lab 03/29/24 2152  AST 47*  ALT 28  ALKPHOS 92  BILITOT 1.4*  PROT 7.2  ALBUMIN 4.3   CBG: No results for input(s): "GLUCAP" in the last 168  hours.  Discharge time spent: approximately 45 minutes spent on discharge counseling, evaluation of patient on day of discharge, and coordination of discharge planning with nursing, social work, pharmacy and case management  Signed: Ephriam Hashimoto, MD [facility] 03/31/2024

## 2024-03-31 NOTE — Progress Notes (Signed)
SATURATION QUALIFICATIONS: (This note is used to comply with regulatory documentation for home oxygen)  Patient Saturations on Room Air at Rest = 99%  Patient Saturations on Room Air while Ambulating = 94%

## 2024-04-02 LAB — CULTURE, RESPIRATORY W GRAM STAIN

## 2024-04-03 ENCOUNTER — Ambulatory Visit (HOSPITAL_COMMUNITY)
Admission: RE | Admit: 2024-04-03 | Discharge: 2024-04-03 | Disposition: A | Discharge status: Home / Self Care | Source: Ambulatory Visit | Attending: Family Medicine | Admitting: Family Medicine

## 2024-04-03 DIAGNOSIS — T17908A Unspecified foreign body in respiratory tract, part unspecified causing other injury, initial encounter: Secondary | ICD-10-CM | POA: Diagnosis not present

## 2024-04-03 DIAGNOSIS — R1314 Dysphagia, pharyngoesophageal phase: Secondary | ICD-10-CM | POA: Insufficient documentation

## 2024-04-03 DIAGNOSIS — R1310 Dysphagia, unspecified: Secondary | ICD-10-CM

## 2024-04-03 LAB — CULTURE, BLOOD (ROUTINE X 2)
Culture: NO GROWTH
Special Requests: ADEQUATE

## 2024-04-03 NOTE — Progress Notes (Signed)
 Modified Barium Swallow Study  Patient Details  Name: Derek Patterson MRN: [id number] Date of Birth: 1956/12/07  Today's Date: 04/03/2024  Modified Barium Swallow completed.  Full report located under Chart Review in the Imaging Section.  History of Present Illness Pt refered by ENT for MBSS to assess for pharyngeal swallow function and safety. Initial evaluation by ENT 01/2024 reporting sx of dysphagia c/b challenges with solid textures, unexpected weight loss (15-20 lbs) which pt attributes to dental extraction. Laryngoscopy performed, unremarkable. Pt recently had esophagram (March 2025) and demonstrated phayrngeal residue and silent aspiration indicating potential pharyngeal dysphagia and primary peristalsis resutling in clear bolus progression. 03/29/2024 Chest XR demonstrating COPD but no active disease.   Clinical Impression The pt presents with mild oropharyngeal dysphagia accepting trials of thin, mildly thick, moderately thick liquids, puree, solids, and pill with thin liquids. During the oral phase, pt demonstrated timely and efficient mastication. Trace amounts of oral residue noted with pt using spontaneous re-swallow to manage. During the pharyngeal stage, swallow notable for decreased hyoid excursion and laryngeal elevation. Pt's swallow displayed minimal inversion of epiglottis with liquids, improved with puree and solid d/t increased viscosity. This resulted in valleculae reside which was not cleared with re-swallow or effortful swallow attempts. Also noted pharyngeal residue remaining in the pyriform sinuses after swallowing, indicating reduced distension of UES. Multiple swallows to clear were mildly effective for clearing residue in pyriform sinuses. Rare trace and transient penetration seen with thin liquids. Brief esophageal sweep revealed complete clearance of the esophagus during the esophageal sweep. SLP recommends continuing regular diet d/t functional swallow overall and following  up with outpatient SLP to begin strengthening exercises for improved swallow function. Additional recommendations include multiple swallows after bites, effortful swallow to aid clearance of pharyngeal residue, intermittent coughing after drinking and limiting distractions when eating to reduce risk of silent aspiration. Factors that may increase risk of adverse event in presence of aspiration [name] & [name] 2021): Inadequate oral hygiene;Respiratory or GI disease  Swallow Evaluation Recommendations Recommendations: PO diet PO Diet Recommendation: Regular Liquid Administration via: Cup Medication Administration: Whole meds with liquid Supervision: Patient able to self-feed Swallowing strategies  : Slow rate;effortful swallow;Multiple dry swallows after each bite/sip;Minimize environmental distractions Postural changes: Position pt fully upright for meals Oral care recommendations: Oral care BID (2x/day)      [name] 04/03/2024,1:12 PM

## 2024-04-04 LAB — CULTURE, BLOOD (ROUTINE X 2)
Culture: NO GROWTH
Special Requests: ADEQUATE

## 2024-06-06 ENCOUNTER — Encounter (HOSPITAL_COMMUNITY): Payer: Self-pay | Admitting: Interventional Radiology

## 2024-12-29 ENCOUNTER — Telehealth: Payer: Self-pay

## 2024-12-29 ENCOUNTER — Other Ambulatory Visit (HOSPITAL_COMMUNITY): Payer: Self-pay

## 2024-12-29 ENCOUNTER — Ambulatory Visit: Admitting: Student

## 2024-12-29 ENCOUNTER — Encounter: Payer: Self-pay | Admitting: Student

## 2024-12-29 ENCOUNTER — Other Ambulatory Visit: Payer: Self-pay

## 2024-12-29 VITALS — BP 111/76 | HR 91 | Temp 97.6°F | Ht 70.0 in | Wt 136.2 lb

## 2024-12-29 DIAGNOSIS — J449 Chronic obstructive pulmonary disease, unspecified: Secondary | ICD-10-CM | POA: Diagnosis not present

## 2024-12-29 DIAGNOSIS — F17200 Nicotine dependence, unspecified, uncomplicated: Secondary | ICD-10-CM

## 2024-12-29 DIAGNOSIS — Z7951 Long term (current) use of inhaled steroids: Secondary | ICD-10-CM | POA: Diagnosis not present

## 2024-12-29 DIAGNOSIS — Z79899 Other long term (current) drug therapy: Secondary | ICD-10-CM | POA: Diagnosis not present

## 2024-12-29 DIAGNOSIS — Z720 Tobacco use: Secondary | ICD-10-CM | POA: Diagnosis not present

## 2024-12-29 DIAGNOSIS — N182 Chronic kidney disease, stage 2 (mild): Secondary | ICD-10-CM

## 2024-12-29 DIAGNOSIS — R911 Solitary pulmonary nodule: Secondary | ICD-10-CM | POA: Diagnosis not present

## 2024-12-29 DIAGNOSIS — E7800 Pure hypercholesterolemia, unspecified: Secondary | ICD-10-CM | POA: Diagnosis not present

## 2024-12-29 DIAGNOSIS — Z136 Encounter for screening for cardiovascular disorders: Secondary | ICD-10-CM

## 2024-12-29 DIAGNOSIS — I10 Essential (primary) hypertension: Secondary | ICD-10-CM

## 2024-12-29 DIAGNOSIS — I129 Hypertensive chronic kidney disease with stage 1 through stage 4 chronic kidney disease, or unspecified chronic kidney disease: Secondary | ICD-10-CM

## 2024-12-29 MED ORDER — VARENICLINE TARTRATE 0.5 MG PO TABS: ORAL_TABLET | ORAL | 0 refills | Status: AC

## 2024-12-29 MED ORDER — VARENICLINE TARTRATE 1 MG PO TABS: 1.0000 mg | ORAL_TABLET | Freq: Two times a day (BID) | ORAL | 0 refills | Status: AC

## 2024-12-29 MED ORDER — ALBUTEROL SULFATE HFA 108 (90 BASE) MCG/ACT IN AERS: 1.0000 | INHALATION_SPRAY | RESPIRATORY_TRACT | 6 refills | Status: AC | PRN

## 2024-12-29 MED ORDER — HYDROCHLOROTHIAZIDE 25 MG PO TABS: 25.0000 mg | ORAL_TABLET | Freq: Every day | ORAL | 11 refills | Status: AC

## 2024-12-29 MED ORDER — TAMSULOSIN HCL 0.4 MG PO CAPS: 0.4000 mg | ORAL_CAPSULE | Freq: Every day | ORAL | 11 refills | Status: AC

## 2024-12-29 MED ORDER — FINASTERIDE 5 MG PO TABS: 5.0000 mg | ORAL_TABLET | Freq: Every day | ORAL | 11 refills | Status: AC

## 2024-12-29 MED ORDER — ALBUTEROL SULFATE (2.5 MG/3ML) 0.083% IN NEBU: 2.5000 mg | INHALATION_SOLUTION | RESPIRATORY_TRACT | 2 refills | Status: AC | PRN

## 2024-12-29 MED ORDER — ASPIRIN 81 MG PO TBEC: 81.0000 mg | DELAYED_RELEASE_TABLET | Freq: Every day | ORAL | 11 refills | Status: AC

## 2024-12-29 MED ORDER — TRELEGY ELLIPTA 100-62.5-25 MCG/ACT IN AEPB: 1.0000 | INHALATION_SPRAY | Freq: Every day | RESPIRATORY_TRACT | 11 refills | Status: AC

## 2024-12-29 NOTE — Patient Instructions (Addendum)
 Derek Patterson, Thank you for allowing me to take part in your care today.  Here are your instructions.  1.  I am going to check your cholesterol today.  I will call you with results.  2.  I have refilled all your medications  3.  Please come back in 3 months for COPD follow-up as well as high blood pressure follow-up  4.  I have ordered for ultrasound of your aorta to evaluate for aneurysm.  I have also ordered a CT scan to evaluate for your lung nodule.  Please await phone call to schedule.   PLEASE BRING YOUR MEDICATIONS TO EVERY APPOINTMENT  Thank you, Dr. Tobie  If you have any other questions please contact the internal medicine clinic at [phone] If it is after hours, please call the [facility] at [phone] and then ask the person who picks up for the resident on call.

## 2024-12-29 NOTE — Assessment & Plan Note (Signed)
 Blood pressure measuring well today at 111/76.  Patient is currently on hydrochlorothiazide  25 mg daily.  No acute concerns today.  Plan: - Continue hydrochlorothiazide  25 mg

## 2024-12-29 NOTE — Progress Notes (Signed)
 Internal Medicine Clinic Attending  Case discussed with the resident at the time of the visit.  We reviewed the resident's history and exam and pertinent patient test results.  I agree with the assessment, diagnosis, and plan of care documented in the resident's note.

## 2024-12-29 NOTE — Telephone Encounter (Signed)
 COPD : RN Telephone Call   Patient ID: Derek Patterson, male    DOB: April 06, 1956  Age: 69 y.o. MRN: [id number]  [name] is a 69 y.o. who was called about COPD management.   Patient reports that they are taking the following medications for COPD: yes   Patient reports they DO or DO NOT have a prior pulmonologist who they follow:  treated  by  our clinic   Please document smoking status, how many packs? Ready to quit ?  - Smoking Hx: He currently smokes 1 -2 cigs  per day.   mMRC (Modified Medical Research Council) Dyspnea Scale   Ask the following questions and please check the box that describes the most.   Choose ONE>   []  Grade 0: I only get breathless with strenuous exercise   []  Grade 1: I get short of breath when hurry on level ground or walking up a slight hill   []  Grade 2: On level Ground, I walk slower than people of the same age because of breathlessness or have to stop for breath when walking my own pace   [x]  Grade 3: I Stop for breath after walking about 100 yards or after a few minutes on level ground   []  Grade 4: I am too breathless to leave the house, or I am breathless when dressing    CAT (COPD Assessment Test)   Please ask each of the following questions and select between 0-5 based on what describes the patient most accurately.  On a scale 0 to 5, do you never cough or cough all the time: 2  5 (My chest is completely full of phlegm) 2 5 (When I walk up a hill or one flight of stairs I am very breathless) 5 (I am very limited doing any activities at home  0 (I am confident leaving my home despite my lung condition) 2 On a scale 0 to 5, how would you describe your energy?: 2  Total: 18

## 2024-12-29 NOTE — Assessment & Plan Note (Addendum)
 Past medical history of hyperlipidemia.  Patient has not had a lipid panel in quite some time.  Will evaluate today.  Previously on atorvastatin , but did not tolerate well and there was recommendation for Crestor , but that has not been followed up.  Will evaluate with lipid panel today.  If elevated LDL will likely need statin.  Plan: - Follow-up lipid panel - If not at goal, will initiate Crestor , already discussed this with patient
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
## 2024-12-29 NOTE — Progress Notes (Signed)
 COPD inhaler education requested by PCP during OV 12/29/24. Patient had Trelegy and Albuterol  inhaler with him. Demonstrated incorrect inhaler technique with Trelegy Ellipta , as he was taking 3 quick puffs rather than 1 deep puff and holding his breath, which could result in obtaining a lower dose of medication. Patient was educated on correct technique and was able to demonstrate via teach-back method. He demonstrated correct use of Albuterol  inhaler, but was advised to wait about 1 minute in between puffs.   Patient was educated on the importance of daily adherence to his Trelegy Ellipta , as he stated he sometimes would intentionally skip a dose. His dose counter was at 1 today. Patient states he has 2 additional inhalers at home. He noted that breathing was worse in the evening. He usually took inhaler first this in the morning around 7:30AM. He was concerned that the effect was wearing off by the evening. Educated that it is supposed to work for 24 hours, but if he wanted to try moving his daily dose later in the day, that could help.   All questions answered. Discussed with PCP that if patient continues to feel Trelegy inhaler is ineffective, due to not being able to take a deep enough inspiration, Breztri is also covered by insurance.   Lorain Baseman, PharmD [facility] [phone]

## 2024-12-29 NOTE — Assessment & Plan Note (Addendum)
 Patient has a past medical history of COPD.  Patient was diagnosed in December 2020.  PFT showed FEV1/FVC ratio of 49.  Patient has had multiple COPD exacerbation hospitalizations.  His current medications include Trelegy as well as albuterol .  Inhaler teaching given today.  Encourage patient to remain adherent to medication regimen.  He agreed.  Had him meet with our pharmacist Dr. Brinda who taught him his inhaler techniques.  Encouraged him to quit smoking which he agreed to as well.  Plan: - Inhaler teaching provided - Will write Chantix  for tobacco cessation - Continue Trelegy 1 puff daily - Continue albuterol  as needed - mMRC grade 3, CAT score 18 - Follow-up in 3 months

## 2024-12-29 NOTE — Assessment & Plan Note (Addendum)
 CT chest in Aug 2023 that showed a left upper lobe pulmonary nodule.  Will order CT scan to follow-up.  Plan: - Follow-up CT

## 2024-12-29 NOTE — Assessment & Plan Note (Addendum)
 Assessed patient's readiness for tobacco cessation today.  Patient states he is ready to quit.  He is agreeable to Chantix .  Plan: - Send in Chantix  - 6-minute spent on tobacco cessation counseling

## 2024-12-30 ENCOUNTER — Ambulatory Visit: Payer: Self-pay | Admitting: Student

## 2024-12-30 LAB — LIPID PANEL
Chol/HDL Ratio: 2.3 ratio (ref 0.0–5.0)
Cholesterol, Total: 170 mg/dL (ref 100–199)
HDL: 74 mg/dL
LDL Chol Calc (NIH): 85 mg/dL (ref 0–99)
Triglycerides: 58 mg/dL (ref 0–149)
VLDL Cholesterol Cal: 11 mg/dL (ref 5–40)

## 2024-12-30 MED ORDER — ROSUVASTATIN CALCIUM 10 MG PO TABS: 10.0000 mg | ORAL_TABLET | Freq: Every day | ORAL | 11 refills | Status: AC

## 2025-01-14 ENCOUNTER — Ambulatory Visit (HOSPITAL_COMMUNITY)

## 2025-01-30 ENCOUNTER — Ambulatory Visit (HOSPITAL_COMMUNITY)

## 2025-03-30 ENCOUNTER — Ambulatory Visit: Payer: Self-pay
